# Patient Record
Sex: Female | Born: 1965
Health system: Southern US, Community
[De-identification: ages and names within clinical notes are randomized; demographics above are authoritative.]

## PROBLEM LIST (undated history)

## (undated) DIAGNOSIS — E119 Type 2 diabetes mellitus without complications: Secondary | ICD-10-CM

## (undated) DIAGNOSIS — E559 Vitamin D deficiency, unspecified: Secondary | ICD-10-CM

## (undated) DIAGNOSIS — M109 Gout, unspecified: Secondary | ICD-10-CM

## (undated) DIAGNOSIS — J45909 Unspecified asthma, uncomplicated: Secondary | ICD-10-CM

## (undated) DIAGNOSIS — I10 Essential (primary) hypertension: Secondary | ICD-10-CM

## (undated) DIAGNOSIS — E049 Nontoxic goiter, unspecified: Secondary | ICD-10-CM

## (undated) DIAGNOSIS — E7800 Pure hypercholesterolemia, unspecified: Secondary | ICD-10-CM

## (undated) HISTORY — PX: CHOLECYSTECTOMY: SHX55

## (undated) HISTORY — PX: TUBAL LIGATION: SHX77

## (undated) HISTORY — DX: Vitamin D deficiency, unspecified: E55.9

## (undated) HISTORY — DX: Essential (primary) hypertension: I10

## (undated) HISTORY — DX: Pure hypercholesterolemia, unspecified: E78.00

## (undated) HISTORY — DX: Type 2 diabetes mellitus without complications: E11.9

## (undated) HISTORY — PX: KNEE ARTHROSCOPY: SUR90

---
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
## 2000-09-26 ENCOUNTER — Inpatient Hospital Stay (HOSPITAL_COMMUNITY): Admission: AD | Admit: 2000-09-26 | Discharge: 2000-09-26 | Payer: Self-pay | Admitting: Obstetrics & Gynecology

## 2001-01-03 ENCOUNTER — Encounter: Payer: Self-pay | Admitting: Emergency Medicine

## 2001-01-04 ENCOUNTER — Encounter: Payer: Self-pay | Admitting: Family Medicine

## 2001-01-05 ENCOUNTER — Encounter: Payer: Self-pay | Admitting: Family Medicine

## 2001-01-05 ENCOUNTER — Inpatient Hospital Stay (HOSPITAL_COMMUNITY): Admission: EM | Admit: 2001-01-05 | Discharge: 2001-01-09 | Payer: Self-pay | Admitting: Emergency Medicine

## 2001-01-06 ENCOUNTER — Encounter (INDEPENDENT_AMBULATORY_CARE_PROVIDER_SITE_OTHER): Payer: Self-pay | Admitting: *Deleted

## 2001-01-06 ENCOUNTER — Encounter: Payer: Self-pay | Admitting: Family Medicine

## 2001-01-18 ENCOUNTER — Encounter: Admission: RE | Admit: 2001-01-18 | Discharge: 2001-01-18 | Payer: Self-pay | Admitting: Family Medicine

## 2003-02-09 ENCOUNTER — Encounter: Payer: Self-pay | Admitting: Emergency Medicine

## 2003-02-09 ENCOUNTER — Emergency Department (HOSPITAL_COMMUNITY): Admission: EM | Admit: 2003-02-09 | Discharge: 2003-02-09 | Payer: Self-pay | Admitting: Emergency Medicine

## 2003-05-30 ENCOUNTER — Encounter: Admission: RE | Admit: 2003-05-30 | Discharge: 2003-08-28 | Payer: Self-pay | Admitting: Unknown Physician Specialty

## 2008-12-21 ENCOUNTER — Encounter: Admission: RE | Admit: 2008-12-21 | Discharge: 2008-12-21 | Payer: Self-pay | Admitting: Internal Medicine

## 2011-02-13 NOTE — Op Note (Signed)
[place]. [facility]  Patient:    Brandi Howell, Brandi Howell                   MRN: [id number] Proc. Date: 01/06/01 Adm. Date:  [id number] Attending:  Sanjuana Letters CC:         [facility], Dr. Leveda Anna   Operative Report  PREOPERATIVE DIAGNOSIS:  Symptomatic cholelithiasis with abnormal liver function tests.  POSTOPERATIVE DIAGNOSIS:  Symptomatic cholelithiasis with abnormal liver function tests.  PROCEDURE:  Laparoscopic cholecystectomy with intraoperative cholangiogram.  SURGEON:  Jimmye Norman, M.D.  ASSISTANT:  Abigail Miyamoto, M.D.  ANESTHESIA:  General endotracheal.  ESTIMATED BLOOD LOSS:  75 cc.  COMPLICATIONS:  Distal tear of cystic duct.  CONDITION:  Stable.  FINDINGS:  The patient had a normal cholangiogram.  However, during the procedure, there was a tear in distal cystic duct.  This required clips closer to the common duct than usually would have been done.  However, it appeared to be free of any impingement or tenting of the common duct.  INDICATIONS FOR OPERATION:  The patient is a 45 year old, who was admitted with abdominal pain and chest pain thought to be possibly cardiac in origin and was subsequently found to have cholelithiasis and abnormal liver function tests.  A HIDA scan was normal.  She is now being treated for her symptomatic gallbladder disease.  DESCRIPTION OF PROCEDURE:  The patient was taken to the operating room, placed on table in the supine position.  After an adequate endotracheal anesthetic was administered, she was prepped and draped in usual sterile manner exposing the midline and the right upper quadrant of the abdomen.  A supraumbilical curvilinear incision was made using a #11-blade and it was taken down to the midline fascia, through which a Veress needle was passed into the peritoneal cavity and confirmed to be in position using the saline test.  This was done with the patient in reverse  Trendelenburg position.  Once the Veress needle was confirmed to be in position, we insufflated the abdomen with carbon dioxide gas upto a maximal intraabdominal pressure of 15 mmHg. Once this was done, an 1112 mm cannula and trocar was passed through the supraumbilical fascia into the peritoneal cavity and confirmed to be in adequate position using a laparoscope with attached camera and light source.  Once the supraumbilical cannula was in place, two right costal margin 5 mm cannulae and a subxiphoid 1011 mm cannula were passed into the peritoneal cavity under direct vision.  With all in place, the gallbladder surgery was then begun.  A ratcheted grasper was passed through the lateral-most 5 mm cannula and attached to the dome of the gallbladder.  Because of the patients size, it was difficult getting adequate visualization of the infundibulum.  However, we could see well enough so to in order to dissect out the peritoneum overlying the Triangle of Calot and hepatoduodenal triangle.  The cystic artery was encountered initially and it was external and anterior to the cystic duct.  We were able to isolate that and ligate it doubly proximally and distally and then subsequently transect it.  We then dissected out the cystic duct and subsequently clipped it along the gallbladder side, performed a cholecystoductotomy and passed a redic catheter through the anterior abdominal wall in the cholecystoductotomy in order to perform cholangiogram.  During the initial attempt to inflate the balloon, the balloon popped and then subsequently there was a saline leak distal to the insertion site of the  catheter.  We attempted cholangiogram.  However, there was too much leakage. We demonstrated that there was a hole in the cystic duct distally.  We were able to get the catheter distal to the leak area and perform cholangiogram. However, we had to place our clips far more distal because there continued  to be a bile leak in the distal cystic duct.  We did demonstrate that there was a long cystic duct remnant during the procedure and that the catheter tip did not enter into the common bile duct.  Once the cholangiogram was performed, we removed the cholangiocatheter and then clipped proximal to the area where there was biliary leakage.  There was no further leakage and no apparent impingement upon the common bile duct, which we saw circumferentially common bile duct.  We did not see the common bile duct circumferentially.  However, we did see the distal cystic duct circumferentially and showed that there was no impingement.  We transected the cystic duct and then subsequently dissected out the gallbladder from its bed with minimal difficulty.  We were able to bring it out through the supraumbilical fascia, having retrieved some stones and then we repaired the supraumbilical fascia using a figure-of-eight stitch of 0 Vicryl.  Once this was done, we placed a 19 mm Blake drain through the medial-most cannula at the subxiphoid side, brought it down and passed into Morisons pouch.  We cut it to the appropriate length and then brought it out through the lateral-most 5 mm cannula site.  This was because of the leakage of the distal cystic duct that we were attempting to drain if it should leak postoperatively.  All needle counts, sponge counts and instrument counts were correct.  Once we had put the drain in place, we secured it with a 3-0 nylon.  We then closed the skin using running subcuticular stitch of 4-0 Vicryl; 0.25% Marcaine with epinephrine was injected at all skin sites.   Sterile dressings were applied to the Steri-Strips and sutured wounds. DD:  01/06/01 TD:  01/06/01 Job: [id number]

## 2011-02-13 NOTE — Discharge Summary (Signed)
[place]. [facility]  Patient:    Brandi Howell, Brandi Howell                   MRN: [id number] Adm. Date:  [id number] Disc. Date: 01/09/01 Attending:  Sanjuana Letters                           Discharge Summary  DISCHARGE DIAGNOSIS:  Acute gallbladder disease with biliary colic.  PRINCIPAL PROCEDURE:  Laparoscopic cholecystectomy with intraoperative cholangiogram.  The patient also had a partial cardiac work-up for possible chest pain.  DISCHARGE MEDICATION:  Vicodin 1-2 every 4 hours p.r.n. for pain.  The Jackson-Pratt drain was removed prior to discharge.  Had no bilious drainage; however, it was placed because of possible leak from the distal cystic duct.  DIET ON DISCHARGE:  Regular.  CONDITION:  Stable.  Her wounds look fine.  No evidence of infection.  She is to return to see me in two weeks.  HISTORY OF PRESENT ILLNESS:  The patient was admitted on the 8th initially for a cardiac work-up for epigastric and chest pain and subsequently found to have abnormal liver function tests, where an ultrasound was done which showed thickened gallbladder wall and gallstones.  A HIDA scan did show visualization of the gallbladder; however, because of acute symptoms, the patient was taken to surgery the day after a HIDA scan, at which time laparoscopic cholecystectomy was performed.  During the procedure, the Reddick cholangiocath perforated the distal cystic duct; however, we were able to complete a cholangiogram showing flow proximally and distally.  We were able to get clips below what appeared to be the perforation in the cystic duct distally; however, JP drain was placed postoperatively which never drained any bile and was placed a Morisons pouch.  The drain was discontinued the day of the patients discharge.  Initial liver function tests, which had been mildly abnormal, increased up into the 300s, but then came back down into the normal range.  Her  bilirubin never was elevated and neither was her alkaline phosphatase.  At the time of discharge, she is awake, alert, and oriented, complaining of less abdominal pain, had a normal bowel movement, and her wounds looked fine.  She will return to see me in two weeks.DD:  01/09/01 TD:  01/09/01 Job: [id number]

## 2011-02-13 NOTE — H&P (Signed)
[place]. [facility]  Patient:    Brandi Howell, Brandi Howell                   MRN: [id number] Adm. Date:  [id number] Attending:  Sanjuana Letters                         History and Physical  CHIEF COMPLAINT: The patient is a 45 year old woman I was asked to see by the family practice service for symptomatic cholelithiasis.  HISTORY OF PRESENT ILLNESS: She was admitted on January 04, 2001 with abdominal pain and epigastric and chest pain, thought to perhaps be cardiac in origin; however, she ruled out for any cardiac disease.  Her liver function tests were noted to be abnormal and ultrasound was done which showed stones, and a HIDA scan done which did fill the gallbladder, and the patient is currently asymptomatic.  However, because of previous pain she would like to go ahead with cholecystectomy.  PAST SURGICAL HISTORY:  1. Bilateral tubal ligation.  2. Laparoscopic ablation of endometriosis.  PAST MEDICAL HISTORY: She is otherwise healthy.  She previously did have hypertension which needed to be treated but currently is on no medication.  MEDICATIONS: She does not take medications for heart disease.  FAMILY HISTORY: Hypertension.  PHYSICAL EXAMINATION: Her abdomen is distended.  ABDOMEN: Distended.  She has good bowel sounds and has no tenderness in the right upper quadrant or epigastrium.  LABORATORY DATA: Her liver function tests are mildly abnormal, with AST and ALT elevated.  Total bilirubin is normal.  Alkaline phosphatase is normal.  She has thickening of the gallbladder on ultrasound but a normal HIDA.  IMPRESSION: The likelihood is that the patient has biliary colic and maybe mild subacute cholecystitis.  PLAN: The plan is to perform laparoscopic cholecystectomy.  This will be done as soon as possible tomorrow.  She will be added on as a case after getting some preoperative prophylactic antibiotics. DD:  01/05/01 TD:  01/06/01 Job:  [id number]

## 2011-02-13 NOTE — Discharge Summary (Signed)
[place]. [facility]  Patient:    Brandi Howell, Brandi Howell                     MRN: [id number] Adm. Date:  01/04/01 Disc. Date: 01/09/01 Attending:  Durward Mallard L. Mardelle Matte, M.D. Dictator:   Ocie Doyne, M.D. CC:         Fayette Pho, M.D., [place], [place]  Jimmye Norman, M.D.   Discharge Summary  DISCHARGE DIAGNOSIS:  Symptomatic cholelithiasis, status post laparoscopic cholecystectomy.  DISCHARGE MEDICATIONS: 1. Zantac 75 mg p.o. p.r.n. 2. Vicodin 1-2 p.o. q.4h. p.r.n. pain #30 given.  PROCEDURES:  Laparoscopic cholecystectomy with intraoperative cholangiogram.  CONSULTATIONS:  Dr. Jimmye Norman, general surgeon.  HISTORY OF PRESENT ILLNESS:  This 45 year old African-American female with history of gastroesophageal reflux disease and lactose intolerance presented with chest pain.  She had been in her usual state of good health when she ate an ice cream sandwich, immediately after which she began having squeezing 10/10 chest pain accompanied by diaphoresis and shortness of breath.  The pain lasted for 30-40 minutes, until her arrival at the emergency department.  The pain radiated to her back but not the jaw, shoulder, or arm.  Her pain lessened slightly after receiving a GI cocktail.  She has a history of similar chest pain in the past lasting 5-10 minutes not associated with particular activities or eating, which can occur at rest.  This has been going on for approximately 2-3 months.  She takes Zantac occasionally for these symptoms with some relief.  She does not smoke or drink alcohol.  Her physical exam was unremarkable.  ADMISSION LABORATORY DATA:  BMET:  Sodium 138, potassium 4.4, chloride 108, bicarb 22, BUN 15, creatinine 0.8, glucose 111.  CBC:  WBC 8.9, H&H 11.7 and 35.9, platelets 226.  Cardiac enzymes:  CK total of 340, CK-MB 2.5, troponin less than 0.01.  EKG showed question ST depression in leads II, III, and aVF while the patient was pain free.   Portable chest x-ray showed no acute disease.  HOSPITAL COURSE: #1 - CARDIOVASCULAR:  The patient was admitted to telemetry and serial cardiac enzymes were followed, which were negative.  A followup EKG the next morning showed no changes.  Fasting lipid panel was collected.  Total cholesterol 189, triglycerides 142, HDL 37, LDL 124.  She was ruled out for cardiovascular cause of her chest pain.  #2 - GASTROINTESTINAL:  A complete metabolic panel the following morning revealed AST elevated at 130, ALT 159, alkaline phosphatase 69, and total bilirubin of 0.5.  The patient continued to deny use of alcohol.  A viral hepatitis panel was negative.  Amylase and lipase were within normal limits. The patients pain recurred after eating breakfast and an ultrasound of the gallbladder revealed a thickened gallbladder wall at 3 mm in size and numerous gallstones with no biliary ductal dilatation.  A radionuclide hepatobiliary scan was performed.  There was no evidence of cystic, ductal, or biliary obstruction.  A general surgeon, Dr. Lindie Spruce, was consulted to see the patient for symptomatic cholelithiasis and the patient elected to have cholecystectomy performed.  On January 06, 2001, she underwent laparoscopic cholecystectomy with intraoperative cholangiogram complicated by a distal tear of the cystic duct. The patient tolerated the procedure well and her postoperative course was unremarkable.  On postop day #2, her liver enzymes remained mildly elevated. AST 56, ALT 162, alkaline phosphatase 91, total bilirubin 0.6, direct bilirubin 0.1, indirect bilirubin 0.5, albumin 3.2, total protein 6.4.  By  postoperative day #3, she was tolerating a regular diet, had had a bowel movement, was ambulating, and pain was adequately controlled on p.o. medications.  She noted some mild erythema around one of the incisional sites. There was no drainage, no warmth, no tenderness to palpation, and she was discharged home  in stable condition.  FOLLOW-UP:  Follow-up will be with Dr. Lindie Spruce two weeks postoperatively.  The patient will call to schedule, and with Dr. Elesa Massed, her general practitioner, for hospital follow-up as needed. DD:  01/09/01 TD:  01/09/01 Job: [id number]

## 2011-07-14 ENCOUNTER — Ambulatory Visit (INDEPENDENT_AMBULATORY_CARE_PROVIDER_SITE_OTHER): Payer: BC Managed Care – PPO | Admitting: Vascular Surgery

## 2011-07-14 DIAGNOSIS — M79609 Pain in unspecified limb: Secondary | ICD-10-CM

## 2011-07-14 DIAGNOSIS — I70219 Atherosclerosis of native arteries of extremities with intermittent claudication, unspecified extremity: Secondary | ICD-10-CM

## 2011-08-04 ENCOUNTER — Encounter (HOSPITAL_BASED_OUTPATIENT_CLINIC_OR_DEPARTMENT_OTHER): Payer: BC Managed Care – PPO

## 2014-10-12 ENCOUNTER — Other Ambulatory Visit: Payer: Self-pay

## 2014-10-12 DIAGNOSIS — Z1231 Encounter for screening mammogram for malignant neoplasm of breast: Secondary | ICD-10-CM

## 2014-10-24 ENCOUNTER — Ambulatory Visit
Admission: RE | Admit: 2014-10-24 | Discharge: 2014-10-24 | Disposition: A | Discharge status: Home / Self Care | Payer: 59 | Source: Ambulatory Visit

## 2014-10-24 ENCOUNTER — Other Ambulatory Visit: Payer: Self-pay

## 2014-10-24 ENCOUNTER — Encounter (INDEPENDENT_AMBULATORY_CARE_PROVIDER_SITE_OTHER): Payer: Self-pay

## 2014-10-24 DIAGNOSIS — Z1231 Encounter for screening mammogram for malignant neoplasm of breast: Secondary | ICD-10-CM

## 2015-12-04 ENCOUNTER — Other Ambulatory Visit: Payer: Self-pay

## 2015-12-04 DIAGNOSIS — Z1231 Encounter for screening mammogram for malignant neoplasm of breast: Secondary | ICD-10-CM

## 2015-12-18 ENCOUNTER — Ambulatory Visit
Admission: RE | Admit: 2015-12-18 | Discharge: 2015-12-18 | Disposition: A | Discharge status: Home / Self Care | Payer: BLUE CROSS/BLUE SHIELD | Source: Ambulatory Visit

## 2015-12-18 DIAGNOSIS — Z1231 Encounter for screening mammogram for malignant neoplasm of breast: Secondary | ICD-10-CM

## 2016-01-15 DIAGNOSIS — I10 Essential (primary) hypertension: Secondary | ICD-10-CM | POA: Diagnosis not present

## 2016-01-15 DIAGNOSIS — Z0000 Encounter for general adult medical examination without abnormal findings: Secondary | ICD-10-CM | POA: Diagnosis not present

## 2016-01-15 DIAGNOSIS — E1165 Type 2 diabetes mellitus with hyperglycemia: Secondary | ICD-10-CM | POA: Diagnosis not present

## 2016-01-15 DIAGNOSIS — N390 Urinary tract infection, site not specified: Secondary | ICD-10-CM | POA: Diagnosis not present

## 2016-04-17 DIAGNOSIS — R609 Edema, unspecified: Secondary | ICD-10-CM | POA: Diagnosis not present

## 2016-04-17 DIAGNOSIS — I10 Essential (primary) hypertension: Secondary | ICD-10-CM | POA: Diagnosis not present

## 2016-04-17 DIAGNOSIS — E1165 Type 2 diabetes mellitus with hyperglycemia: Secondary | ICD-10-CM | POA: Diagnosis not present

## 2016-07-22 DIAGNOSIS — Z79899 Other long term (current) drug therapy: Secondary | ICD-10-CM | POA: Diagnosis not present

## 2016-07-22 DIAGNOSIS — I10 Essential (primary) hypertension: Secondary | ICD-10-CM | POA: Diagnosis not present

## 2016-07-22 DIAGNOSIS — E1165 Type 2 diabetes mellitus with hyperglycemia: Secondary | ICD-10-CM | POA: Diagnosis not present

## 2016-07-22 DIAGNOSIS — R609 Edema, unspecified: Secondary | ICD-10-CM | POA: Diagnosis not present

## 2016-08-19 DIAGNOSIS — Z6841 Body Mass Index (BMI) 40.0 and over, adult: Secondary | ICD-10-CM | POA: Diagnosis not present

## 2016-08-19 DIAGNOSIS — Z1389 Encounter for screening for other disorder: Secondary | ICD-10-CM | POA: Diagnosis not present

## 2016-08-19 DIAGNOSIS — E1165 Type 2 diabetes mellitus with hyperglycemia: Secondary | ICD-10-CM | POA: Diagnosis not present

## 2016-08-19 DIAGNOSIS — R609 Edema, unspecified: Secondary | ICD-10-CM | POA: Diagnosis not present

## 2016-10-05 DIAGNOSIS — J180 Bronchopneumonia, unspecified organism: Secondary | ICD-10-CM | POA: Diagnosis not present

## 2016-10-05 DIAGNOSIS — I517 Cardiomegaly: Secondary | ICD-10-CM | POA: Diagnosis not present

## 2016-10-05 DIAGNOSIS — R079 Chest pain, unspecified: Secondary | ICD-10-CM | POA: Diagnosis not present

## 2016-10-06 DIAGNOSIS — I517 Cardiomegaly: Secondary | ICD-10-CM | POA: Diagnosis not present

## 2016-10-23 DIAGNOSIS — I10 Essential (primary) hypertension: Secondary | ICD-10-CM | POA: Diagnosis not present

## 2016-10-23 DIAGNOSIS — R079 Chest pain, unspecified: Secondary | ICD-10-CM | POA: Diagnosis not present

## 2016-10-23 DIAGNOSIS — E7800 Pure hypercholesterolemia, unspecified: Secondary | ICD-10-CM | POA: Diagnosis not present

## 2016-10-23 DIAGNOSIS — R0602 Shortness of breath: Secondary | ICD-10-CM | POA: Diagnosis not present

## 2016-11-05 DIAGNOSIS — I10 Essential (primary) hypertension: Secondary | ICD-10-CM | POA: Diagnosis not present

## 2016-11-05 DIAGNOSIS — R0602 Shortness of breath: Secondary | ICD-10-CM | POA: Diagnosis not present

## 2017-03-11 ENCOUNTER — Other Ambulatory Visit: Payer: Self-pay | Admitting: Family Medicine

## 2017-03-11 ENCOUNTER — Other Ambulatory Visit: Payer: Self-pay | Admitting: Internal Medicine

## 2017-03-11 DIAGNOSIS — E1165 Type 2 diabetes mellitus with hyperglycemia: Secondary | ICD-10-CM | POA: Diagnosis not present

## 2017-03-11 DIAGNOSIS — M79602 Pain in left arm: Secondary | ICD-10-CM | POA: Diagnosis not present

## 2017-03-11 DIAGNOSIS — M791 Myalgia: Secondary | ICD-10-CM | POA: Diagnosis not present

## 2017-03-11 DIAGNOSIS — I10 Essential (primary) hypertension: Secondary | ICD-10-CM | POA: Diagnosis not present

## 2017-03-11 DIAGNOSIS — Z1231 Encounter for screening mammogram for malignant neoplasm of breast: Secondary | ICD-10-CM

## 2017-03-11 DIAGNOSIS — M79605 Pain in left leg: Secondary | ICD-10-CM | POA: Diagnosis not present

## 2017-03-16 DIAGNOSIS — N920 Excessive and frequent menstruation with regular cycle: Secondary | ICD-10-CM | POA: Diagnosis not present

## 2017-03-16 DIAGNOSIS — M25562 Pain in left knee: Secondary | ICD-10-CM | POA: Diagnosis not present

## 2017-03-16 DIAGNOSIS — Z124 Encounter for screening for malignant neoplasm of cervix: Secondary | ICD-10-CM | POA: Diagnosis not present

## 2017-03-16 DIAGNOSIS — Z1231 Encounter for screening mammogram for malignant neoplasm of breast: Secondary | ICD-10-CM | POA: Diagnosis not present

## 2017-03-16 DIAGNOSIS — Z01419 Encounter for gynecological examination (general) (routine) without abnormal findings: Secondary | ICD-10-CM | POA: Diagnosis not present

## 2017-03-23 ENCOUNTER — Ambulatory Visit: Payer: BLUE CROSS/BLUE SHIELD

## 2017-03-23 DIAGNOSIS — M9903 Segmental and somatic dysfunction of lumbar region: Secondary | ICD-10-CM | POA: Diagnosis not present

## 2017-03-23 DIAGNOSIS — M9902 Segmental and somatic dysfunction of thoracic region: Secondary | ICD-10-CM | POA: Diagnosis not present

## 2017-03-23 DIAGNOSIS — M545 Low back pain: Secondary | ICD-10-CM | POA: Diagnosis not present

## 2017-03-23 DIAGNOSIS — M9904 Segmental and somatic dysfunction of sacral region: Secondary | ICD-10-CM | POA: Diagnosis not present

## 2017-03-26 DIAGNOSIS — M9902 Segmental and somatic dysfunction of thoracic region: Secondary | ICD-10-CM | POA: Diagnosis not present

## 2017-03-26 DIAGNOSIS — M9904 Segmental and somatic dysfunction of sacral region: Secondary | ICD-10-CM | POA: Diagnosis not present

## 2017-03-26 DIAGNOSIS — M9903 Segmental and somatic dysfunction of lumbar region: Secondary | ICD-10-CM | POA: Diagnosis not present

## 2017-03-26 DIAGNOSIS — M545 Low back pain: Secondary | ICD-10-CM | POA: Diagnosis not present

## 2017-03-29 DIAGNOSIS — M25562 Pain in left knee: Secondary | ICD-10-CM | POA: Diagnosis not present

## 2017-03-29 DIAGNOSIS — M25462 Effusion, left knee: Secondary | ICD-10-CM | POA: Diagnosis not present

## 2017-03-29 DIAGNOSIS — S83242A Other tear of medial meniscus, current injury, left knee, initial encounter: Secondary | ICD-10-CM | POA: Diagnosis not present

## 2017-03-30 DIAGNOSIS — M545 Low back pain: Secondary | ICD-10-CM | POA: Diagnosis not present

## 2017-03-30 DIAGNOSIS — M9904 Segmental and somatic dysfunction of sacral region: Secondary | ICD-10-CM | POA: Diagnosis not present

## 2017-03-30 DIAGNOSIS — M9902 Segmental and somatic dysfunction of thoracic region: Secondary | ICD-10-CM | POA: Diagnosis not present

## 2017-03-30 DIAGNOSIS — M9903 Segmental and somatic dysfunction of lumbar region: Secondary | ICD-10-CM | POA: Diagnosis not present

## 2017-04-27 ENCOUNTER — Other Ambulatory Visit: Payer: Self-pay | Admitting: Orthopedic Surgery

## 2017-04-27 DIAGNOSIS — M25562 Pain in left knee: Secondary | ICD-10-CM

## 2017-05-08 ENCOUNTER — Ambulatory Visit
Admission: RE | Admit: 2017-05-08 | Discharge: 2017-05-08 | Disposition: A | Discharge status: Home / Self Care | Payer: BLUE CROSS/BLUE SHIELD | Source: Ambulatory Visit | Attending: Orthopedic Surgery | Admitting: Orthopedic Surgery

## 2017-05-08 DIAGNOSIS — M25562 Pain in left knee: Secondary | ICD-10-CM

## 2017-05-13 DIAGNOSIS — S83242D Other tear of medial meniscus, current injury, left knee, subsequent encounter: Secondary | ICD-10-CM | POA: Diagnosis not present

## 2017-05-13 DIAGNOSIS — M1711 Unilateral primary osteoarthritis, right knee: Secondary | ICD-10-CM | POA: Diagnosis not present

## 2017-06-03 DIAGNOSIS — E119 Type 2 diabetes mellitus without complications: Secondary | ICD-10-CM | POA: Diagnosis not present

## 2017-06-03 DIAGNOSIS — Z0000 Encounter for general adult medical examination without abnormal findings: Secondary | ICD-10-CM | POA: Diagnosis not present

## 2017-06-03 DIAGNOSIS — I10 Essential (primary) hypertension: Secondary | ICD-10-CM | POA: Diagnosis not present

## 2017-06-05 DIAGNOSIS — S83241A Other tear of medial meniscus, current injury, right knee, initial encounter: Secondary | ICD-10-CM | POA: Diagnosis not present

## 2017-07-21 ENCOUNTER — Ambulatory Visit (INDEPENDENT_AMBULATORY_CARE_PROVIDER_SITE_OTHER): Payer: BLUE CROSS/BLUE SHIELD | Admitting: Neurology

## 2017-07-21 ENCOUNTER — Encounter (INDEPENDENT_AMBULATORY_CARE_PROVIDER_SITE_OTHER): Payer: Self-pay

## 2017-07-21 ENCOUNTER — Encounter: Payer: Self-pay | Admitting: Neurology

## 2017-07-21 VITALS — BP 154/80 | HR 97 | Ht 63.0 in | Wt 308.0 lb

## 2017-07-21 DIAGNOSIS — E662 Morbid (severe) obesity with alveolar hypoventilation: Secondary | ICD-10-CM | POA: Diagnosis not present

## 2017-07-21 DIAGNOSIS — R351 Nocturia: Secondary | ICD-10-CM

## 2017-07-21 DIAGNOSIS — Z6841 Body Mass Index (BMI) 40.0 and over, adult: Secondary | ICD-10-CM

## 2017-07-21 DIAGNOSIS — G4719 Other hypersomnia: Secondary | ICD-10-CM | POA: Diagnosis not present

## 2017-07-21 DIAGNOSIS — R6889 Other general symptoms and signs: Secondary | ICD-10-CM | POA: Diagnosis not present

## 2017-07-21 DIAGNOSIS — R0683 Snoring: Secondary | ICD-10-CM

## 2017-07-21 DIAGNOSIS — J4520 Mild intermittent asthma, uncomplicated: Secondary | ICD-10-CM

## 2017-07-21 NOTE — Progress Notes (Signed)
SLEEP MED[facility]INE CLINIC   Provider:  Melvyn Novasarmen  Sanjuana Mruk, M D  Primary Care Physician:  Dorothyann PengSanders, Robyn, MD   Referring Provider: Dorothyann PengSanders, Robyn, MD    Chief Complaint  Patient presents with  . New Patient (Initial Visit)    pt alone rm 11. pt states that she wakes up with headaches, pt has been told she snores and sleep apnea runs in her family. pt states that she gets tired around 2 pm in afternoon.     HPI:  Brandi Howell is a 51 y.o. female , seen here as in a referral/ revisit  from Dr. Allyne GeeSanders for evaluation of sleep apnea.  Dr. Allyne GeeSanders referred this 51 year old African-American right-handed lady for asleep evaluation today on 07/21/2017. Brandi Howell has diabetes, morbid obesity, and is not perimenopausal yet. She carries also a diagnosis of essential hypertension, a hypercholesterolemia, and asthma.  Her main concern is that she becomes fatigued during the day feels sleepy and wakes up not rested and restored.She often wakes up with headaches,  dull throbbing headache that is present when she wakes up, but does not wake her out of sleep. She is sleepy while driving.   Sleep habits are as follows: She is usually watching TV before she goes to bed, and bedtime is around 11 PM, but she often has fallen asleep in her recliner watching TV. She then has difficulties going back to sleep in her bed. Her bedroom is cool quiet and dark.She usually falls asleep on multiple pillows in supine position almost reclined, but her husband has witnessed her to turn prone soon after 1 AM and sleep face down. She has to urinate every couple of hours , 3 times at night, and further wakes sometimes up without the urge to urinate, with a parched, dry mouth. She keeps a bottle of water next to her. She keeps a fan. She works from [employer] which means that she gets up at around 6.30 AM, Accompanied by a dull headache.She uses her phone is alarm, she does not wake up spontaneously. She has noted that if she can  sleep in ( on weekends)she gets a more severe headache. She does not take any naps during the day, and not on weekends either.   Sleep medical history and family sleep history:  Both parents and her daughter have OSA and CPAP.   Social history: Brandi Howell is married and the mother of 5 daughters, all adult. She has no grandchildren. She has no history of tobacco use, she does not drink alcohol, She drinks coffee 2-3 times a week, she drinks sodas 2-3 times a week. She also drinks ice tea for lunch, she does not exceed 2 caffeine beverages a day. No shift work history , she was always sleepy. 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   Review of Systems: Out of a complete 14 system review, the patient complains of only the following symptoms, and all other reviewed systems are negative.  The patient reports weight gain, fatigue, daytime sleepiness and snoring, morning headaches, swelling in both lower extremities, cough, wheezing and snoring, blurred vision, feeling hot at night, increased thirst. She endorsed apart medical history of hypertension, diabetes, high cholesterol and migraines as well as asthma,is status post tubal ligation, cholecystectomy and arthroscopic knee surgery on the right.  Epworth score 15/24  , Fatigue severity score 22  , depression score n/a    Social History   Social History  . Marital status: Married  Spouse name: N/A  . Number of children: N/A  . Years of education: N/A   Occupational History  . Not on file.   Social History Main Topics  . Smoking status: Not on file  . Smokeless tobacco: Not on file  . Alcohol use Not on file  . Drug use: Unknown  . Sexual activity: Not on file   Other Topics Concern  . Not on file   Social History Narrative  . No narrative on file    No family history on file.  Past Medical History:  Diagnosis Date  . Diabetes mellitus without complication (HCC)   . Hypercholesteremia   .  Hypertension   . Vitamin D deficiency     No past surgical history on file.  Current Outpatient Prescriptions  Medication Sig Dispense Refill  . allopurinol (ZYLOPRIM) 100 MG tablet TK 1 T PO  QD  5  . amLODipine-olmesartan (AZOR) 5-40 MG tablet Take 1 tablet by mouth daily.    . Arginine 500 MG CAPS Take 1 capsule by mouth 2 (two) times a week.    . furosemide (LASIX) 20 MG tablet TK 1 T PO D  5  . montelukast (SINGULAIR) 10 MG tablet TK 1 T PO QD  5  . Multiple Vitamins-Iron (MULTI-DAY PLUS IRON PO) Take by mouth.    Tula Nakayama 100-3.6 UNIT-MG/ML SOPN INJECT 30 UNITS SUBCUTANEOUSLY EVERY NIGHT  3   No current facility-administered medications for this visit.     Allergies as of 07/21/2017 - Review Complete 07/21/2017  Allergen Reaction Noted  . Lisinopril Cough 07/21/2017    Vitals: BP (!) 154/80   Pulse 97   Ht 5\' 3"  (1.6 m)   Wt (!) 308 lb (139.7 kg)   BMI 54.56 kg/m  Last Weight:  Wt Readings from Last 1 Encounters:  07/21/17 (!) 308 lb (139.7 kg)   WUJ:WJXB mass index is 54.56 kg/m.     Last Height:   Ht Readings from Last 1 Encounters:  07/21/17 5\' 3"  (1.6 m)    Physical exam:  General: The patient is awake, alert and appears not in acute distress. The patient is well groomed. Head: Normocephalic, atraumatic. Neck is supple. Mallampati 5  neck circumference:20 . Nasal airflow patent , TMJ click -not  evident . Retrognathia is seen. Biological teeth.  Cardiovascular:  Regular rate and rhythm , without  murmurs or carotid bruit, and without distended neck veins. Respiratory: Lungs are clear to auscultation. Skin:  Without evidence of edema, or rash Trunk: BMI is 55,    Neurologic exam : The patient is awake and alert, oriented to place and time.   Memory subjective described as intact.  Cranial nerves: Pupils are equal and briskly reactive to light. Funduscopic exam without evidence of pallor or edema. Extraocular movements  in vertical and horizontal  planes intact and without nystagmus. Visual fields by finger perimetry are intact. Hearing to finger rub intact.  Facial sensation intact to fine touch. Facial motor strength is symmetric and tongue and uvula move midline. Shoulder shrug was symmetrical.   Motor exam:  Normal tone, muscle bulk and symmetric strength in all extremities. Sensory:  Fine touch, pinprick and vibration were tested in all extremities. Proprioception tested in the upper extremities was normal. Coordination: Rapid alternating movements/  Finger-to-nose maneuver  normal without evidence of ataxia, dysmetria or tremor.  Gait and station: Patient walks without assistive device  Deep tendon reflexes: in the  upper and lower extremities are symmetric and  intact.    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 But she reportedly snores if at added symptom.  My concern is to establish if the patient may have obesity hypoventilation, which would be characterized as hypercapnia and hypoxemia during sleep. This may also explain her frequent morning headaches. These headaches light as the day goes on but are frequent companions in the morning.I would very much prefer to see the patient and attended sleep study with  hypercapnia measures, oximetry, and to split at an AHI of 20.  The patient was advised of the nature of the diagnosed disorder , the treatment options and the  risks for general health and wellness arising from not treating the condition.   I spent more than 50 minutes of face to face time with the patient.  Greater than 50% of time was spent in counseling and coordination of care. We have discussed the diagnosis and differential and I answered the patient's questions.    Plan:  Treatment plan and additional workup : SPLIT at AHI 20,  CO2 and SpO2 needed. I need max CO2 values, not averages- and total time at or above 50 torr.   Melvyn Novas, MD 07/21/2017, 3:10 PM  Certified in Neurology by ABPN Certified in Sleep Medicine by [facility] [address] [place], [place] [address]

## 2017-08-11 ENCOUNTER — Telehealth: Payer: Self-pay | Admitting: Neurology

## 2017-08-11 NOTE — Telephone Encounter (Signed)
We have attempted to call the patient 2 times to schedule sleep study. Patient has been unavailable at the phone numbers we have on file and has not returned our calls. At this point we will send a letter asking pt to please contact the sleep lab to schedule their sleep study. If patient calls back we will schedule them for their sleep study. ° °

## 2017-09-03 DIAGNOSIS — I10 Essential (primary) hypertension: Secondary | ICD-10-CM | POA: Diagnosis not present

## 2017-09-03 DIAGNOSIS — E119 Type 2 diabetes mellitus without complications: Secondary | ICD-10-CM | POA: Diagnosis not present

## 2017-09-03 DIAGNOSIS — J309 Allergic rhinitis, unspecified: Secondary | ICD-10-CM | POA: Diagnosis not present

## 2017-09-29 DIAGNOSIS — M545 Low back pain: Secondary | ICD-10-CM | POA: Diagnosis not present

## 2017-09-30 ENCOUNTER — Telehealth: Payer: Self-pay | Admitting: Neurology

## 2017-09-30 NOTE — Telephone Encounter (Signed)
We have attempted to call the patient 2 times to schedule sleep study. Patient has been unavailable at the phone numbers we have on file and has not returned our calls. At this point we will send a letter asking pt to please contact the sleep lab to schedule their sleep study. If patient calls back we will schedule them for their sleep study. ° °

## 2017-10-22 ENCOUNTER — Other Ambulatory Visit: Payer: Self-pay | Admitting: Orthopedic Surgery

## 2017-10-22 DIAGNOSIS — S83242A Other tear of medial meniscus, current injury, left knee, initial encounter: Secondary | ICD-10-CM | POA: Diagnosis present

## 2017-10-22 NOTE — H&P (Signed)
Brandi Howell is an 52 y.o. female.   Chief Complaint: Left Knee Pain  HPI: [name] is seen today for pain in her left knee which has a known medial meniscal tear, best seen on MRI scan that was done by Dr. Luiz BlareGraves.  Cortisone injection provided 1 week worth of pain relief.  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  In addition, she has known end-stage arthritis of the right knee, and it may require arthroplasty when her comorbidities are addressed.  Her BMI is over 50.  She is a diabetic with an A1c of 8.2, and also has a history of asthma that she says is well controlled.  Past Medical History:  Diagnosis Date  . Diabetes mellitus without complication (HCC)   . Hypercholesteremia   . Hypertension   . Vitamin D deficiency      No family history on file. Social History:  has no tobacco, alcohol, and drug history on file.  Allergies:  Allergies  Allergen Reactions  . Lisinopril Cough    No medications prior to admission.    No results found for this or any previous visit (from the past 48 hour(s)). No results found.  Review of Systems  Constitutional: Negative.   HENT: Positive for sinus pain.   Eyes: Negative.   Respiratory: Negative.   Cardiovascular:       HTN  Gastrointestinal: Negative.   Genitourinary: Negative.   Musculoskeletal: Positive for joint pain and myalgias.  Skin: Negative.   Neurological: Negative.   Endo/Heme/Allergies: Negative.   Psychiatric/Behavioral: Negative.     There were no vitals taken for this visit. Physical Exam  Constitutional: She is oriented to person, place, and time. She appears well-developed and well-nourished.  HENT:  Head: Normocephalic and atraumatic.  Eyes: Pupils are equal, round, and reactive to light.  Neck: Normal range of motion. Neck supple.  Cardiovascular: Intact distal pulses.  Respiratory: Effort normal.  Musculoskeletal: She exhibits tenderness.   Tender along the medial joint line of the left knee which lacks full extension.  Range of motion is 5/225 limited by adipose tissue.  McMurray's test reproduces pain with no palpable click.    Neurological: She is alert and oriented to person, place, and time.  Skin: Skin is warm and dry.  Psychiatric: She has a normal mood and affect. Her behavior is normal. Judgment and thought content normal.     Assessment/Plan Assess: Symptomatic medial meniscal tear and a 52 year old female who is failed conservative measures including observation, anti-inflammatory medicine and cortisone injection.  Plan: The risks and benefits of arthroscopic surgery discussed at length.  We will see her back at the time of surgical intervention which will include meniscectomy and debridement of chondromalacia.  Reviewing her chart she had a similar procedure on the right knee, in November 2014.    Dannielle BurnEric Tedrick Port, PA-C 10/22/2017, 11:37 AM

## 2017-10-22 NOTE — Progress Notes (Signed)
Pre-op instructions provided to pt according to pre-op checklist; please complete pt assessments on DOS. Pt made aware to stop taking  Aspirin,vitamins, fish oil, Arginine, Allegra-D and herbal medications. Do not take any NSAIDs ie: Ibuprofen, Advil, Naproxen (Aleve), Motrin, Excedrin Migraine, BC and Goody Powder or any medication containing Aspirin. Pt made aware to not take XULTOPHY injection DOS. Pt will have [facility] fax LOV note, CXR and EKG tomorrow. Pt made aware to check BG every 2 hours prior to arrival to hospital on DOS. Pt made aware to treat a BG < 70 with  4 ounces of apple or cranberry juice, wait 15 minutes after intervention to recheck BG, if BG remains < 70, call Short Stay unit to speak with a nurse. PT verbalized understanding of all pre-op instructions.

## 2017-10-25 ENCOUNTER — Encounter (HOSPITAL_COMMUNITY)
Admission: RE | Disposition: A | Discharge status: Home / Self Care | Payer: Self-pay | Source: Ambulatory Visit | Attending: Orthopedic Surgery

## 2017-10-25 ENCOUNTER — Other Ambulatory Visit: Payer: Self-pay

## 2017-10-25 ENCOUNTER — Ambulatory Visit (HOSPITAL_COMMUNITY): Payer: BLUE CROSS/BLUE SHIELD | Admitting: Certified Registered Nurse Anesthetist

## 2017-10-25 ENCOUNTER — Encounter (HOSPITAL_COMMUNITY): Payer: Self-pay | Admitting: *Deleted

## 2017-10-25 ENCOUNTER — Ambulatory Visit (HOSPITAL_COMMUNITY)
Admission: RE | Admit: 2017-10-25 | Discharge: 2017-10-25 | Disposition: A | Discharge status: Home / Self Care | Payer: BLUE CROSS/BLUE SHIELD | Source: Ambulatory Visit | Attending: Orthopedic Surgery | Admitting: Orthopedic Surgery

## 2017-10-25 DIAGNOSIS — J45909 Unspecified asthma, uncomplicated: Secondary | ICD-10-CM | POA: Diagnosis not present

## 2017-10-25 DIAGNOSIS — M94262 Chondromalacia, left knee: Secondary | ICD-10-CM | POA: Diagnosis not present

## 2017-10-25 DIAGNOSIS — I10 Essential (primary) hypertension: Secondary | ICD-10-CM | POA: Diagnosis not present

## 2017-10-25 DIAGNOSIS — Z79899 Other long term (current) drug therapy: Secondary | ICD-10-CM | POA: Insufficient documentation

## 2017-10-25 DIAGNOSIS — M23322 Other meniscus derangements, posterior horn of medial meniscus, left knee: Secondary | ICD-10-CM | POA: Diagnosis not present

## 2017-10-25 DIAGNOSIS — X58XXXA Exposure to other specified factors, initial encounter: Secondary | ICD-10-CM | POA: Diagnosis not present

## 2017-10-25 DIAGNOSIS — Y929 Unspecified place or not applicable: Secondary | ICD-10-CM | POA: Insufficient documentation

## 2017-10-25 DIAGNOSIS — E119 Type 2 diabetes mellitus without complications: Secondary | ICD-10-CM | POA: Diagnosis not present

## 2017-10-25 DIAGNOSIS — M1990 Unspecified osteoarthritis, unspecified site: Secondary | ICD-10-CM | POA: Diagnosis not present

## 2017-10-25 DIAGNOSIS — Z6841 Body Mass Index (BMI) 40.0 and over, adult: Secondary | ICD-10-CM | POA: Insufficient documentation

## 2017-10-25 DIAGNOSIS — Z7982 Long term (current) use of aspirin: Secondary | ICD-10-CM | POA: Diagnosis not present

## 2017-10-25 DIAGNOSIS — M1712 Unilateral primary osteoarthritis, left knee: Secondary | ICD-10-CM | POA: Diagnosis not present

## 2017-10-25 DIAGNOSIS — S83242A Other tear of medial meniscus, current injury, left knee, initial encounter: Secondary | ICD-10-CM | POA: Diagnosis not present

## 2017-10-25 DIAGNOSIS — E7800 Pure hypercholesterolemia, unspecified: Secondary | ICD-10-CM | POA: Diagnosis not present

## 2017-10-25 DIAGNOSIS — E559 Vitamin D deficiency, unspecified: Secondary | ICD-10-CM | POA: Insufficient documentation

## 2017-10-25 DIAGNOSIS — M2242 Chondromalacia patellae, left knee: Secondary | ICD-10-CM | POA: Diagnosis not present

## 2017-10-25 HISTORY — PX: KNEE ARTHROSCOPY: SHX127

## 2017-10-25 LAB — BASIC METABOLIC PANEL
ANION GAP: 14 (ref 5–15)
BUN: 8 mg/dL (ref 6–20)
CO2: 27 mmol/L (ref 22–32)
Calcium: 9.1 mg/dL (ref 8.9–10.3)
Chloride: 98 mmol/L — ABNORMAL LOW (ref 101–111)
Creatinine, Ser: 0.76 mg/dL (ref 0.44–1.00)
GFR calc Af Amer: >60 mL/min (ref >60)
GLUCOSE: 159 mg/dL — AB (ref 65–99)
POTASSIUM: 3.9 mmol/L (ref 3.5–5.1)
Sodium: 139 mmol/L (ref 135–145)

## 2017-10-25 LAB — HEMOGLOBIN A1C
Hgb A1c MFr Bld: 8.5 % — ABNORMAL HIGH (ref 4.8–5.6)
MEAN PLASMA GLUCOSE: 197.25 mg/dL

## 2017-10-25 LAB — CBC
HEMATOCRIT: 42.3 % (ref 36.0–46.0)
HEMOGLOBIN: 13 g/dL (ref 12.0–15.0)
MCH: 25 pg — ABNORMAL LOW (ref 26.0–34.0)
MCHC: 30.7 g/dL (ref 30.0–36.0)
MCV: 81.3 fL (ref 78.0–100.0)
Platelets: 185 10*3/uL (ref 150–400)
RBC: 5.2 MIL/uL — AB (ref 3.87–5.11)
RDW: 16.8 % — ABNORMAL HIGH (ref 11.5–15.5)
WBC: 7.3 10*3/uL (ref 4.0–10.5)

## 2017-10-25 LAB — GLUCOSE, CAPILLARY
GLUCOSE-CAPILLARY: 134 mg/dL — AB (ref 65–99)
Glucose-Capillary: 153 mg/dL — ABNORMAL HIGH (ref 65–99)

## 2017-10-25 LAB — POCT PREGNANCY, URINE: Preg Test, Ur: NEGATIVE

## 2017-10-25 SURGERY — ARTHROSCOPY, KNEE
Anesthesia: General | Site: Knee | Laterality: Left

## 2017-10-25 MED ORDER — FUROSEMIDE 10 MG/ML IJ SOLN
INTRAMUSCULAR | Status: AC
  Administered 2017-10-25: 10 mg
  Filled 2017-10-25: qty 4

## 2017-10-25 MED ORDER — FENTANYL CITRATE (PF) 100 MCG/2ML IJ SOLN
25.0000 ug | INTRAMUSCULAR | Status: DC | PRN
  Administered 2017-10-25: 25 ug via INTRAVENOUS
  Administered 2017-10-25: 50 ug via INTRAVENOUS

## 2017-10-25 MED ORDER — FUROSEMIDE 10 MG/ML IJ SOLN: 10.0000 mg | Freq: Once | INTRAMUSCULAR | Status: DC

## 2017-10-25 MED ORDER — LACTATED RINGERS IV SOLN
INTRAVENOUS | Status: DC
  Administered 2017-10-25: 11:00:00 via INTRAVENOUS

## 2017-10-25 MED ORDER — PROPOFOL 10 MG/ML IV BOLUS
INTRAVENOUS | Status: DC | PRN
  Administered 2017-10-25: 200 mg via INTRAVENOUS

## 2017-10-25 MED ORDER — FENTANYL CITRATE (PF) 250 MCG/5ML IJ SOLN
INTRAMUSCULAR | Status: AC
  Filled 2017-10-25: qty 5

## 2017-10-25 MED ORDER — BUPIVACAINE-EPINEPHRINE 0.5% -1:200000 IJ SOLN
INTRAMUSCULAR | Status: DC | PRN
  Administered 2017-10-25: 10 mL

## 2017-10-25 MED ORDER — PHENYLEPHRINE HCL 10 MG/ML IJ SOLN
INTRAMUSCULAR | Status: DC | PRN
  Administered 2017-10-25: 40 ug via INTRAVENOUS

## 2017-10-25 MED ORDER — ONDANSETRON HCL 4 MG/2ML IJ SOLN
INTRAMUSCULAR | Status: DC | PRN
  Administered 2017-10-25: 4 mg via INTRAVENOUS

## 2017-10-25 MED ORDER — DEXAMETHASONE SODIUM PHOSPHATE 4 MG/ML IJ SOLN
INTRAMUSCULAR | Status: DC | PRN
  Administered 2017-10-25: 5 mg via INTRAVENOUS

## 2017-10-25 MED ORDER — SODIUM CHLORIDE 0.9 % IR SOLN
Status: DC | PRN
  Administered 2017-10-25: 3000 mL

## 2017-10-25 MED ORDER — HYDROCODONE-ACETAMINOPHEN 5-325 MG PO TABS: 1.0000 | ORAL_TABLET | Freq: Four times a day (QID) | ORAL | 0 refills | Status: DC | PRN

## 2017-10-25 MED ORDER — CEFAZOLIN SODIUM-DEXTROSE 2-4 GM/100ML-% IV SOLN
2.0000 g | INTRAVENOUS | Status: AC
  Administered 2017-10-25: 2 g via INTRAVENOUS
  Filled 2017-10-25: qty 100

## 2017-10-25 MED ORDER — BUPIVACAINE-EPINEPHRINE (PF) 0.5% -1:200000 IJ SOLN
INTRAMUSCULAR | Status: AC
  Filled 2017-10-25: qty 30

## 2017-10-25 MED ORDER — STERILE WATER FOR IRRIGATION IR SOLN
Status: DC | PRN
  Administered 2017-10-25: 1000 mL

## 2017-10-25 MED ORDER — PROPOFOL 10 MG/ML IV BOLUS
INTRAVENOUS | Status: AC
  Filled 2017-10-25: qty 20

## 2017-10-25 MED ORDER — ONDANSETRON HCL 4 MG PO TABS: 4.0000 mg | ORAL_TABLET | Freq: Four times a day (QID) | ORAL | Status: DC | PRN

## 2017-10-25 MED ORDER — MIDAZOLAM HCL 2 MG/2ML IJ SOLN
INTRAMUSCULAR | Status: AC
  Filled 2017-10-25: qty 2

## 2017-10-25 MED ORDER — CHLORHEXIDINE GLUCONATE 4 % EX LIQD: 60.0000 mL | Freq: Once | CUTANEOUS | Status: DC

## 2017-10-25 MED ORDER — METOCLOPRAMIDE HCL 5 MG PO TABS: 5.0000 mg | ORAL_TABLET | Freq: Three times a day (TID) | ORAL | Status: DC | PRN

## 2017-10-25 MED ORDER — IPRATROPIUM-ALBUTEROL 0.5-2.5 (3) MG/3ML IN SOLN
3.0000 mL | Freq: Once | RESPIRATORY_TRACT | Status: AC
  Administered 2017-10-25: 3 mL via RESPIRATORY_TRACT

## 2017-10-25 MED ORDER — ONDANSETRON HCL 4 MG/2ML IJ SOLN: 4.0000 mg | Freq: Four times a day (QID) | INTRAMUSCULAR | Status: DC | PRN

## 2017-10-25 MED ORDER — LABETALOL HCL 5 MG/ML IV SOLN
10.0000 mg | Freq: Once | INTRAVENOUS | Status: AC
  Administered 2017-10-25: 15:00:00 via INTRAVENOUS

## 2017-10-25 MED ORDER — LABETALOL HCL 5 MG/ML IV SOLN
INTRAVENOUS | Status: AC
  Administered 2017-10-25: 10 mg
  Filled 2017-10-25: qty 4

## 2017-10-25 MED ORDER — MIDAZOLAM HCL 5 MG/5ML IJ SOLN
INTRAMUSCULAR | Status: DC | PRN
  Administered 2017-10-25: 2 mg via INTRAVENOUS

## 2017-10-25 MED ORDER — FENTANYL CITRATE (PF) 250 MCG/5ML IJ SOLN
INTRAMUSCULAR | Status: DC | PRN
  Administered 2017-10-25: 25 ug via INTRAVENOUS
  Administered 2017-10-25: 50 ug via INTRAVENOUS
  Administered 2017-10-25: 25 ug via INTRAVENOUS

## 2017-10-25 MED ORDER — FENTANYL CITRATE (PF) 100 MCG/2ML IJ SOLN
INTRAMUSCULAR | Status: AC
  Filled 2017-10-25: qty 2

## 2017-10-25 MED ORDER — EPINEPHRINE PF 1 MG/ML IJ SOLN
INTRAMUSCULAR | Status: AC
  Filled 2017-10-25: qty 1

## 2017-10-25 MED ORDER — LIDOCAINE HCL (CARDIAC) 20 MG/ML IV SOLN
INTRAVENOUS | Status: DC | PRN
  Administered 2017-10-25: 100 mg via INTRAVENOUS

## 2017-10-25 MED ORDER — LACTATED RINGERS IV SOLN
INTRAVENOUS | Status: DC | PRN
  Administered 2017-10-25: 11:00:00 via INTRAVENOUS

## 2017-10-25 MED ORDER — LABETALOL HCL 5 MG/ML IV SOLN
10.0000 mg | Freq: Once | INTRAVENOUS | Status: AC
  Administered 2017-10-25: 10 mg via INTRAVENOUS

## 2017-10-25 MED ORDER — EPINEPHRINE PF 1 MG/ML IJ SOLN
INTRAMUSCULAR | Status: DC | PRN
  Administered 2017-10-25: 1 mg

## 2017-10-25 MED ORDER — IPRATROPIUM-ALBUTEROL 0.5-2.5 (3) MG/3ML IN SOLN
RESPIRATORY_TRACT | Status: AC
  Administered 2017-10-25: 3 mL via RESPIRATORY_TRACT
  Filled 2017-10-25: qty 3

## 2017-10-25 MED ORDER — ONDANSETRON HCL 4 MG/2ML IJ SOLN: 4.0000 mg | Freq: Once | INTRAMUSCULAR | Status: DC | PRN

## 2017-10-25 MED ORDER — LABETALOL HCL 5 MG/ML IV SOLN
INTRAVENOUS | Status: AC
  Administered 2017-10-25: 10 mg via INTRAVENOUS
  Filled 2017-10-25: qty 4

## 2017-10-25 MED ORDER — METOCLOPRAMIDE HCL 5 MG/ML IJ SOLN: 5.0000 mg | Freq: Three times a day (TID) | INTRAMUSCULAR | Status: DC | PRN

## 2017-10-25 SURGICAL SUPPLY — 33 items
BANDAGE ACE 6X5 VEL STRL LF (GAUZE/BANDAGES/DRESSINGS) ×3 IMPLANT
BLADE CUDA 5.5 (BLADE) IMPLANT
BLADE CUTTER GATOR 3.5 (BLADE) ×3 IMPLANT
BLADE GREAT WHITE 4.2 (BLADE) IMPLANT
BLADE GREAT WHITE 4.2MM (BLADE)
BUR OVAL 6.0 (BURR) IMPLANT
DRAPE ARTHROSCOPY W/POUCH 114 (DRAPES) ×3 IMPLANT
DRSG PAD ABDOMINAL 8X10 ST (GAUZE/BANDAGES/DRESSINGS) ×3 IMPLANT
DURAPREP 26ML APPLICATOR (WOUND CARE) ×3 IMPLANT
GAUZE SPONGE 4X4 12PLY STRL (GAUZE/BANDAGES/DRESSINGS) ×3 IMPLANT
GAUZE XEROFORM 1X8 LF (GAUZE/BANDAGES/DRESSINGS) ×3 IMPLANT
GLOVE BIO SURGEON STRL SZ7.5 (GLOVE) ×3 IMPLANT
GLOVE BIO SURGEON STRL SZ8.5 (GLOVE) ×6 IMPLANT
GLOVE BIOGEL PI IND STRL 8 (GLOVE) ×2 IMPLANT
GLOVE BIOGEL PI IND STRL 9 (GLOVE) ×1 IMPLANT
GLOVE BIOGEL PI INDICATOR 8 (GLOVE) ×4
GLOVE BIOGEL PI INDICATOR 9 (GLOVE) ×2
GOWN STRL REUS W/ TWL LRG LVL3 (GOWN DISPOSABLE) ×3 IMPLANT
GOWN STRL REUS W/ TWL XL LVL3 (GOWN DISPOSABLE) ×2 IMPLANT
GOWN STRL REUS W/TWL LRG LVL3 (GOWN DISPOSABLE) ×6
GOWN STRL REUS W/TWL XL LVL3 (GOWN DISPOSABLE) ×4
KIT BASIN OR (CUSTOM PROCEDURE TRAY) ×3 IMPLANT
KIT ROOM TURNOVER OR (KITS) ×3 IMPLANT
MANIFOLD NEPTUNE II (INSTRUMENTS) ×3 IMPLANT
PACK ARTHROSCOPY DSU (CUSTOM PROCEDURE TRAY) ×3 IMPLANT
PAD ARMBOARD 7.5X6 YLW CONV (MISCELLANEOUS) ×6 IMPLANT
SET ARTHROSCOPY TUBING (MISCELLANEOUS) ×2
SET ARTHROSCOPY TUBING LN (MISCELLANEOUS) ×1 IMPLANT
SPONGE LAP 18X18 X RAY DECT (DISPOSABLE) ×3 IMPLANT
SPONGE LAP 4X18 X RAY DECT (DISPOSABLE) ×3 IMPLANT
SYR CONTROL 10ML LL (SYRINGE) ×3 IMPLANT
TOWEL OR 17X24 6PK STRL BLUE (TOWEL DISPOSABLE) ×3 IMPLANT
WATER STERILE IRR 1000ML POUR (IV SOLUTION) ×3 IMPLANT

## 2017-10-25 NOTE — Progress Notes (Signed)
Dr Desmond LopeUrk at bedside multiple times, Bipap on now, DR ROwan aware, Daughter in for visit and update, will also update Dr Desmond LopeUrk as to pts BP up

## 2017-10-25 NOTE — Anesthesia Preprocedure Evaluation (Addendum)
Anesthesia Evaluation  Patient identified by MRN, date of birth, ID band Patient awake    Reviewed: Allergy & Precautions, NPO status , Patient's Chart, lab work & pertinent test results  Airway Mallampati: II  TM Distance: >3 FB Neck ROM: Full    Dental  (+) Teeth Intact, Dental Advisory Given   Pulmonary asthma , sleep apnea ,    Pulmonary exam normal breath sounds clear to auscultation       Cardiovascular hypertension, Pt. on medications Normal cardiovascular exam Rhythm:Regular Rate:Normal     Neuro/Psych negative neurological ROS  negative psych ROS   GI/Hepatic negative GI ROS, Neg liver ROS, neg GERD  ,  Endo/Other  diabetes, Type obesity  Renal/GU negative Renal ROS     Musculoskeletal  (+) Arthritis , Osteoarthritis,    Abdominal   Peds  Hematology negative hematology ROS (+)   Anesthesia Other Findings Day of surgery medications reviewed with the patient.  Reproductive/Obstetrics                            Anesthesia Physical Anesthesia Plan  ASA: III  Anesthesia Plan: General   Post-op Pain Management:    Induction: Intravenous  PONV Risk Score and Plan: 3 and Dexamethasone and Ondansetron  Airway Management Planned: Oral ETT  Additional Equipment:   Intra-op Plan:   Post-operative Plan: Extubation in OR  Informed Consent: I have reviewed the patients History and Physical, chart, labs and discussed the procedure including the risks, benefits and alternatives for the proposed anesthesia with the patient or authorized representative who has indicated his/her understanding and acceptance.   Dental advisory given  Plan Discussed with: CRNA, Anesthesiologist and Surgeon  Anesthesia Plan Comments: (Risks/benefits of general anesthesia discussed with patient including risk of damage to teeth, lips, gum, and tongue, nausea/vomiting, allergic reactions to  medications, and the possibility of heart attack, stroke and death.  All patient questions answered.  Patient wishes to proceed.)       Anesthesia Quick Evaluation

## 2017-10-25 NOTE — Interval H&P Note (Signed)
History and Physical Interval Note:  10/25/2017 10:50 AM  Brandi Howell  has presented today for surgery, with the diagnosis of MEDIAL MENISCAL TEAR LEFT KNEE  The various methods of treatment have been discussed with the patient and family. After consideration of risks, benefits and other options for treatment, the patient has consented to  Procedure(s): LEFT KNEE ARTHROSCOPY WITH MEDIAL MENISECTOMY (Left) as a surgical intervention .  The patient's history has been reviewed, patient examined, no change in status, stable for surgery.  I have reviewed the patient's chart and labs.  Questions were answered to the patient's satisfaction.     Nestor LewandowskyFrank J Jermani Pund

## 2017-10-25 NOTE — Op Note (Signed)
Pre-Op Dx: Left knee medial meniscal tear, chondromalacia  Postop Dx: Same  Procedure: Left knee arthroscopic partial medial meniscectomy posterior horn and debridement chondromalacia medial femoral condyle grade 2-3  Surgeon: Feliberto GottronFrank J. Turner Danielsowan M.D.  Assist: Tomi LikensEric K. Gaylene BrooksPhillips PA-C  (present throughout entire procedure and necessary for timely completion of the procedure) Anes: General LMA  EBL: Minimal  Fluids: 800 cc   Indications: Catching popping and pain in the left knee similar to that she had in the right knee 3 years ago when she underwent arthroscopic partial medial meniscectomy.  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 Pt has failed conservative treatment with anti-inflammatory medicines, physical therapy, and modified activites but did get good temporarily from an intra-articular cortisone injection. Pain has recurred and patient desires elective arthroscopic evaluation and treatment of knee. Risks and benefits of surgery have been discussed and questions answered.  Procedure: Patient identified by arm band and taken to the operating room at the day surgery Center. The appropriate anesthetic monitors were attached, and General LMA anesthesia was induced without difficulty. Lateral post was applied to the table and the lower extremity was prepped and draped in usual sterile fashion from the ankle to the midthigh. Time out procedure was performed. We began the operation by making standard inferior lateral and inferior medial peripatellar portals with a #11 blade allowing introduction of the arthroscope through the inferior lateral portal and the out flow to the inferior medial portal. Pump pressure was set at 100 mmHg and diagnostic arthroscopy  revealed little if any chondromalacia of the patellofemoral joint.  Moving into the medial compartment identified a posterior horn radial tear of  the medial meniscus which was debrided back to a stable margin near the root.  Part of this was also parrot-beak 10 flipping in and out of the joint.  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  The ACL and the PCL are intact on the lateral side the patient did have a rather large lateral meniscus but it was not discoid.  In order to better visualize and contour I went ahead and removed the inner 20% of the anterior horn of the lateral meniscus.  The articular cartilages were in excellent condition there were no loose bodies.  The gutters were cleared medially and laterally.. The knee was irrigated out normal saline solution. A dressing of xerofoam 4 x 4 dressing sponges, web roll and an Ace wrap was applied. The patient was awakened extubated and taken to the recovery without difficulty.    Signed: Nestor LewandowskyFrank J Cherene Dobbins, MD

## 2017-10-25 NOTE — Anesthesia Postprocedure Evaluation (Signed)
Anesthesia Post Note  Patient: Brandi Howell  Procedure(s) Performed: LEFT KNEE ARTHROSCOPY WITH MEDIAL MENISECTOMY, CHONDROPLASTY (Left Knee)     Patient location during evaluation: PACU Anesthesia Type: General Level of consciousness: awake and alert Pain management: pain level controlled Vital Signs Assessment: post-procedure vital signs reviewed and stable Respiratory status: spontaneous breathing, nonlabored ventilation, respiratory function stable and patient connected to nasal cannula oxygen Cardiovascular status: blood pressure returned to baseline and stable Postop Assessment: no apparent nausea or vomiting Anesthetic complications: no Comments: PACU Course: low SpO2 readings despite patient denial of SOB, CP, difficulty breathing.  Received duoneb breathing treatment, Incentive spirometer, course of BiPAP, 1 dose of IV Lasix with improvement in SpO2 on room air.  Treated once for hypertension.  Discussion with patient about likely diagnosis of OSA.Marland Kitchen. Patient with sleep study scheduled next week.  Patient understands to continue use of IS and to take daily dose of anti-hypertensive medications once home.    Last Vitals:  Vitals:   10/25/17 1636 10/25/17 1640  BP: (!) 179/92   Pulse: 97   Resp: 14   Temp:  (!) 36.4 C  SpO2: 99%     Last Pain:  Vitals:   10/25/17 1534  TempSrc:   PainSc: 8                  Cecile HearingStephen Edward Merrilyn Legler

## 2017-10-25 NOTE — Progress Notes (Signed)
Patient SBP > 200, Dr. Desmond Lopeurk notified, new orders received. Labetalol given and BP rechecked with MD @ bedside. MD okay with current blood pressure at this time.

## 2017-10-25 NOTE — Anesthesia Procedure Notes (Signed)
Procedure Name: LMA Insertion Date/Time: 10/25/2017 11:38 AM Performed by: Margarita RanaHoltzman, Zerek Litsey Leffew, CRNA Pre-anesthesia Checklist: Patient identified, Emergency Drugs available, Suction available and Patient being monitored Patient Re-evaluated:Patient Re-evaluated prior to induction Oxygen Delivery Method: Circle System Utilized Preoxygenation: Pre-oxygenation with 100% oxygen Induction Type: IV induction Ventilation: Mask ventilation without difficulty LMA: LMA inserted LMA Size: 4.0 Number of attempts: 1 Airway Equipment and Method: Bite block Placement Confirmation: positive ETCO2 Tube secured with: Tape Dental Injury: Teeth and Oropharynx as per pre-operative assessment

## 2017-10-25 NOTE — Progress Notes (Signed)
SPoke with DR Desmond Lopeurk , sats [phone] , if pt maintains sats he states pt can go home

## 2017-10-25 NOTE — Transfer of Care (Signed)
Immediate Anesthesia Transfer of Care Note  Patient: Brandi Howell  Procedure(s) Performed: LEFT KNEE ARTHROSCOPY WITH MEDIAL MENISECTOMY (Left Knee)  Patient Location: PACU  Anesthesia Type:General  Level of Consciousness: awake, alert  and oriented  Airway & Oxygen Therapy: Patient Spontanous Breathing and Patient connected to nasal cannula oxygen  Post-op Assessment: Report given to RN, Post -op Vital signs reviewed and stable and Patient moving all extremities X 4  Post vital signs: Reviewed and stable  Last Vitals:  Vitals:   10/25/17 1116 10/25/17 1123  BP: (!) 215/104 (!) 177/79  Pulse:    Resp:    Temp:    SpO2:      Last Pain:  Vitals:   10/25/17 1112  TempSrc: Oral      Patients Stated Pain Goal: 2 (10/25/17 1055)  Complications: No apparent anesthesia complications

## 2017-10-26 ENCOUNTER — Encounter (HOSPITAL_COMMUNITY): Payer: Self-pay | Admitting: Orthopedic Surgery

## 2017-11-01 DIAGNOSIS — I10 Essential (primary) hypertension: Secondary | ICD-10-CM | POA: Diagnosis not present

## 2017-11-01 DIAGNOSIS — E1165 Type 2 diabetes mellitus with hyperglycemia: Secondary | ICD-10-CM | POA: Diagnosis not present

## 2017-11-01 DIAGNOSIS — Z79899 Other long term (current) drug therapy: Secondary | ICD-10-CM | POA: Diagnosis not present

## 2017-11-01 DIAGNOSIS — E041 Nontoxic single thyroid nodule: Secondary | ICD-10-CM | POA: Diagnosis not present

## 2017-11-07 ENCOUNTER — Ambulatory Visit (INDEPENDENT_AMBULATORY_CARE_PROVIDER_SITE_OTHER): Payer: BLUE CROSS/BLUE SHIELD | Admitting: Neurology

## 2017-11-07 DIAGNOSIS — G4733 Obstructive sleep apnea (adult) (pediatric): Secondary | ICD-10-CM

## 2017-11-07 DIAGNOSIS — E662 Morbid (severe) obesity with alveolar hypoventilation: Secondary | ICD-10-CM

## 2017-11-07 DIAGNOSIS — R6889 Other general symptoms and signs: Secondary | ICD-10-CM

## 2017-11-07 DIAGNOSIS — Z6841 Body Mass Index (BMI) 40.0 and over, adult: Secondary | ICD-10-CM

## 2017-11-07 DIAGNOSIS — G4719 Other hypersomnia: Secondary | ICD-10-CM

## 2017-11-07 DIAGNOSIS — R0683 Snoring: Secondary | ICD-10-CM

## 2017-11-07 DIAGNOSIS — R351 Nocturia: Secondary | ICD-10-CM

## 2017-11-08 ENCOUNTER — Other Ambulatory Visit: Payer: Self-pay | Admitting: Internal Medicine

## 2017-11-08 DIAGNOSIS — E041 Nontoxic single thyroid nodule: Secondary | ICD-10-CM

## 2017-11-14 ENCOUNTER — Other Ambulatory Visit: Payer: Self-pay | Admitting: Neurology

## 2017-11-14 DIAGNOSIS — E662 Morbid (severe) obesity with alveolar hypoventilation: Secondary | ICD-10-CM

## 2017-11-14 DIAGNOSIS — G4734 Idiopathic sleep related nonobstructive alveolar hypoventilation: Secondary | ICD-10-CM

## 2017-11-14 DIAGNOSIS — G4733 Obstructive sleep apnea (adult) (pediatric): Secondary | ICD-10-CM

## 2017-11-14 DIAGNOSIS — Z6841 Body Mass Index (BMI) 40.0 and over, adult: Secondary | ICD-10-CM

## 2017-11-14 NOTE — Procedures (Signed)
PATIENT'S NAME:  Brandi Howell, Brandi Howell DOB:      1966-07-20      MR#:    [id number]     DATE OF RECORDING: 11/07/2017 REFERRING M.D.:  Dorothyann Peng, M.D. Study Performed:  Split-Night Titration Study HISTORY:  Dr. Allyne Gee referred this 52 year old African-American right-handed lady for a sleep evaluation on 07/21/2017. [name] has diabetes, morbid obesity, gout, essential hypertension, hypercholesterolemia, and asthma. Her main concern is that she becomes very fatigued during the day, feels excessively daytime sleepy and wakes up not rested and restored. She often wakes up with headaches, dull throbbing headache that is present when she wakes up, but does not wake her out of sleep. She is very thirsty and sleepy while driving. The patient endorsed the Epworth Sleepiness Scale at 15/24 points, The FSS at 22 points,   The patient's weight 308 pounds with a height of 63 (inches), resulting in a BMI of 54.7 kg/m2. The patient's neck circumference measured 20 inches.  CURRENT MEDICATIONS: Allopurinol, Amlodipine, Arginine, Furosemide, Montelukast, and Xultophy  PROCEDURE:  This is a multichannel digital polysomnogram utilizing the Somnostar 11.2 system.  Electrodes and sensors were applied and monitored per AASM Specifications.   EEG, EOG, Chin and Limb EMG, were sampled at 200 Hz.  ECG, Snore and Nasal Pressure, Thermal Airflow, Respiratory Effort, CPAP Flow and Pressure, Oximetry was sampled at 50 Hz. Digital video and audio were recorded.      BASELINE STUDY WITHOUT CPAP RESULTS:Lights Out was at 22:34 and Lights On at 05:24.  Total recording time (TRT) was 250.5, with a total sleep time (TST) of 83 minutes.   The patient's sleep latency was 80 minutes.  REM latency was 0 minutes.  The sleep efficiency was 33.1 %.    SLEEP ARCHITECTURE: WASO (Wake after sleep onset) was 117.5 minutes, Stage N1 was 6 minutes, Stage N2 was 77 minutes, Stage N3 was 0 minutes and Stage R (REM sleep) was 0 minutes.   The percentages were Stage N1 7.2%, Stage N2 92.8%, Stage N3 0% and Stage R (REM sleep) 0%.   RESPIRATORY ANALYSIS:  There were a total of 204 respiratory events:  80 obstructive apneas, 0 central apneas and 0 mixed apneas with a total of 80 apneas and an apnea index (AI) of 57.8. There were 124 hypopneas with a hypopnea index of 89.6. The patient also had 0 respiratory event related arousals (RERAs).  Snoring was noted.    The total APNEA/HYPOPNEA INDEX (AHI) was 147.5 /hour and the total RESPIRATORY DISTURBANCE INDEX was 147.5 /hour in all NREM sleep. The patient spent all sleep time in the non-supine position with a non-supine AHI of 147.4 /hour.  OXYGEN SATURATION & C02:  The wake baseline 02 saturation was 88%, with the lowest being 66%. Time spent below 89% saturation equaled 80 minutes.  PERIODIC LIMB MOVEMENTS:   The patient had a total of 0 Periodic Limb Movements.  The arousals were noted as: 4 were spontaneous, 0 were associated with PLMs, and 204 were associated with respiratory events. The patient took 3 bathroom breaks. The patient looked frequently at her cell phone.  Loud Snoring was noted .EKG was in keeping with normal sinus rhythm (NSR)   TITRATION STUDY WITH CPAP RESULTS: CPAP was initiated due to the severity of this patient's sleep apnea and associated hypoxemia. The patient tried 3 types of mask, could finally settle with a full face mask.    CPAP was initiated at 5 cmH20 with heated humidity per AASM split  night standards and pressure was advanced to 7 cmH20 because of hypopneas, apneas and desaturations.  At a PAP pressure of 7.0 cmH20, there was a reduction of the AHI to 0.0 /hour. Sleep efficiency rose to 81% and Oxygen nadir to 82%.   Total recording time (TRT) was 160 minutes, with a total sleep time (TST) of 61.5 minutes. The patient's sleep latency was 32.5 minutes. REM latency was 0 minutes.  The sleep efficiency was 38.4 %.    SLEEP ARCHITECTURE: Wake after sleep  was 61 minutes, Stage N1 5 minutes, Stage N2 56.5 minutes, Stage N3 0 minutes and Stage R (REM sleep) 0 minutes. The percentages were: Stage N1 8.1%, Stage N2 91.9%, Stage N3 0% and Stage R (REM sleep) 0%.  The arousals were noted as: 5 were spontaneous, 0 were associated with PLMs, and 25 were associated with respiratory events. Please note that several arousals were directly correlated with hypoxia.   RESPIRATORY ANALYSIS:  There were a total of 26 respiratory events: 26 hypopneas with 0 respiratory event related arousals (RERAs).     The total APNEA/HYPOPNEA INDEX (AHI) was 25.4 /hour and the total RESPIRATORY DISTURBANCE INDEX was 25.4 /hour. 0 events occurred in REM sleep and 26 events in NREM. The REM AHI was 0 /hour versus a non-REM AHI of 25.4 /hour. REM sleep was not achieved .The patient spent 4% of total sleep time in the supine position. The supine AHI was 48.0 /hour, versus a non-supine AHI of 24.4/hour.  OXYGEN SATURATION & C02:  The wake baseline 02 saturation was 91%, with the lowest being 74%. Time spent below 89% saturation equaled 51 minutes.  PERIODIC LIMB MOVEMENTS:   The patient had a total of 0 Periodic Limb Movements.  Post-study, the patient indicated that sleep in the last part of the study was better than usual.  POLYSOMNOGRAPHY IMPRESSION :   1. Most Severe Obstructive Sleep Apnea(OSA), leading to severely fragmented sleep. 2. Very severe Hypoxia, by SpO2 Nadir, total desaturation time, and length of desaturation events. 3. Obesity hypoventilation syndrome- may need arterial Co2 measures to confirm.  4.  Primary Snoring   RECOMMENDATIONS:  1. An optimal treatment pressure was not identified in this study.  Advise full-night, attended, PAP titration Study with option to change to BiPAP and option to add Oxygen to optimize therapy.   2. A Full Face Mask model Respironics Dream wear in Medium size was used with heated humidity during this study.  Advise to add heated  humidity.  Adjust interface and heated humidity as needed.     3. Avoid alcohol, tobacco, and sedative-hypnotics which may worsen sleep apnea (as applicable). 4. Advise urgently to lose weight, by diet and exercise or bariatric surgery (BMI is 54.7). 5. Advise patient to avoid driving or operating hazardous machinery when sleepy. 6. Check thyroid function to rule out hypothyroidism as clinically indicated. 7. Oxygen therapy at 2-4 l/min. may be of help if the patient fails to tolerate PAP therapy. 8. Sleep hygiene needs to be discussed, avoiding screen light, noises, and caffeine in PM. Consider psychology referral if insomnia is of clinical concern.   9. A follow up [facility].      I certify that I have reviewed the entire raw data recording prior to the issuance of this report in accordance with the Standards of Accreditation of the American Academy of Sleep Medicine (AASM)    Melvyn Novasarmen Shaela Boer, M.D.  11-13-2017  Diplomat, American Board of Psychiatry and Neurology  Diplomat, Biomedical engineer of Sleep Medicine Wellsite geologist, [employer] [facility] [employer]

## 2017-11-15 ENCOUNTER — Other Ambulatory Visit: Payer: BLUE CROSS/BLUE SHIELD

## 2017-11-15 ENCOUNTER — Telehealth: Payer: Self-pay | Admitting: Neurology

## 2017-11-15 NOTE — Telephone Encounter (Signed)
Pt has returned the call of RN Baird LyonsCasey, please call back

## 2017-11-15 NOTE — Telephone Encounter (Signed)
Called patient to discuss sleep study results. No answer at this time. LVM for the patient to call back.   

## 2017-11-15 NOTE — Telephone Encounter (Signed)
-----   Message from Melvyn Novasarmen Dohmeier, MD sent at 11/14/2017  6:30 PM EST ----- Obesity hypoventilation patient with the severest sleep apnea seen in a long time, severe hypoxemia and severely fragmented sleep. Needs to return for a full night PAP titration - if CPAP is  not helping to raise the SpO2 nadir, may  titrate to oxygen. This needs to be documented for home oxygen prescription.  may try BiPAP for tolerability - FFM was best choice for patient's comfort.    Cc Dr Allyne GeeSanders

## 2017-11-15 NOTE — Telephone Encounter (Signed)
I called pt. I advised pt that Dr. Vickey Hugerohmeier reviewed their sleep study results and found that the pt has severe sleep apnea and recommends that pt be treated with a cpap. Dr. Vickey Hugerohmeier recommends that pt return for a repeat sleep study in order to properly titrate the cpap and ensure a good mask fit. Pt is agreeable to returning for a titration study. I advised pt that our sleep lab will file with pt's insurance and call pt to schedule the sleep study when we hear back from the pt's insurance regarding coverage of this sleep study. Pt verbalized understanding of results. Pt had no questions at this time but was encouraged to call back if questions arise.

## 2017-11-18 ENCOUNTER — Other Ambulatory Visit: Payer: BLUE CROSS/BLUE SHIELD

## 2017-11-23 ENCOUNTER — Telehealth: Payer: Self-pay | Admitting: Neurology

## 2017-11-23 NOTE — Telephone Encounter (Signed)
The pt needed a soon as possible appt due to insurance elapsing the first of March. On 11/16/17 pt was called and offered an appt for 11/25/17. The pt stated she would call back. The pt never called back. On 11/18/17 a voicemail was left letting the pt know the appt slot was still being held for her and I needed to know by the end of the day if she would be able to take the appt. I never heard anything back from the pt. The pt never was scheduled.

## 2017-11-25 ENCOUNTER — Ambulatory Visit
Admission: RE | Admit: 2017-11-25 | Discharge: 2017-11-25 | Disposition: A | Discharge status: Home / Self Care | Payer: BLUE CROSS/BLUE SHIELD | Source: Ambulatory Visit | Attending: Internal Medicine | Admitting: Internal Medicine

## 2017-11-25 DIAGNOSIS — E042 Nontoxic multinodular goiter: Secondary | ICD-10-CM | POA: Diagnosis not present

## 2017-11-25 DIAGNOSIS — E041 Nontoxic single thyroid nodule: Secondary | ICD-10-CM

## 2017-12-16 ENCOUNTER — Other Ambulatory Visit: Payer: Self-pay | Admitting: Internal Medicine

## 2017-12-16 DIAGNOSIS — E041 Nontoxic single thyroid nodule: Secondary | ICD-10-CM

## 2018-01-20 ENCOUNTER — Other Ambulatory Visit (HOSPITAL_COMMUNITY)
Admission: RE | Admit: 2018-01-20 | Discharge: 2018-01-20 | Disposition: A | Discharge status: Home / Self Care | Payer: BLUE CROSS/BLUE SHIELD | Source: Ambulatory Visit | Attending: Radiology | Admitting: Radiology

## 2018-01-20 ENCOUNTER — Ambulatory Visit
Admission: RE | Admit: 2018-01-20 | Discharge: 2018-01-20 | Disposition: A | Discharge status: Home / Self Care | Payer: BLUE CROSS/BLUE SHIELD | Source: Ambulatory Visit | Attending: Internal Medicine | Admitting: Internal Medicine

## 2018-01-20 DIAGNOSIS — E041 Nontoxic single thyroid nodule: Secondary | ICD-10-CM

## 2018-01-20 DIAGNOSIS — E042 Nontoxic multinodular goiter: Secondary | ICD-10-CM | POA: Diagnosis not present

## 2018-02-02 DIAGNOSIS — I10 Essential (primary) hypertension: Secondary | ICD-10-CM | POA: Diagnosis not present

## 2018-02-02 DIAGNOSIS — Z79899 Other long term (current) drug therapy: Secondary | ICD-10-CM | POA: Diagnosis not present

## 2018-02-02 DIAGNOSIS — E1165 Type 2 diabetes mellitus with hyperglycemia: Secondary | ICD-10-CM | POA: Diagnosis not present

## 2018-02-02 DIAGNOSIS — Z1389 Encounter for screening for other disorder: Secondary | ICD-10-CM | POA: Diagnosis not present

## 2018-02-02 DIAGNOSIS — E042 Nontoxic multinodular goiter: Secondary | ICD-10-CM | POA: Diagnosis not present

## 2018-02-17 DIAGNOSIS — I10 Essential (primary) hypertension: Secondary | ICD-10-CM | POA: Diagnosis not present

## 2018-02-17 DIAGNOSIS — J45909 Unspecified asthma, uncomplicated: Secondary | ICD-10-CM | POA: Diagnosis not present

## 2018-02-19 ENCOUNTER — Encounter (HOSPITAL_COMMUNITY): Payer: Self-pay | Admitting: Emergency Medicine

## 2018-02-19 ENCOUNTER — Other Ambulatory Visit: Payer: Self-pay

## 2018-02-19 ENCOUNTER — Inpatient Hospital Stay (HOSPITAL_COMMUNITY)
Admission: EM | Admit: 2018-02-19 | Discharge: 2018-02-22 | DRG: 291 | Disposition: A | Discharge status: Home / Self Care | Payer: BLUE CROSS/BLUE SHIELD | Attending: Family Medicine | Admitting: Family Medicine

## 2018-02-19 ENCOUNTER — Emergency Department (HOSPITAL_COMMUNITY): Payer: BLUE CROSS/BLUE SHIELD

## 2018-02-19 DIAGNOSIS — I2699 Other pulmonary embolism without acute cor pulmonale: Secondary | ICD-10-CM | POA: Diagnosis present

## 2018-02-19 DIAGNOSIS — E049 Nontoxic goiter, unspecified: Secondary | ICD-10-CM

## 2018-02-19 DIAGNOSIS — I5030 Unspecified diastolic (congestive) heart failure: Secondary | ICD-10-CM | POA: Diagnosis not present

## 2018-02-19 DIAGNOSIS — E872 Acidosis: Secondary | ICD-10-CM | POA: Diagnosis not present

## 2018-02-19 DIAGNOSIS — Z79899 Other long term (current) drug therapy: Secondary | ICD-10-CM

## 2018-02-19 DIAGNOSIS — J45909 Unspecified asthma, uncomplicated: Secondary | ICD-10-CM | POA: Diagnosis present

## 2018-02-19 DIAGNOSIS — E7800 Pure hypercholesterolemia, unspecified: Secondary | ICD-10-CM | POA: Diagnosis present

## 2018-02-19 DIAGNOSIS — E785 Hyperlipidemia, unspecified: Secondary | ICD-10-CM | POA: Diagnosis not present

## 2018-02-19 DIAGNOSIS — M109 Gout, unspecified: Secondary | ICD-10-CM | POA: Diagnosis not present

## 2018-02-19 DIAGNOSIS — J4540 Moderate persistent asthma, uncomplicated: Secondary | ICD-10-CM | POA: Diagnosis not present

## 2018-02-19 DIAGNOSIS — E1165 Type 2 diabetes mellitus with hyperglycemia: Secondary | ICD-10-CM | POA: Diagnosis present

## 2018-02-19 DIAGNOSIS — I248 Other forms of acute ischemic heart disease: Secondary | ICD-10-CM | POA: Diagnosis present

## 2018-02-19 DIAGNOSIS — J9601 Acute respiratory failure with hypoxia: Secondary | ICD-10-CM | POA: Diagnosis not present

## 2018-02-19 DIAGNOSIS — I509 Heart failure, unspecified: Secondary | ICD-10-CM

## 2018-02-19 DIAGNOSIS — E1169 Type 2 diabetes mellitus with other specified complication: Secondary | ICD-10-CM

## 2018-02-19 DIAGNOSIS — J9602 Acute respiratory failure with hypercapnia: Secondary | ICD-10-CM | POA: Diagnosis not present

## 2018-02-19 DIAGNOSIS — M7989 Other specified soft tissue disorders: Secondary | ICD-10-CM | POA: Diagnosis not present

## 2018-02-19 DIAGNOSIS — I5031 Acute diastolic (congestive) heart failure: Secondary | ICD-10-CM

## 2018-02-19 DIAGNOSIS — Z888 Allergy status to other drugs, medicaments and biological substances status: Secondary | ICD-10-CM | POA: Diagnosis not present

## 2018-02-19 DIAGNOSIS — E559 Vitamin D deficiency, unspecified: Secondary | ICD-10-CM | POA: Diagnosis present

## 2018-02-19 DIAGNOSIS — J811 Chronic pulmonary edema: Secondary | ICD-10-CM | POA: Diagnosis not present

## 2018-02-19 DIAGNOSIS — R079 Chest pain, unspecified: Secondary | ICD-10-CM | POA: Diagnosis not present

## 2018-02-19 DIAGNOSIS — J189 Pneumonia, unspecified organism: Secondary | ICD-10-CM | POA: Diagnosis not present

## 2018-02-19 DIAGNOSIS — Z6841 Body Mass Index (BMI) 40.0 and over, adult: Secondary | ICD-10-CM | POA: Diagnosis not present

## 2018-02-19 DIAGNOSIS — I10 Essential (primary) hypertension: Secondary | ICD-10-CM

## 2018-02-19 DIAGNOSIS — E662 Morbid (severe) obesity with alveolar hypoventilation: Secondary | ICD-10-CM | POA: Diagnosis present

## 2018-02-19 DIAGNOSIS — E119 Type 2 diabetes mellitus without complications: Secondary | ICD-10-CM

## 2018-02-19 DIAGNOSIS — I110 Hypertensive heart disease with heart failure: Principal | ICD-10-CM | POA: Diagnosis present

## 2018-02-19 HISTORY — DX: Gout, unspecified: M10.9

## 2018-02-19 HISTORY — DX: Unspecified asthma, uncomplicated: J45.909

## 2018-02-19 HISTORY — DX: Nontoxic goiter, unspecified: E04.9

## 2018-02-19 LAB — BRAIN NATRIURETIC PEPTIDE: B Natriuretic Peptide: 123.6 pg/mL — ABNORMAL HIGH (ref 0.0–100.0)

## 2018-02-19 LAB — GLUCOSE, CAPILLARY
Glucose-Capillary: 208 mg/dL — ABNORMAL HIGH (ref 65–99)
Glucose-Capillary: 223 mg/dL — ABNORMAL HIGH (ref 65–99)

## 2018-02-19 LAB — CBC
HCT: 39.5 % (ref 36.0–46.0)
Hemoglobin: 11.5 g/dL — ABNORMAL LOW (ref 12.0–15.0)
MCH: 24.6 pg — ABNORMAL LOW (ref 26.0–34.0)
MCHC: 29.1 g/dL — ABNORMAL LOW (ref 30.0–36.0)
MCV: 84.6 fL (ref 78.0–100.0)
PLATELETS: 241 10*3/uL (ref 150–400)
RBC: 4.67 MIL/uL (ref 3.87–5.11)
RDW: 18.4 % — AB (ref 11.5–15.5)
WBC: 13.3 10*3/uL — AB (ref 4.0–10.5)

## 2018-02-19 LAB — HEMOGLOBIN A1C
HEMOGLOBIN A1C: 8 % — AB (ref 4.8–5.6)
Mean Plasma Glucose: 182.9 mg/dL

## 2018-02-19 LAB — BASIC METABOLIC PANEL
Anion gap: 11 (ref 5–15)
BUN: 21 mg/dL — AB (ref 6–20)
CALCIUM: 8.8 mg/dL — AB (ref 8.9–10.3)
CHLORIDE: 102 mmol/L (ref 101–111)
CO2: 29 mmol/L (ref 22–32)
CREATININE: 1.08 mg/dL — AB (ref 0.44–1.00)
GFR calc Af Amer: >60 mL/min (ref >60)
GFR, EST NON AFRICAN AMERICAN: 58 mL/min — AB (ref >60)
Glucose, Bld: 252 mg/dL — ABNORMAL HIGH (ref 65–99)
Potassium: 3.8 mmol/L (ref 3.5–5.1)
SODIUM: 142 mmol/L (ref 135–145)

## 2018-02-19 LAB — I-STAT TROPONIN, ED: TROPONIN I, POC: 0.1 ng/mL — AB (ref 0.00–0.08)

## 2018-02-19 LAB — TSH: TSH: 0.498 u[IU]/mL (ref 0.350–4.500)

## 2018-02-19 LAB — TROPONIN I
TROPONIN I: 0.2 ng/mL — AB (ref <0.03)
Troponin I: 0.23 ng/mL (ref <0.03)

## 2018-02-19 MED ORDER — IRBESARTAN 300 MG PO TABS
300.0000 mg | ORAL_TABLET | Freq: Every day | ORAL | Status: DC
  Administered 2018-02-20: 300 mg via ORAL
  Filled 2018-02-19: qty 1

## 2018-02-19 MED ORDER — ONDANSETRON HCL 4 MG/2ML IJ SOLN: 4.0000 mg | Freq: Four times a day (QID) | INTRAMUSCULAR | Status: DC | PRN

## 2018-02-19 MED ORDER — NITROGLYCERIN 2 % TD OINT
1.0000 [in_us] | TOPICAL_OINTMENT | Freq: Four times a day (QID) | TRANSDERMAL | Status: DC
  Filled 2018-02-19: qty 30

## 2018-02-19 MED ORDER — INSULIN ASPART 100 UNIT/ML ~~LOC~~ SOLN
0.0000 [IU] | Freq: Three times a day (TID) | SUBCUTANEOUS | Status: DC
  Administered 2018-02-20: 1 [IU] via SUBCUTANEOUS

## 2018-02-19 MED ORDER — NITROGLYCERIN IN D5W 200-5 MCG/ML-% IV SOLN: 0.0000 ug/min | Freq: Once | INTRAVENOUS | Status: DC

## 2018-02-19 MED ORDER — AMLODIPINE-OLMESARTAN 5-40 MG PO TABS: 1.0000 | ORAL_TABLET | Freq: Every day | ORAL | Status: DC

## 2018-02-19 MED ORDER — ONDANSETRON HCL 4 MG PO TABS: 4.0000 mg | ORAL_TABLET | Freq: Four times a day (QID) | ORAL | Status: DC | PRN

## 2018-02-19 MED ORDER — NITROGLYCERIN IN D5W 200-5 MCG/ML-% IV SOLN
0.0000 ug/min | Freq: Once | INTRAVENOUS | Status: DC
  Filled 2018-02-19: qty 250

## 2018-02-19 MED ORDER — AMLODIPINE BESYLATE 5 MG PO TABS
5.0000 mg | ORAL_TABLET | Freq: Every day | ORAL | Status: DC
  Administered 2018-02-20: 5 mg via ORAL
  Filled 2018-02-19: qty 1

## 2018-02-19 MED ORDER — INSULIN DEGLUDEC-LIRAGLUTIDE 100-3.6 UNIT-MG/ML ~~LOC~~ SOPN
34.0000 [IU] | PEN_INJECTOR | Freq: Every morning | SUBCUTANEOUS | Status: DC
  Administered 2018-02-20 – 2018-02-22 (×3): 34 [IU] via SUBCUTANEOUS
  Filled 2018-02-19 (×2): qty 1

## 2018-02-19 MED ORDER — ASPIRIN-ACETAMINOPHEN-CAFFEINE 250-250-65 MG PO TABS: 1.0000 | ORAL_TABLET | Freq: Every day | ORAL | Status: DC | PRN

## 2018-02-19 MED ORDER — BISACODYL 10 MG RE SUPP: 10.0000 mg | Freq: Every day | RECTAL | Status: DC | PRN

## 2018-02-19 MED ORDER — FUROSEMIDE 10 MG/ML IJ SOLN
40.0000 mg | Freq: Once | INTRAMUSCULAR | Status: AC
  Administered 2018-02-19: 40 mg via INTRAVENOUS
  Filled 2018-02-19: qty 4

## 2018-02-19 MED ORDER — SENNOSIDES-DOCUSATE SODIUM 8.6-50 MG PO TABS: 1.0000 | ORAL_TABLET | Freq: Every evening | ORAL | Status: DC | PRN

## 2018-02-19 MED ORDER — FUROSEMIDE 10 MG/ML IJ SOLN
40.0000 mg | Freq: Two times a day (BID) | INTRAMUSCULAR | Status: DC
  Administered 2018-02-19 – 2018-02-22 (×6): 40 mg via INTRAVENOUS
  Filled 2018-02-19 (×6): qty 4

## 2018-02-19 MED ORDER — ENOXAPARIN SODIUM 40 MG/0.4ML ~~LOC~~ SOLN: 40.0000 mg | SUBCUTANEOUS | Status: DC

## 2018-02-19 MED ORDER — ALBUTEROL SULFATE (2.5 MG/3ML) 0.083% IN NEBU
2.5000 mg | INHALATION_SOLUTION | Freq: Four times a day (QID) | RESPIRATORY_TRACT | Status: DC | PRN
  Administered 2018-02-19 – 2018-02-21 (×4): 2.5 mg via RESPIRATORY_TRACT
  Filled 2018-02-19 (×4): qty 3

## 2018-02-19 MED ORDER — HYDROCODONE-ACETAMINOPHEN 5-325 MG PO TABS
1.0000 | ORAL_TABLET | ORAL | Status: DC | PRN
  Administered 2018-02-20: 2 via ORAL
  Filled 2018-02-19: qty 2

## 2018-02-19 MED ORDER — ACETAMINOPHEN 650 MG RE SUPP: 650.0000 mg | Freq: Four times a day (QID) | RECTAL | Status: DC | PRN

## 2018-02-19 MED ORDER — ALLOPURINOL 100 MG PO TABS
100.0000 mg | ORAL_TABLET | Freq: Every day | ORAL | Status: DC
  Administered 2018-02-20 – 2018-02-22 (×3): 100 mg via ORAL
  Filled 2018-02-19 (×3): qty 1

## 2018-02-19 MED ORDER — ACETAMINOPHEN 325 MG PO TABS
650.0000 mg | ORAL_TABLET | Freq: Four times a day (QID) | ORAL | Status: DC | PRN
  Administered 2018-02-21 – 2018-02-22 (×3): 650 mg via ORAL
  Filled 2018-02-19 (×3): qty 2

## 2018-02-19 MED ORDER — FUROSEMIDE 10 MG/ML IJ SOLN
49.0000 mg | Freq: Two times a day (BID) | INTRAMUSCULAR | Status: DC
Start: 2018-02-19 — End: 2018-02-19

## 2018-02-19 NOTE — Progress Notes (Signed)
Paged MD regarding EKG results per verbal order   Placed pt on continuous pulse oxygen

## 2018-02-19 NOTE — ED Notes (Signed)
RN put pt on O2 4L [place] and then went to do a procedure in another one of her assigned rooms. Pt's daughter then put her O2 on 3L and pt started to desat. Into the 70s. This tech went into pts exam room at put it back to 4L. Pt and pts family was asked not to change any of the monitors or O2 that the treatment team has set in place for the pt. Pt and pts family acknowledged understanding.

## 2018-02-19 NOTE — Progress Notes (Signed)
Pt educated about safety and importance of bed alarm during the night however pt refuses to be on bed alarm. Will continue to round on patient.   Aerilynn Goin, RN    

## 2018-02-19 NOTE — Progress Notes (Signed)
Paged MD regarding pt does not have a diet order and clarification on lasix  order  Awaiting call back

## 2018-02-19 NOTE — ED Notes (Signed)
IV team at bedside at this time. 

## 2018-02-19 NOTE — ED Notes (Signed)
ED Provider at bedside. 

## 2018-02-19 NOTE — ED Notes (Signed)
Pt O2 sats 75% on RA; placed on 5 L [place]. O2 sats now 93%. EDP made aware.

## 2018-02-19 NOTE — Progress Notes (Signed)
Paged MD regarding critical troponin of 0.23 Awaiting call back

## 2018-02-19 NOTE — ED Provider Notes (Signed)
[facility] EMERGENCY DEPARTMENT Provider Note   CSN: [id number] Arrival date & time: 02/19/18  1109     History   Chief Complaint Chief Complaint  Patient presents with  . Chest Pain    HPI Brandi Howell is a 52 y.o. female.  HPI Pt has been having trouble with shortness for the past week.  She does have a history of goiter and had a biopsy of her thyroid.  She is still waiting to hear what is going to be done with that.   Pt feels very short of breath at night.  She feels like her airway is getting obstructed.  She gets pain in her chest when she walks. She gets more short of breath with walking.  Pt also has a history of sleep apnea.  She is currently not on any treatment for that.    Past Medical History:  Diagnosis Date  . Asthma   . Diabetes mellitus without complication (HCC)   . Goiter   . Gout   . Hypercholesteremia   . Hypertension   . Vitamin D deficiency     Patient Active Problem List   Diagnosis Date Noted  . Acute medial meniscus tear of left knee 10/22/2017    Past Surgical History:  Procedure Laterality Date  . CHOLECYSTECTOMY    . KNEE ARTHROSCOPY    . KNEE ARTHROSCOPY Left 10/25/2017   Procedure: LEFT KNEE ARTHROSCOPY WITH MEDIAL MENISECTOMY, CHONDROPLASTY;  Surgeon: Gean Birchwood, MD;  Location: [facility] OR;  Service: Orthopedics;  Laterality: Left;  . TUBAL LIGATION       OB History   None      Home Medications    Prior to Admission medications   Medication Sig Start Date End Date Taking? Authorizing Provider  allopurinol (ZYLOPRIM) 100 MG tablet TAKE 1 TABLET BY MOUTH DAILY 06/27/17   [provider]  amLODipine-olmesartan (AZOR) 5-40 MG tablet Take 1 tablet by mouth daily.    [provider]  Arginine 500 MG CAPS Take 1 capsule by mouth 2 (two) times a week.    [provider]  aspirin-acetaminophen-caffeine (EXCEDRIN MIGRAINE) [phone] MG tablet Take 1 tablet by mouth daily as needed for  headache.    [provider]  cyclobenzaprine (FLEXERIL) 5 MG tablet TAKE 1 TO 2 TABLETS BY MOUTH  AT BEDTIME. MAY TAKE 1 TABLET BY MOUTH EVERY 8 HOURS DURING WAKING TIME IF NEEDED FOR MUSCLE SPASMS 09/29/17   [provider]  Fexofenadine-Pseudoephedrine (ALLEGRA-D PO) Take 1 tablet by mouth daily as needed (allergies).     [provider]  furosemide (LASIX) 20 MG tablet TAKE 1 TABLET BY MOUTH DAILY 07/13/17   [provider]  HYDROcodone-acetaminophen (NORCO) 5-325 MG tablet Take 1 tablet by mouth every 6 (six) hours as needed. 10/25/17   Allena Katz, PA-C  montelukast (SINGULAIR) 10 MG tablet TAKE 1 TABLET BY MOUTH DAILY 07/03/17   [provider]  Multiple Vitamins-Iron (MULTI-DAY PLUS IRON PO) Take 1 tablet by mouth daily.     [provider]  PROAIR HFA 108 (90 Base) MCG/ACT inhaler INHALE 2 PUFFS EVERY 6 HOURS AS NEEDED FOR SHORTNESS FO BREATH 07/20/17   [provider]  Vitamin D, Ergocalciferol, (DRISDOL) 50000 units CAPS capsule TK 1 CAPSULE BY MOUTH  TWO TIMES EVERY WEEK ON TUESDAYS AND FRIDAYS 10/04/17   [provider]  XULTOPHY 100-3.6 UNIT-MG/ML SOPN INJECT 34 UNITS SUBCUTANEOUSLY EVERY MORNING 06/21/17   [provider]  Family History No family history on file.  Social History Social History   Tobacco Use  . Smoking status: Never Smoker  . Smokeless tobacco: Never Used  Substance Use Topics  . Alcohol use: Yes    Frequency: Never    Comment: occ  . Drug use: No     Allergies   Lisinopril   Review of Systems Review of Systems  Constitutional: Negative for fever.  Respiratory: Positive for shortness of breath.   Gastrointestinal: Negative for abdominal pain.  All other systems reviewed and are negative.    Physical Exam Updated Vital Signs BP (!) 187/78   Pulse 94   Temp 99.8 F (37.7 C) (Oral)   Resp 11   SpO2 95%   Physical Exam  Constitutional: No distress.    Morbidly obese   HENT:  Head: Normocephalic and atraumatic.  Right Ear: External ear normal.  Left Ear: External ear normal.  Eyes: Conjunctivae are normal. Right eye exhibits no discharge. Left eye exhibits no discharge. No scleral icterus.  Neck: Neck supple. No tracheal deviation present.  No neck mass  Cardiovascular: Regular rhythm and intact distal pulses. Tachycardia present.  Pulmonary/Chest: Effort normal and breath sounds normal. No stridor. No respiratory distress. She has no wheezes. She has no rales.  Abdominal: Soft. Bowel sounds are normal. She exhibits no distension. There is no tenderness. There is no rebound and no guarding.  Musculoskeletal: She exhibits no tenderness.       Right lower leg: She exhibits edema.       Left lower leg: She exhibits edema.  Neurological: She is alert. She has normal strength. No cranial nerve deficit (no facial droop, extraocular movements intact, no slurred speech) or sensory deficit. She exhibits normal muscle tone. She displays no seizure activity. Coordination normal.  Skin: Skin is warm and dry. No rash noted.  Psychiatric: She has a normal mood and affect.  Nursing note and vitals reviewed.    ED Treatments / Results  Labs (all labs ordered are listed, but only abnormal results are displayed) Labs Reviewed  BASIC METABOLIC PANEL - Abnormal; Notable for the following components:      Result Value   Glucose, Bld 252 (*)    BUN 21 (*)    Creatinine, Ser 1.08 (*)    Calcium 8.8 (*)    GFR calc non Af Amer 58 (*)    All other components within normal limits  CBC - Abnormal; Notable for the following components:   WBC 13.3 (*)    Hemoglobin 11.5 (*)    MCH 24.6 (*)    MCHC 29.1 (*)    RDW 18.4 (*)    All other components within normal limits  I-STAT TROPONIN, ED - Abnormal; Notable for the following components:   Troponin i, poc 0.10 (*)    All other components within normal limits  BRAIN NATRIURETIC PEPTIDE    EKG EKG  Interpretation  Date/Time:  Saturday Feb 19 2018 11:38:46 EDT Ventricular Rate:  109 PR Interval:  138 QRS Duration: 82 QT Interval:  346 QTC Calculation: 465 R Axis:   39 Text Interpretation:  Sinus tachycardia Nonspecific ST abnormality Abnormal ECG No significant change since last tracing Confirmed by Linwood Dibbles [phone]) on 02/19/2018 12:54:23 PM Also confirmed by Linwood Dibbles [phone]), editor Barbette Hair [phone])  on 02/19/2018 1:27:45 PM   Radiology Dg Chest 2 View  Result Date: 02/19/2018 CLINICAL DATA:  Chest pain EXAM: CHEST - 2 VIEW COMPARISON:  None. FINDINGS:  The heart is markedly enlarged. Vascular congestion is present. Central and basilar airspace disease. There is soft tissue prominence in the right hilar region no pneumothorax. No pleural effusion. IMPRESSION: CHF with central and basilar pulmonary edema. Soft tissue prominence in the right hilum. Hilar mass is not excluded. Attention on follow-up radiographs is recommended. Electronically Signed   By: Jolaine Click M.D.   On: 02/19/2018 12:33    Procedures .Critical Care Performed by: Linwood Dibbles, MD Authorized by: Linwood Dibbles, MD   Critical care provider statement:    Critical care time (minutes):  35   Critical care was time spent personally by me on the following activities:  Discussions with consultants, evaluation of patient's response to treatment, examination of patient, ordering and performing treatments and interventions, ordering and review of laboratory studies, ordering and review of radiographic studies, pulse oximetry, re-evaluation of patient's condition, obtaining history from patient or surrogate and review of old charts   (including critical care time)  Medications Ordered in ED Medications  nitroGLYCERIN 50 mg in dextrose 5 % 250 mL (0.2 mg/mL) infusion (has no administration in time range)  furosemide (LASIX) injection 40 mg (40 mg Intravenous Given 02/19/18 1326)     Initial Impression / Assessment and  Plan / ED Course  I have reviewed the triage vital signs and the nursing notes.  Pertinent labs & imaging results that were available during my care of the patient were reviewed by me and considered in my medical decision making (see chart for details).  Clinical Course as of Feb 20 1408  Sat Feb 19, 2018  1358 Chest x-ray consistent with congestive heart failure.   [JK]  1407 LAbs notable for increased glucose but no DKA.  Trop elevated likely related to CHF, doubt acute ischemia.     [JK]    Clinical Course User Index [JK] Linwood Dibbles, MD   Patient presented to the emergency room for evaluation of shortness of breath.  When patient first arrived her oxygen saturation was in the mid 60s.  She was placed on supplemental nasal cannula oxygen and oxygen improved into the mid 90s.  Patient's chest x-ray is consistent with congestive heart failure.  Her symptoms are consistent with CHF.  Patient does not have a history of this.  She does have a history however obstructive sleep apnea and is not receiving any treatment.  This may be a factor in her right-sided failure.  Patient was started on diuretics.  I have also ordered nitroglycerin.  Plan on admission to the hospital for further treatment and evaluation.  Final Clinical Impressions(s) / ED Diagnoses   Final diagnoses:  Acute congestive heart failure, unspecified heart failure type (HCC)  Hypertension, unspecified type      Linwood Dibbles, MD 02/19/18 1409

## 2018-02-19 NOTE — Progress Notes (Signed)
MD aware of EKG result, stated to inform night shift MD and continue to monitor

## 2018-02-19 NOTE — ED Notes (Addendum)
Patient transported to X-ray 

## 2018-02-19 NOTE — Progress Notes (Signed)
Paged NP on call X.Blount about EKG and previous troponin since MD wanted night MD to see an EKG as well. No new orders placed at this time. Patients VS signs stable, patient denies chest pain 0/10. Troponin decreased from 0.23 to 0.20.  Will continue to monitor.  Neera Teng, RN

## 2018-02-19 NOTE — ED Notes (Signed)
Attempted PIV x 2, second attempt at PIV worked to administer lasix but not nitro. IV team consult placed. Nitro to be administered once new PIV established.

## 2018-02-19 NOTE — H&P (Signed)
History and Physical    Brandi Howell:[id number] DOB: Nov 09, 1965 DOA: 02/19/2018   PCP: Dorothyann Peng, MD   Patient coming from:  Home    Chief Complaint: Shortness of breath  HPI: Brandi Howell is a 52 y.o. female with medical history significant for asthma, diabetes, OSA, hypertension, hyperlipidemia, goiter, gout, presenting with 1 week history of progressive shortness of breath, and dry cough.  She felt like her airway was getting "clogged", worse when lying down, with ambulation.  She denies rhinorrhea, or hemoptysis.  She denies any fever, chills, night sweats or mucositis.  She denies any chest pain, chest wall pain or palpitations.  She denies any sick contacts or recent long distance travel.  No history of PE or DVT.  She denies any abdominal pain nausea or vomiting.  The patient has decreased appetite due to the current symptoms.  She denies any dizziness, vertigo, syncope or presyncope.  She denies any worsening lower extremity swelling or calf pain.  No confusion is reported.  She denies any vision changes, double vision or headaches.  Of note, the patient is compliant with her medications.  Changes in them.  She denies any food indiscretion, but has gained about 8 pounds over the last week.  She denies any tobacco, alcohol or recreational drug use.  No recent long distance trips.    ED Course:  BP (!) 187/78   Pulse 94   Temp 99.8 F (37.7 C) (Oral)   Resp 11   SpO2 95%   On presentation, her O2 sats were down in the 60s, and 70s, and placed on 5 L of nasal cannula, with improvement of her symptoms.  Currently, and in the mid 90s on 3 L..  She did not necessitate BiPAP use. Glucose 252, creatinine 1.08, white count 13.3, hemoglobin 11.5,   troponin 0.1.   BNP 123.6. Chest x-ray shows vascular congestion, heart is markedly enlarged consistent with CHF, with central and basilar pulmonary edema.  There is a soft tissue prominence in the right hilar region, with no  pneumothorax.  There is no pleural effusion. EKG shows sinus tachycardia, nonspecific ST abnormality, without significant change since last tracing. Did not receive paste, and Lasix injection 40 mg x 1, with significant diuresis, and in improvement of her symptoms. Note, on 01/20/2018, the patient had a needle aspiration for a thyroid follicular nodule, which was benign    Review of Systems:  As per HPI otherwise all other systems reviewed and are negative  Past Medical History:  Diagnosis Date  . Asthma   . Diabetes mellitus without complication (HCC)   . Goiter   . Gout   . Hypercholesteremia   . Hypertension   . Vitamin D deficiency     Past Surgical History:  Procedure Laterality Date  . CHOLECYSTECTOMY    . KNEE ARTHROSCOPY    . KNEE ARTHROSCOPY Left 10/25/2017   Procedure: LEFT KNEE ARTHROSCOPY WITH MEDIAL MENISECTOMY, CHONDROPLASTY;  Surgeon: Gean Birchwood, MD;  Location: [facility] OR;  Service: Orthopedics;  Laterality: Left;  . TUBAL LIGATION      Social History Social History   Socioeconomic History  . Marital status: Married    Spouse name: Not on file  . Number of children: Not on file  . Years of education: Not on file  . Highest education level: Not on file  Occupational History  . Not on file  Social Needs  . Financial resource strain: Not on file  . Food insecurity:  Worry: Not on file    Inability: Not on file  . Transportation needs:    Medical: Not on file    Non-medical: Not on file  Tobacco Use  . Smoking status: Never Smoker  . Smokeless tobacco: Never Used  Substance and Sexual Activity  . Alcohol use: Yes    Frequency: Never    Comment: occ  . Drug use: No  . Sexual activity: Not on file  Lifestyle  . Physical activity:    Days per week: Not on file    Minutes per session: Not on file  . Stress: Not on file  Relationships  . Social connections:    Talks on phone: Not on file    Gets together: Not on file    Attends religious service:  Not on file    Active member of club or organization: Not on file    Attends meetings of clubs or organizations: Not on file    Relationship status: Not on file  . Intimate partner violence:    Fear of current or ex partner: Not on file    Emotionally abused: Not on file    Physically abused: Not on file    Forced sexual activity: Not on file  Other Topics Concern  . Not on file  Social History Narrative  . Not on file     Allergies  Allergen Reactions  . Lisinopril Cough    History reviewed. No pertinent family history.    Prior to Admission medications   Medication Sig Start Date End Date Taking? Authorizing Provider  allopurinol (ZYLOPRIM) 100 MG tablet TAKE 1 TABLET BY MOUTH DAILY 06/27/17   [provider]  amLODipine-olmesartan (AZOR) 5-40 MG tablet Take 1 tablet by mouth daily.    [provider]  Arginine 500 MG CAPS Take 1 capsule by mouth 2 (two) times a week.    [provider]  aspirin-acetaminophen-caffeine (EXCEDRIN MIGRAINE) [phone] MG tablet Take 1 tablet by mouth daily as needed for headache.    [provider]  cyclobenzaprine (FLEXERIL) 5 MG tablet TAKE 1 TO 2 TABLETS BY MOUTH  AT BEDTIME. MAY TAKE 1 TABLET BY MOUTH EVERY 8 HOURS DURING WAKING TIME IF NEEDED FOR MUSCLE SPASMS 09/29/17   [provider]  Fexofenadine-Pseudoephedrine (ALLEGRA-D PO) Take 1 tablet by mouth daily as needed (allergies).     [provider]  furosemide (LASIX) 20 MG tablet TAKE 1 TABLET BY MOUTH DAILY 07/13/17   [provider]  HYDROcodone-acetaminophen (NORCO) 5-325 MG tablet Take 1 tablet by mouth every 6 (six) hours as needed. 10/25/17   Allena Katz, PA-C  montelukast (SINGULAIR) 10 MG tablet TAKE 1 TABLET BY MOUTH DAILY 07/03/17   [provider]  Multiple Vitamins-Iron (MULTI-DAY PLUS IRON PO) Take 1 tablet by mouth daily.     [provider]  PROAIR HFA 108 (90 Base) MCG/ACT inhaler INHALE 2  PUFFS EVERY 6 HOURS AS NEEDED FOR SHORTNESS FO BREATH 07/20/17   [provider]  Vitamin D, Ergocalciferol, (DRISDOL) 50000 units CAPS capsule TK 1 CAPSULE BY MOUTH  TWO TIMES EVERY WEEK ON TUESDAYS AND FRIDAYS 10/04/17   [provider]  XULTOPHY 100-3.6 UNIT-MG/ML SOPN INJECT 34 UNITS SUBCUTANEOUSLY EVERY MORNING 06/21/17   [provider]    Physical Exam:  Vitals:   02/19/18 1141 02/19/18 1245 02/19/18 1315 02/19/18 1330  BP: (!) 152/67  (!) 185/99 (!) 187/78  Pulse: (!) 112  96 94  Resp: 18  (!)  22 11  Temp: 99.8 F (37.7 C)     TempSrc: Oral     SpO2: (!) 65% (!) 75% 94% 95%   Constitutional: NAD, mildly anxious, wearing oxygen nasal cannula, appears comfortable le.  Eyes: PERRL, lids and conjunctivae normal ENMT: Mucous membranes are moist, without exudate or lesions  Neck: normal, supple, no masses, known goiter the patient has a large and short neck.  No bruits noted Respiratory: Very mild wheezing, no crackles.  Decreased breath sounds in the bases,Normal respiratory effort this time Cardiovascular: Regular rate and rhythm, 1 out of 6 murmur, rubs or gallops.  Trace of bilateral lower extremity edema. 2+ pedal pulses. No carotid bruits.  Abdomen: Morbidly obese soft, non tender, No hepatosplenomegaly. Bowel sounds positive.  Musculoskeletal: no clubbing / cyanosis. Moves all extremities Skin: no jaundice, No lesions.  Neurologic: Sensation intact  Strength equal in all extremities Psychiatric:   Alert and oriented x 3.  Anxious mood.     Labs on Admission: I have personally reviewed following labs and imaging studies  CBC: Recent Labs  Lab 02/19/18 1155  WBC 13.3*  HGB 11.5*  HCT 39.5  MCV 84.6  PLT 241    Basic Metabolic Panel: Recent Labs  Lab 02/19/18 1155  NA 142  K 3.8  CL 102  CO2 29  GLUCOSE 252*  BUN 21*  CREATININE 1.08*  CALCIUM 8.8*    GFR: CrCl cannot be calculated (Unknown ideal weight.).  Liver Function  Tests: No results for input(s): AST, ALT, ALKPHOS, BILITOT, PROT, ALBUMIN in the last 168 hours. No results for input(s): LIPASE, AMYLASE in the last 168 hours. No results for input(s): AMMONIA in the last 168 hours.  Coagulation Profile: No results for input(s): INR, PROTIME in the last 168 hours.  Cardiac Enzymes: No results for input(s): CKTOTAL, CKMB, CKMBINDEX, TROPONINI in the last 168 hours.  BNP (last 3 results) No results for input(s): PROBNP in the last 8760 hours.  HbA1C: No results for input(s): HGBA1C in the last 72 hours.  CBG: No results for input(s): GLUCAP in the last 168 hours.  Lipid Profile: No results for input(s): CHOL, HDL, LDLCALC, TRIG, CHOLHDL, LDLDIRECT in the last 72 hours.  Thyroid Function Tests: No results for input(s): TSH, T4TOTAL, FREET4, T3FREE, THYROIDAB in the last 72 hours.  Anemia Panel: No results for input(s): VITAMINB12, FOLATE, FERRITIN, TIBC, IRON, RETICCTPCT in the last 72 hours.  Urine analysis: No results found for: COLORURINE, APPEARANCEUR, LABSPEC, PHURINE, GLUCOSEU, HGBUR, BILIRUBINUR, KETONESUR, PROTEINUR, UROBILINOGEN, NITRITE, LEUKOCYTESUR  Sepsis Labs: (procalcitonin:4,lacticidven:4) )No results found for this or any previous visit (from the past 240 hour(s)).   Radiological Exams on Admission: Dg Chest 2 View  Result Date: 02/19/2018 CLINICAL DATA:  Chest pain EXAM: CHEST - 2 VIEW COMPARISON:  None. FINDINGS: The heart is markedly enlarged. Vascular congestion is present. Central and basilar airspace disease. There is soft tissue prominence in the right hilar region no pneumothorax. No pleural effusion. IMPRESSION: CHF with central and basilar pulmonary edema. Soft tissue prominence in the right hilum. Hilar mass is not excluded. Attention on follow-up radiographs is recommended. Electronically Signed   By: Jolaine Click M.D.   On: 02/19/2018 12:33    EKG: Independently reviewed.  Assessment/Plan Principal  Problem:   Acute respiratory failure with hypoxia (HCC) Active Problems:   Asthma   Morbid obesity (HCC)   Diabetes (HCC)   Hyperlipemia   Goiter   Gout   Vitamin D deficiency   Hypertension  Acute hypoxic respiratory failure likely secondary to Acute  CHF, with no prior diagnosis of same. BNP 123.6. Chest x-ray shows vascular congestion, heart is markedly enlarged consistent with CHF, with central and basilar pulmonary edema.  There is a soft tissue prominence in the right hilar region, with no pneumothorax.  There is no pleural effusion. EKG shows sinus tachycardia, nonspecific ST abnormality, without significant change since last tracing.  Troponin 0.1, likely due to oxygen demand.  Patient's weight is 310 pounds, the patient has had an 8 pound increase over the last week. On presentation, her O2 sats were down in the 60s, and 70s, and placed on 5 L of nasal cannula, with improvement of her symptoms.  Currently, and in the mid 90s on 3 L..  She did not necessitate BiPAP use.Received Nitropaste  and Lasix injection 40 mg x 1, with significant diuresis, and in improvement of her symptoms.  Place  telemetry obs   CHF order set  2 D echo  IV Lasix 40 mg bid  Strict I/Os Oxigen Repeat chest x-ray tomorrow, after diuresis, to further evaluate the right hilar opacity seen on the today's x-ray   OSA, patient not on BiPAP or CPAP.  Likely there is a chance that the patient has obesity hypoventilation syndrome, which can lead to other medical complications, including cardiac disease and stroke.  She already has significant cardiac risk factors for disease.  Counseling was provided, diet being lifestyle modification, diet, and follow-up with pulmonary for formal study. Will start CPAP here at the hospital.  We will continue to monitor.    Type II Diabetes Current blood sugar level is 252 Lab Results  Component Value Date   HGBA1C 8.5 (H) 10/25/2017  Hgb A1C Continue  Xultophy SSI   Hypertension BP 116/87  Pulse 89   Controlled Continue home anti-hypertensive medications     History of allergies, with recent flare.  Unclear if the patient has a history of COPD, there is no formal diagnosis of this.   The patient has been recently treated, given Symbicort, as well as Zyrtec, Singulair.  Vision, the patient was given prednisone taper, which she is to continue to finish her doses. She does have a history of OSA, but she has not been followed as an outpatient, she is not on CPAP. CPAP here In new meds as above Low-dose of nebulizer as needed with albuterol per respiratory status  Known goiter, with recent thyroid nodule biopsy, which was benign (01/20/2018) CHeck TSH   Leukocytosis, likely reactive, and recent prednisone use.  Current count is 13.  Patient is afebrile. We will hold antibiotic use for now  repeat CBC in AM  Gout, no acute flare Continue Allopurinol   DVT prophylaxis: Lovenox Code Status:    Full Family Communication:  Discussed with patient family Disposition Plan: Expect patient to be discharged to home after condition improves Consults called:    None Admission status: tele observation   Marlowe Kays, PA-C [facility]   Amion text  [phone]   02/19/2018, 2:39 PM

## 2018-02-19 NOTE — ED Triage Notes (Signed)
Patient complains of chest pain with only with exertion over the last week. Also reports bilateral weakness in lower extremities. Denies pain and shortness of breath while at rest.

## 2018-02-20 ENCOUNTER — Observation Stay (HOSPITAL_BASED_OUTPATIENT_CLINIC_OR_DEPARTMENT_OTHER): Payer: BLUE CROSS/BLUE SHIELD

## 2018-02-20 ENCOUNTER — Observation Stay (HOSPITAL_COMMUNITY): Payer: BLUE CROSS/BLUE SHIELD

## 2018-02-20 DIAGNOSIS — I5031 Acute diastolic (congestive) heart failure: Secondary | ICD-10-CM | POA: Diagnosis present

## 2018-02-20 DIAGNOSIS — I5030 Unspecified diastolic (congestive) heart failure: Secondary | ICD-10-CM | POA: Diagnosis present

## 2018-02-20 DIAGNOSIS — J9601 Acute respiratory failure with hypoxia: Secondary | ICD-10-CM | POA: Diagnosis present

## 2018-02-20 DIAGNOSIS — J4540 Moderate persistent asthma, uncomplicated: Secondary | ICD-10-CM | POA: Diagnosis present

## 2018-02-20 DIAGNOSIS — M109 Gout, unspecified: Secondary | ICD-10-CM | POA: Diagnosis present

## 2018-02-20 DIAGNOSIS — Z888 Allergy status to other drugs, medicaments and biological substances status: Secondary | ICD-10-CM | POA: Diagnosis not present

## 2018-02-20 DIAGNOSIS — J9602 Acute respiratory failure with hypercapnia: Secondary | ICD-10-CM | POA: Diagnosis not present

## 2018-02-20 DIAGNOSIS — E1165 Type 2 diabetes mellitus with hyperglycemia: Secondary | ICD-10-CM | POA: Diagnosis present

## 2018-02-20 DIAGNOSIS — E662 Morbid (severe) obesity with alveolar hypoventilation: Secondary | ICD-10-CM | POA: Diagnosis present

## 2018-02-20 DIAGNOSIS — M7989 Other specified soft tissue disorders: Secondary | ICD-10-CM

## 2018-02-20 DIAGNOSIS — E049 Nontoxic goiter, unspecified: Secondary | ICD-10-CM | POA: Diagnosis present

## 2018-02-20 DIAGNOSIS — E559 Vitamin D deficiency, unspecified: Secondary | ICD-10-CM | POA: Diagnosis present

## 2018-02-20 DIAGNOSIS — I248 Other forms of acute ischemic heart disease: Secondary | ICD-10-CM | POA: Diagnosis present

## 2018-02-20 DIAGNOSIS — J189 Pneumonia, unspecified organism: Secondary | ICD-10-CM | POA: Diagnosis present

## 2018-02-20 DIAGNOSIS — J811 Chronic pulmonary edema: Secondary | ICD-10-CM | POA: Diagnosis not present

## 2018-02-20 DIAGNOSIS — I2699 Other pulmonary embolism without acute cor pulmonale: Secondary | ICD-10-CM | POA: Diagnosis not present

## 2018-02-20 DIAGNOSIS — I509 Heart failure, unspecified: Secondary | ICD-10-CM | POA: Diagnosis not present

## 2018-02-20 DIAGNOSIS — E785 Hyperlipidemia, unspecified: Secondary | ICD-10-CM | POA: Diagnosis present

## 2018-02-20 DIAGNOSIS — E7800 Pure hypercholesterolemia, unspecified: Secondary | ICD-10-CM | POA: Diagnosis present

## 2018-02-20 DIAGNOSIS — E872 Acidosis: Secondary | ICD-10-CM | POA: Diagnosis present

## 2018-02-20 DIAGNOSIS — I110 Hypertensive heart disease with heart failure: Secondary | ICD-10-CM | POA: Diagnosis present

## 2018-02-20 DIAGNOSIS — Z6841 Body Mass Index (BMI) 40.0 and over, adult: Secondary | ICD-10-CM | POA: Diagnosis not present

## 2018-02-20 DIAGNOSIS — Z79899 Other long term (current) drug therapy: Secondary | ICD-10-CM | POA: Diagnosis not present

## 2018-02-20 LAB — BASIC METABOLIC PANEL
Anion gap: 13 (ref 5–15)
BUN: 17 mg/dL (ref 6–20)
CALCIUM: 8.2 mg/dL — AB (ref 8.9–10.3)
CO2: 28 mmol/L (ref 22–32)
CREATININE: 0.84 mg/dL (ref 0.44–1.00)
Chloride: 100 mmol/L — ABNORMAL LOW (ref 101–111)
GFR calc Af Amer: >60 mL/min (ref >60)
GFR calc non Af Amer: >60 mL/min (ref >60)
GLUCOSE: 189 mg/dL — AB (ref 65–99)
Potassium: 3.5 mmol/L (ref 3.5–5.1)
Sodium: 141 mmol/L (ref 135–145)

## 2018-02-20 LAB — GLUCOSE, CAPILLARY
GLUCOSE-CAPILLARY: 153 mg/dL — AB (ref 65–99)
GLUCOSE-CAPILLARY: 147 mg/dL — AB (ref 65–99)
Glucose-Capillary: 122 mg/dL — ABNORMAL HIGH (ref 65–99)
Glucose-Capillary: 126 mg/dL — ABNORMAL HIGH (ref 65–99)

## 2018-02-20 LAB — CBC
HCT: 38.1 % (ref 36.0–46.0)
Hemoglobin: 11.1 g/dL — ABNORMAL LOW (ref 12.0–15.0)
MCH: 24.3 pg — AB (ref 26.0–34.0)
MCHC: 29.1 g/dL — AB (ref 30.0–36.0)
MCV: 83.6 fL (ref 78.0–100.0)
PLATELETS: 206 10*3/uL (ref 150–400)
RBC: 4.56 MIL/uL (ref 3.87–5.11)
RDW: 17.6 % — ABNORMAL HIGH (ref 11.5–15.5)
WBC: 11 10*3/uL — ABNORMAL HIGH (ref 4.0–10.5)

## 2018-02-20 LAB — BLOOD GAS, ARTERIAL
Acid-Base Excess: 11.3 mmol/L — ABNORMAL HIGH (ref 0.0–2.0)
Bicarbonate: 36.2 mmol/L — ABNORMAL HIGH (ref 20.0–28.0)
DRAWN BY: 511851
FIO2: 21
O2 SAT: 66.5 %
PATIENT TEMPERATURE: 98.6
pCO2 arterial: 56.5 mmHg — ABNORMAL HIGH (ref 32.0–48.0)
pH, Arterial: 7.423 (ref 7.350–7.450)
pO2, Arterial: 36.5 mmHg — CL (ref 83.0–108.0)

## 2018-02-20 LAB — ECHOCARDIOGRAM COMPLETE
Height: 64 in
Weight: 5001.6 oz

## 2018-02-20 LAB — D-DIMER, QUANTITATIVE (NOT AT ARMC): D DIMER QUANT: 1.01 ug{FEU}/mL — AB (ref 0.00–0.50)

## 2018-02-20 LAB — TROPONIN I: Troponin I: 0.14 ng/mL (ref <0.03)

## 2018-02-20 LAB — HIV ANTIBODY (ROUTINE TESTING W REFLEX): HIV Screen 4th Generation wRfx: NONREACTIVE

## 2018-02-20 MED ORDER — IOPAMIDOL (ISOVUE-370) INJECTION 76%
INTRAVENOUS | Status: AC
  Filled 2018-02-20: qty 100

## 2018-02-20 MED ORDER — MONTELUKAST SODIUM 10 MG PO TABS
10.0000 mg | ORAL_TABLET | Freq: Every day | ORAL | Status: DC
  Administered 2018-02-20 – 2018-02-22 (×2): 10 mg via ORAL
  Filled 2018-02-20 (×3): qty 1

## 2018-02-20 MED ORDER — ALBUTEROL SULFATE (2.5 MG/3ML) 0.083% IN NEBU
2.5000 mg | INHALATION_SOLUTION | Freq: Four times a day (QID) | RESPIRATORY_TRACT | Status: DC
  Filled 2018-02-20: qty 3

## 2018-02-20 MED ORDER — ALBUTEROL SULFATE (2.5 MG/3ML) 0.083% IN NEBU
2.5000 mg | INHALATION_SOLUTION | Freq: Three times a day (TID) | RESPIRATORY_TRACT | Status: DC
  Administered 2018-02-20 – 2018-02-22 (×5): 2.5 mg via RESPIRATORY_TRACT
  Filled 2018-02-20 (×5): qty 3

## 2018-02-20 MED ORDER — METOPROLOL TARTRATE 5 MG/5ML IV SOLN
5.0000 mg | INTRAVENOUS | Status: DC | PRN
  Administered 2018-02-20: 5 mg via INTRAVENOUS
  Filled 2018-02-20: qty 5

## 2018-02-20 MED ORDER — ORAL CARE MOUTH RINSE
15.0000 mL | Freq: Two times a day (BID) | OROMUCOSAL | Status: DC
  Administered 2018-02-20 – 2018-02-22 (×3): 15 mL via OROMUCOSAL

## 2018-02-20 MED ORDER — IOPAMIDOL (ISOVUE-370) INJECTION 76%
100.0000 mL | Freq: Once | INTRAVENOUS | Status: AC
  Administered 2018-02-20: 100 mL via INTRAVENOUS

## 2018-02-20 MED ORDER — ENOXAPARIN SODIUM 150 MG/ML ~~LOC~~ SOLN
1.0000 mg/kg | Freq: Two times a day (BID) | SUBCUTANEOUS | Status: DC
  Administered 2018-02-21: 145 mg via SUBCUTANEOUS
  Filled 2018-02-20 (×2): qty 0.95

## 2018-02-20 MED ORDER — GUAIFENESIN ER 600 MG PO TB12
600.0000 mg | ORAL_TABLET | Freq: Two times a day (BID) | ORAL | Status: DC
Start: 2018-02-20 — End: 2018-02-22
  Administered 2018-02-20 – 2018-02-22 (×5): 600 mg via ORAL
  Filled 2018-02-20 (×5): qty 1

## 2018-02-20 MED ORDER — ENOXAPARIN SODIUM 150 MG/ML ~~LOC~~ SOLN
1.0000 mg/kg | Freq: Once | SUBCUTANEOUS | Status: AC
Start: 2018-02-20 — End: 2018-02-20
  Administered 2018-02-20: 145 mg via SUBCUTANEOUS
  Filled 2018-02-20: qty 0.95

## 2018-02-20 MED ORDER — IRBESARTAN 300 MG PO TABS
300.0000 mg | ORAL_TABLET | Freq: Every day | ORAL | Status: DC
  Administered 2018-02-21 – 2018-02-22 (×2): 300 mg via ORAL
  Filled 2018-02-20 (×2): qty 1

## 2018-02-20 MED ORDER — SODIUM CHLORIDE 0.9 % IV SOLN
2.0000 g | INTRAVENOUS | Status: DC
  Administered 2018-02-20 – 2018-02-21 (×2): 2 g via INTRAVENOUS
  Filled 2018-02-20 (×3): qty 20

## 2018-02-20 MED ORDER — AMLODIPINE BESYLATE 10 MG PO TABS
10.0000 mg | ORAL_TABLET | Freq: Every day | ORAL | Status: DC
  Administered 2018-02-21 – 2018-02-22 (×2): 10 mg via ORAL
  Filled 2018-02-20 (×2): qty 1

## 2018-02-20 MED ORDER — ALBUTEROL SULFATE (2.5 MG/3ML) 0.083% IN NEBU: 2.5000 mg | INHALATION_SOLUTION | Freq: Four times a day (QID) | RESPIRATORY_TRACT | Status: DC

## 2018-02-20 MED ORDER — SODIUM CHLORIDE 0.9 % IV SOLN
500.0000 mg | INTRAVENOUS | Status: DC
  Administered 2018-02-20 – 2018-02-21 (×2): 500 mg via INTRAVENOUS
  Filled 2018-02-20 (×3): qty 500

## 2018-02-20 NOTE — Progress Notes (Signed)
Per Dr Mariea Clonts, pt to be on bipap at hs only and order update, he called and spoke to pt and she verbalized understanding

## 2018-02-20 NOTE — Progress Notes (Signed)
BP elevated, page NP for PRN medication.  Mikisha Roseland, RN

## 2018-02-20 NOTE — Progress Notes (Addendum)
Upon entering room, patient currently  on CPAP, previous settings from previous shift (auto titrate max 20.0, min 5.0) with 2 lpm O2.,bleed in. Per visitor, she went on approximately 23:15.  Per my conversation with patient earlier in the shift, there was some miscommunication about CPAP vs BIPAP.  She states she is suppose to be on CPAP. I explained the order was changed to BIPAP but she strongly suggests she is to be on CPAP and does not wish to make any changes from the previous night settings.  Encouraged to call for Respiratory if assistance needed.RN aware.

## 2018-02-20 NOTE — Progress Notes (Signed)
Patient Demographics:    Brandi Howell, is a 52 y.o. female, DOB - 1966-06-05, XBJ:[id number]  Admit date - 02/19/2018   Admitting Physician Lahoma Crocker, MD  Outpatient Primary MD for the patient is Dorothyann Peng, MD  LOS - 0  Chief Complaint  Patient presents with  . Chest Pain        Subjective:    Brandi Howell today has no fevers, no emesis,  No chest pain, shortness of breath is better, dyspnea on exertion persist, hypoxia persist, orthopnea persist, husband and daughter at bedside, questions answered  Assessment  & Plan :    Principal Problem:   Acute respiratory failure with hypoxia (HCC) Active Problems:   Asthma   Morbid obesity (HCC)   Diabetes (HCC)   Hyperlipemia   Goiter   Gout   Vitamin D deficiency   Hypertension   Acute exacerbation of CHF (congestive heart failure) (HCC)  Brief summary 52 y.o. female with medical history significant for asthma, diabetes, OSA, hypertension, hyperlipidemia presenting with 1 week history of progressive shortness of breath, and dry cough admitted on 02/20/18 with presumed new onset diastolic dysfunction CHF   Plan:- 1) acute hypoxic and hypercapnic respiratory failure----ABG on room air done on 02/20/2018 after diuresis shows PCO2 of 36 and PCO2 of 57, with a pH of 7.42 and bicarb of 36.2 suggesting some chronicity of her respiratory acidosis----d-dimer is elevated, patient with lower extremity edema and dyspnea, CTA chest and lower extremity Dopplers pending to rule out DVT.  Be judicious with oxygen as patient is a CO2 retainer  2)HFpEF/elevated troponin -shortness of breath is better, dyspnea on exertion persist, hypoxia persist --- Echo with EF of 60 to 65%, suspect acute diastolic CHF--- continue diuresis, need to control BP better, troponin is borderline elevated but otherwise flat, echo without regional wall motion abnormalities,  suspect troponin elevation secondary to demand ischemia in the setting of acute CHF rather than frank ACS.   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--- patient recently had a sleep study, BiPAP was recommended, use BiPAP/CPAP while patient is here  4) acute hypoxic respiratory failure with significant dyspnea--- d-dimer is positive, patient with lower extremity edema, CTA chest and venous Dopplers to rule out PE and DVT pending, patient has been reluctant to take prophylactic Lovenox despite persuasion  5)HTN-uncontrolled, contributing to #1 and #2 above, continue Avapro 300 mg daily, amlodipine should be increased to 10 mg daily,   may use IV Hydralazine 10 mg  Every 4 hours Prn for systolic blood pressure over 160 mmhg  6)DM-poor control, A1c just above 8, continue Insulin Degludec-Liraglutide (XULTOPHY) 34 units daily, Use Novolog/Humalog Sliding scale insulin with Accu-Cheks/Fingersticks as ordered   7) moderate persistent asthma-does not appear to be an asthma flareup at this time, hold off on steroids, continue bronchodilators and singular  Code Status : Full   Disposition Plan  : TBD  Consults  :  TBD   DVT Prophylaxis  :  Lovenox    Lab Results  Component Value Date   PLT 206 02/20/2018    Inpatient Medications  Scheduled Meds: . allopurinol  100 mg Oral Daily  . irbesartan  300 mg Oral Daily   And  . amLODipine  5 mg Oral  Daily  . enoxaparin (LOVENOX) injection  40 mg Subcutaneous Q24H  . furosemide  40 mg Intravenous BID  . insulin aspart  0-9 Units Subcutaneous TID WC  . Insulin Degludec-Liraglutide  34 Units Subcutaneous q morning - 10a  . mouth rinse  15 mL Mouth Rinse BID  . montelukast  10 mg Oral QHS  . nitroGLYCERIN  1 inch Topical Q6H   Continuous Infusions: PRN Meds:.acetaminophen **OR** acetaminophen, albuterol, aspirin-acetaminophen-caffeine, bisacodyl, HYDROcodone-acetaminophen, metoprolol tartrate, ondansetron **OR** ondansetron  (ZOFRAN) IV, senna-docusate    Anti-infectives (From admission, onward)   None        Objective:   Vitals:   02/20/18 0340 02/20/18 0947 02/20/18 1056 02/20/18 1335  BP: (!) 159/89 (!) 181/91  (!) 152/65  Pulse: 81 89 91 86  Resp: Temp: 98.8 F (37.1 C)   98.4 F (36.9 C)  TempSrc: Axillary   Oral  SpO2: 94% 94% 95% 93%  Weight: (!) 141.8 kg (312 lb 9.6 oz)     Height:        Wt Readings from Last 3 Encounters:  02/20/18 (!) 141.8 kg (312 lb 9.6 oz)  10/25/17 (!) 139.7 kg (308 lb)  07/21/17 (!) 139.7 kg (308 lb)     Intake/Output Summary (Last 24 hours) at 02/20/2018 1349 Last data filed at 02/20/2018 0900 Gross per 24 hour  Intake 600 ml  Output 2775 ml  Net -2175 ml     Physical Exam  Gen:- Awake Alert, bili obese, speaks in sentences HEENT:- [place].AT, No sclera icterus Nose-nasal cannula at 2 L/min Neck-Supple Neck,No JVD,.  Lungs-bibasilar rales, diminished breath sounds , no wheezing  CV- S1, S2 normal Abd-  +ve B.Sounds, Abd Soft, No tenderness, significantly increased truncal adiposity Extremity/Skin:-1-2+ pitting edema, good pulses Psych-affect is appropriate, oriented x3 Neuro-no new focal deficits, no tremors   Data Review:   Micro Results No results found for this or any previous visit (from the past 240 hour(s)).  Radiology Reports Dg Chest 2 View  Result Date: 02/20/2018 CLINICAL DATA:  Congestive heart failure EXAM: CHEST - 2 VIEW COMPARISON:  02/19/2018 FINDINGS: Moderate enlargement of the cardiopericardial silhouette noted with indistinctness of pulmonary vasculature, bilateral interstitial accentuation slightly worsened than the previous exam, and some faint right perihilar and likely left lower lobe airspace opacities. No definite blunting of the costophrenic angles. No appreciable bony abnormality. IMPRESSION: 1. Moderate enlargement of the cardiopericardial silhouette with interstitial pulmonary edema and early right perihilar  and left basilar airspace edema. Electronically Signed   By: Gaylyn Rong M.D.   On: 02/20/2018 09:06   Dg Chest 2 View  Result Date: 02/19/2018 CLINICAL DATA:  Chest pain EXAM: CHEST - 2 VIEW COMPARISON:  None. FINDINGS: The heart is markedly enlarged. Vascular congestion is present. Central and basilar airspace disease. There is soft tissue prominence in the right hilar region no pneumothorax. No pleural effusion. IMPRESSION: CHF with central and basilar pulmonary edema. Soft tissue prominence in the right hilum. Hilar mass is not excluded. Attention on follow-up radiographs is recommended. Electronically Signed   By: Jolaine Click M.D.   On: 02/19/2018 12:33     CBC Recent Labs  Lab 02/19/18 1155 02/20/18 0032  WBC 13.3* 11.0*  HGB 11.5* 11.1*  HCT 39.5 38.1  PLT 241 206  MCV 84.6 83.6  MCH 24.6* 24.3*  MCHC 29.1* 29.1*  RDW 18.4* 17.6*    Chemistries  Recent Labs  Lab 02/19/18 1155 02/20/18 0032  NA 142  141  K 3.8 3.5  CL 102 100*  CO2 29 28  GLUCOSE 252* 189*  BUN 21* 17  CREATININE 1.08* 0.84  CALCIUM 8.8* 8.2*   ------------------------------------------------------------------------------------------------------------------ No results for input(s): CHOL, HDL, LDLCALC, TRIG, CHOLHDL, LDLDIRECT in the last 72 hours.  Lab Results  Component Value Date   HGBA1C 8.0 (H) 02/19/2018   ------------------------------------------------------------------------------------------------------------------ Recent Labs    02/19/18 1633  TSH 0.498   ------------------------------------------------------------------------------------------------------------------ No results for input(s): VITAMINB12, FOLATE, FERRITIN, TIBC, IRON, RETICCTPCT in the last 72 hours.  Coagulation profile No results for input(s): INR, PROTIME in the last 168 hours.  Recent Labs    02/20/18 1144  DDIMER 1.01*    Cardiac Enzymes Recent Labs  Lab 02/19/18 1643 02/19/18 1947  02/20/18 0031  TROPONINI 0.23* 0.20* 0.14*   ------------------------------------------------------------------------------------------------------------------    Component Value Date/Time   BNP 123.6 (H) 02/19/2018 1155     Brandi Howell M.D on 02/20/2018 at 1:49 PM  Between 7am to 7pm - Pager - [phone]  After 7pm go to [email or web address] - password [email or web address]  [facility] -  Office  [phone]   Voice Recognition Reubin Milan dictation system was used to create this note, attempts have been made to correct errors. Please contact the author with questions and/or clarifications.

## 2018-02-20 NOTE — Progress Notes (Signed)
Dr Mariea Clonts called to advise pt had multiple small pe scattered and RUL pnemona per CT radiologist, pt is to take therapeutic lovenox and iv antibiotics, upon review of new orders, pt has continous bipap orders, per charge lauren if pt needs continous bipap then will need to transfer to stepdown, paged Dr Mariea Clonts

## 2018-02-20 NOTE — Progress Notes (Signed)
Metoprolol IV ordered and given.  Eytan Carrigan, RN

## 2018-02-20 NOTE — Progress Notes (Signed)
VASCULAR LAB PRELIMINARY  PRELIMINARY  PRELIMINARY  PRELIMINARY  Bilateral lower extremity venous duplex completed.    Preliminary report:  There is no obvious evidence of DVT or SVT noted in the bilateral lower extremities.   Darrian Goodwill, RVT 02/20/2018, 3:08 PM

## 2018-02-20 NOTE — Progress Notes (Signed)
Pt asking about home singulair, paged md

## 2018-02-20 NOTE — Progress Notes (Signed)
Pt is getting 2d echoat this time, pt also ask last troponin level and xra results. I advised the xray results are not in the computer yet and her last troponin at0030 was downto0.14, pt denies chest pain or pressure

## 2018-02-20 NOTE — Progress Notes (Signed)
  Echocardiogram 2D Echocardiogram has been performed.  Brandi Howell 02/20/2018, 9:16 AM

## 2018-02-21 DIAGNOSIS — J9602 Acute respiratory failure with hypercapnia: Secondary | ICD-10-CM

## 2018-02-21 LAB — GLUCOSE, CAPILLARY
GLUCOSE-CAPILLARY: 146 mg/dL — AB (ref 65–99)
GLUCOSE-CAPILLARY: 114 mg/dL — AB (ref 65–99)
Glucose-Capillary: 120 mg/dL — ABNORMAL HIGH (ref 65–99)
Glucose-Capillary: 135 mg/dL — ABNORMAL HIGH (ref 65–99)

## 2018-02-21 LAB — CBC
HEMATOCRIT: 39.6 % (ref 36.0–46.0)
HEMOGLOBIN: 11.6 g/dL — AB (ref 12.0–15.0)
MCH: 24.6 pg — AB (ref 26.0–34.0)
MCHC: 29.3 g/dL — AB (ref 30.0–36.0)
MCV: 84.1 fL (ref 78.0–100.0)
Platelets: 204 10*3/uL (ref 150–400)
RBC: 4.71 MIL/uL (ref 3.87–5.11)
RDW: 17.1 % — ABNORMAL HIGH (ref 11.5–15.5)
WBC: 8.8 10*3/uL (ref 4.0–10.5)

## 2018-02-21 LAB — BASIC METABOLIC PANEL
Anion gap: 10 (ref 5–15)
BUN: 11 mg/dL (ref 6–20)
CALCIUM: 8.1 mg/dL — AB (ref 8.9–10.3)
CHLORIDE: 97 mmol/L — AB (ref 101–111)
CO2: 34 mmol/L — AB (ref 22–32)
Creatinine, Ser: 0.72 mg/dL (ref 0.44–1.00)
GFR calc Af Amer: >60 mL/min (ref >60)
GFR calc non Af Amer: >60 mL/min (ref >60)
GLUCOSE: 153 mg/dL — AB (ref 65–99)
Potassium: 3 mmol/L — ABNORMAL LOW (ref 3.5–5.1)
Sodium: 141 mmol/L (ref 135–145)

## 2018-02-21 MED ORDER — ENOXAPARIN SODIUM 150 MG/ML ~~LOC~~ SOLN
140.0000 mg | Freq: Two times a day (BID) | SUBCUTANEOUS | Status: DC
  Administered 2018-02-21 – 2018-02-22 (×2): 140 mg via SUBCUTANEOUS
  Filled 2018-02-21 (×3): qty 0.93

## 2018-02-21 MED ORDER — POTASSIUM CHLORIDE CRYS ER 20 MEQ PO TBCR
40.0000 meq | EXTENDED_RELEASE_TABLET | Freq: Once | ORAL | Status: AC
  Administered 2018-02-21: 40 meq via ORAL
  Filled 2018-02-21: qty 2

## 2018-02-21 MED ORDER — GUAIFENESIN-DM 100-10 MG/5ML PO SYRP: 5.0000 mL | ORAL_SOLUTION | ORAL | Status: DC | PRN

## 2018-02-21 MED ORDER — POTASSIUM CHLORIDE CRYS ER 20 MEQ PO TBCR: 40.0000 meq | EXTENDED_RELEASE_TABLET | Freq: Once | ORAL | Status: DC

## 2018-02-21 NOTE — Progress Notes (Signed)
ANTICOAGULATION CONSULT NOTE - Initial Consult  Pharmacy Consult for Lovenox Indication: pulmonary embolus  Allergies  Allergen Reactions  . Lisinopril Cough  . Statins Other (See Comments)    Cause muscle weakness   Patient Measurements: Height:  (162.6 cm) Weight: (!) 304 lb 10.8 oz (138.2 kg) IBW/kg (Calculated) : 54.7  Vital Signs: Temp: 98.8 F (37.1 C) (05/27 0358) Temp Source: Oral (05/27 0358) BP: 134/66 (05/27 1241) Pulse Rate: 84 (05/27 1241)  Labs: Recent Labs    02/19/18 1155 02/19/18 1643 02/19/18 1947 02/20/18 0031 02/20/18 0032 02/21/18 0330  HGB 11.5*  --   --   --  11.1* 11.6*  HCT 39.5  --   --   --  38.1 39.6  PLT 241  --   --   --  206 204  CREATININE 1.08*  --   --   --  0.84 0.72  TROPONINI  --  0.23* 0.20* 0.14*  --   --    Estimated Creatinine Clearance: 115.7 mL/min (by C-G formula based on SCr of 0.72 mg/dL).  Assessment: 27 yoF presenting with progressive SOB and dry cough. CTA chest with scattered nonobstructive pulmonary emboli. Pharmacy consulted to dose Lovenox for PE treatment. Plan is to eventually discharge on Eliquis. Hgb 11.6 stable, pltc WNL. Scr 0.72, estimated CrCl > 100 mL/min.  Goal of Therapy:  Monitor platelets by anticoagulation protocol: Yes   Plan:  Lovenox  SQ q12h Monitor renal function and s/sx of bleeding F/u transition to [employer]. Zigmund Daniel, PharmD PGY1 Pharmacy Resident Pager: [phone] 02/21/2018,1:18 PM

## 2018-02-21 NOTE — Progress Notes (Signed)
Ambulated in the hallway on RA SPo2 started at 94% dropped to 70% placed on o2 during ambulation spo2 88% no c/o of distress.

## 2018-02-21 NOTE — Progress Notes (Signed)
Patient Demographics:    Brandi Howell, is a 52 y.o. female, DOB - [birth year], UJW:[id number]  Admit date - 02/19/2018   Admitting Physician Lahoma Crocker, MD  Outpatient Primary MD for the patient is Dorothyann Peng, MD  LOS - 1  Chief Complaint  Patient presents with  . Chest Pain        Subjective:    Brandi Howell today has no fevers, no emesis,  3 daughters at bedside, questions answered, patient remains noncompliant with oxygen and CPAP at times  Assessment  & Plan :    Principal Problem:   Acute respiratory failure with hypoxia and hypercapnia due to dCHF/PE/PNA Active Problems:   Asthma   Morbid obesity (HCC)   Diabetes (HCC)   Hyperlipemia   Goiter   Gout   Vitamin D deficiency   Hypertension   Acute exacerbation of CHF (congestive heart failure) (HCC)   (HFpEF) heart failure with preserved ejection fraction (HCC)   Pulmonary embolism (HCC)   Pneumonia  Brief summary 52 y.o. female with medical history significant for asthma, diabetes, OSA, hypertension, hyperlipidemia presenting with 1 week history of progressive shortness of breath, and dry cough admitted on 02/19/18 with presumed new onset diastolic dysfunction CHF, CTA chest from 02/20/2018 with bilateral PE and multifocal pneumonia.    Plan:- 1)Acute hypoxic and hypercapnic respiratory failure----ABG on room air done on 02/20/2018 after diuresis shows PCO2 of 36 and PCO2 of 57, with a pH of 7.42 and bicarb of 36.2 suggesting some chronicity of her respiratory acidosis----  Be judicious with oxygen as patient is a CO2 retainer, as of 02/21/2018 oxygen saturation 82% on room air at rest, Oxygen saturation on 3 L of oxygen via nasal cannula is up to 93%   2)HFpEF/elevated troponin -shortness of breath improving with diuresis, and supplemental oxygen, dyspnea on exertion persist, hypoxia persist --- Echo with EF of 60 to 65%,  suspect acute diastolic CHF---  need to control BP better, troponin is borderline elevated but otherwise flat, echo without regional wall motion abnormalities, suspect troponin elevation secondary to demand ischemia in the setting of acute CHF rather than frank ACS.  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--- patient recently had a sleep study, BiPAP was recommended, use BiPAP/CPAP while patient is here, case manager to help get CPAP for home use post discharge  4) acute bilateral PEs--- mostly on the left, therapeutic Lovenox as ordered, plan to discharge home on Eliquis acute hypoxic respiratory failure with significant dyspnea---  CTA chest confirmed pulmonary embolism, venous Dopplers without acute DVT,   5)HTN-BP control is improving, continue Avapro 300 mg daily, amlodipine should be increased to 10 mg daily,   may use IV Hydralazine 10 mg  Every 4 hours Prn for systolic blood pressure over 160 mmhg  6)DM-poor control, A1c just above 8, continue Insulin Degludec-Liraglutide (XULTOPHY) 34 units daily, Use Novolog/Humalog Sliding scale insulin with Accu-Cheks/Fingersticks as ordered   7) moderate persistent asthma-does not appear to be an asthma flareup at this time, hold off on steroids, continue bronchodilators and singular  8) multifocal pneumonia/CAP-hypoxia is most likely due to CHF and PE, pneumonia probably contributory, continue IV Rocephin/azithromycin and bronchodilators with mucolytics.  Patient is afebrile, leukocytosis has resolved  9)Dispo-possible  discharge home on 02/22/2018 with home O2, CPAP and Eliquis  Code Status : Full   Disposition Plan  : TBD  Consults  :  TBD   DVT Prophylaxis  :  Lovenox    Lab Results  Component Value Date   PLT 204 02/21/2018    Inpatient Medications  Scheduled Meds: . albuterol  2.5 mg Nebulization TID  . allopurinol  100 mg Oral Daily  . amLODipine  10 mg Oral Daily   And  . irbesartan  300  mg Oral Daily  . enoxaparin (LOVENOX) injection  140 mg Subcutaneous Q12H  . furosemide  40 mg Intravenous BID  . guaiFENesin  600 mg Oral BID  . insulin aspart  0-9 Units Subcutaneous TID WC  . Insulin Degludec-Liraglutide  34 Units Subcutaneous q morning - 10a  . mouth rinse  15 mL Mouth Rinse BID  . montelukast  10 mg Oral QHS  . nitroGLYCERIN  1 inch Topical Q6H  . potassium chloride  40 mEq Oral Once   Continuous Infusions: . azithromycin 250 mL/hr at 02/20/18 1900  . cefTRIAXone (ROCEPHIN)  IV Stopped (02/20/18 1630)   PRN Meds:.acetaminophen **OR** acetaminophen, albuterol, aspirin-acetaminophen-caffeine, bisacodyl, HYDROcodone-acetaminophen, metoprolol tartrate, ondansetron **OR** ondansetron (ZOFRAN) IV, senna-docusate    Anti-infectives (From admission, onward)   Start     Dose/Rate Route Frequency Ordered Stop   02/20/18 1600  cefTRIAXone (ROCEPHIN) 2 g in sodium chloride 0.9 % 100 mL IVPB     2 g 200 mL/hr over 30 Minutes Intravenous Every 24 hours 02/20/18 1508     02/20/18 1600  azithromycin (ZITHROMAX) 500 mg in sodium chloride 0.9 % 250 mL IVPB     500 mg 250 mL/hr over 60 Minutes Intravenous Every 24 hours 02/20/18 1508          Objective:   Vitals:   02/21/18 0358 02/21/18 0736 02/21/18 1001 02/21/18 1241  BP: (!) 138/53  (!) 143/74 134/66  Pulse: 85 82 83 84  Resp: Temp: 98.8 F (37.1 C)     TempSrc: Oral     SpO2: 95% 92%  97%  Weight:      Height:        Wt Readings from Last 3 Encounters:  02/21/18 (!) 138.2 kg (304 lb 10.8 oz)  10/25/17 (!) 139.7 kg (308 lb)  07/21/17 (!) 139.7 kg (308 lb)     Intake/Output Summary (Last 24 hours) at 02/21/2018 1426 Last data filed at 02/21/2018 0605 Gross per 24 hour  Intake 1320 ml  Output 750 ml  Net 570 ml     Physical Exam  Gen:- Awake Alert, morbidly obese, speaks in sentences HEENT:- [place].AT, No sclera icterus Nose-nasal cannula at 2 L/min Neck-Supple Neck,No JVD,.    Lungs-bibasilar rales, diminished breath sounds , no wheezing  CV- S1, S2 normal Abd-  +ve B.Sounds, Abd Soft, No tenderness, significantly increased truncal adiposity Extremity/Skin:-1-2+ pitting edema, good pulses Psych-affect is appropriate, oriented x3 Neuro-no new focal deficits, no tremors   Data Review:   Micro Results No results found for this or any previous visit (from the past 240 hour(s)).  Radiology Reports Dg Chest 2 View  Result Date: 02/20/2018 CLINICAL DATA:  Congestive heart failure EXAM: CHEST - 2 VIEW COMPARISON:  02/19/2018 FINDINGS: Moderate enlargement of the cardiopericardial silhouette noted with indistinctness of pulmonary vasculature, bilateral interstitial accentuation slightly worsened than the previous exam, and some faint right perihilar and likely left lower lobe airspace opacities. No definite  blunting of the costophrenic angles. No appreciable bony abnormality. IMPRESSION: 1. Moderate enlargement of the cardiopericardial silhouette with interstitial pulmonary edema and early right perihilar and left basilar airspace edema. Electronically Signed   By: Gaylyn Rong M.D.   On: 02/20/2018 09:06   Dg Chest 2 View  Result Date: 02/19/2018 CLINICAL DATA:  Chest pain EXAM: CHEST - 2 VIEW COMPARISON:  None. FINDINGS: The heart is markedly enlarged. Vascular congestion is present. Central and basilar airspace disease. There is soft tissue prominence in the right hilar region no pneumothorax. No pleural effusion. IMPRESSION: CHF with central and basilar pulmonary edema. Soft tissue prominence in the right hilum. Hilar mass is not excluded. Attention on follow-up radiographs is recommended. Electronically Signed   By: Jolaine Click M.D.   On: 02/19/2018 12:33   Ct Angio Chest Pe W Or Wo Contrast  Result Date: 02/20/2018 CLINICAL DATA:  Positive D-dimer. Clinical suspicion for pulmonary embolus. EXAM: CT ANGIOGRAPHY CHEST WITH CONTRAST TECHNIQUE: Multidetector CT  imaging of the chest was performed using the standard protocol during bolus administration of intravenous contrast. Multiplanar CT image reconstructions and MIPs were obtained to evaluate the vascular anatomy. CONTRAST:  ISOVUE-370 IOPAMIDOL (ISOVUE-370) INJECTION 76% COMPARISON:  Chest radiograph E 02/20/2018 FINDINGS: Cardiovascular: Satisfactory opacification of the pulmonary arteries to the segmental level. Small nonocclusive pulmonary emboli within segmental and subsegmental branches of the basilar segments of the left lower lobe. Suspected subsegmental nonocclusive pulmonary emboli in the right lower lobe. No evidence of right heart strain. No pericardial effusion. Grossly normal thoracic aorta. Mediastinum/Nodes: No enlarged mediastinal, hilar, or axillary lymph nodes. Thyroid gland, trachea, and esophagus demonstrate no significant findings. Lungs/Pleura: Bilateral patchy areas of airspace consolidation throughout both lungs with upper lobe predominance. Upper Abdomen: No acute abnormality. Musculoskeletal: No chest wall abnormality. No acute or significant osseous findings. Review of the MIP images confirms the above findings. IMPRESSION: Scattered nonobstructive pulmonary emboli within segmental and subsegmental branches of the basilar segments of the left lower lobe. Suspected subsegmental nonocclusive pulmonary emboli in the right lower lobe. No evidence of right heart strain. Multifocal bilateral airspace consolidation with upper lobe predominance. In the appropriate clinical setting, this likely represents multifocal pneumonia. Some of the peripheral areas of consolidation may potentially represent pulmonary infarctions, however the degree and distribution of airspace consolidation is out of proportion to the small peripheral pulmonary emboli seen, and therefore infectious etiology is favored. These results were called by telephone at the time of interpretation on 02/20/2018 at 3:01 pm to Dr.  Shon Hale , who verbally acknowledged these results. Electronically Signed   By: Ted Mcalpine M.D.   On: 02/20/2018 15:02     CBC Recent Labs  Lab 02/19/18 1155 02/20/18 0032 02/21/18 0330  WBC 13.3* 11.0* 8.8  HGB 11.5* 11.1* 11.6*  HCT 39.5 38.1 39.6  PLT 241 206 204  MCV 84.6 83.6 84.1  MCH 24.6* 24.3* 24.6*  MCHC 29.1* 29.1* 29.3*  RDW 18.4* 17.6* 17.1*    Chemistries  Recent Labs  Lab 02/19/18 1155 02/20/18 0032 02/21/18 0330  NA 142 141 141  K 3.8 3.5 3.0*  CL 102 100* 97*  CO2 29 28 34*  GLUCOSE 252* 189* 153*  BUN 21* 17 11  CREATININE 1.08* 0.84 0.72  CALCIUM 8.8* 8.2* 8.1*   ------------------------------------------------------------------------------------------------------------------ No results for input(s): CHOL, HDL, LDLCALC, TRIG, CHOLHDL, LDLDIRECT in the last 72 hours.  Lab Results  Component Value Date   HGBA1C 8.0 (H) 02/19/2018   ------------------------------------------------------------------------------------------------------------------ Recent  Labs    02/19/18 1633  TSH 0.498   ------------------------------------------------------------------------------------------------------------------ No results for input(s): VITAMINB12, FOLATE, FERRITIN, TIBC, IRON, RETICCTPCT in the last 72 hours.  Coagulation profile No results for input(s): INR, PROTIME in the last 168 hours.  Recent Labs    02/20/18 1144  DDIMER 1.01*    Cardiac Enzymes Recent Labs  Lab 02/19/18 1643 02/19/18 1947 02/20/18 0031  TROPONINI 0.23* 0.20* 0.14*   ------------------------------------------------------------------------------------------------------------------    Component Value Date/Time   BNP 123.6 (H) 02/19/2018 1155     Brandi Howell M.D on 02/21/2018 at 2:26 PM  Between 7am to 7pm - Pager - [phone]  After 7pm go to [email or web address] - password [email or web address]  [facility] -  Office  [phone]   Voice Recognition  Reubin Milan dictation system was used to create this note, attempts have been made to correct errors. Please contact the author with questions and/or clarifications.

## 2018-02-21 NOTE — Progress Notes (Signed)
Pt had discussed with the therapist from last night and stated that she does not wish to be changed to BIPAP as she wears CPAP at home. The patient states that she will place the CPAP on when she is ready for bed. Will continue to monitor.

## 2018-02-22 LAB — BASIC METABOLIC PANEL
ANION GAP: 9 (ref 5–15)
BUN: 10 mg/dL (ref 6–20)
CALCIUM: 8.7 mg/dL — AB (ref 8.9–10.3)
CHLORIDE: 96 mmol/L — AB (ref 101–111)
CO2: 36 mmol/L — ABNORMAL HIGH (ref 22–32)
CREATININE: 0.73 mg/dL (ref 0.44–1.00)
GFR calc Af Amer: >60 mL/min (ref >60)
GFR calc non Af Amer: >60 mL/min (ref >60)
GLUCOSE: 140 mg/dL — AB (ref 65–99)
POTASSIUM: 3 mmol/L — AB (ref 3.5–5.1)
Sodium: 141 mmol/L (ref 135–145)

## 2018-02-22 LAB — MAGNESIUM: Magnesium: 2.2 mg/dL (ref 1.7–2.4)

## 2018-02-22 LAB — GLUCOSE, CAPILLARY
GLUCOSE-CAPILLARY: 136 mg/dL — AB (ref 65–99)
GLUCOSE-CAPILLARY: 97 mg/dL (ref 65–99)

## 2018-02-22 MED ORDER — ELIQUIS 5 MG VTE STARTER PACK: ORAL_TABLET | ORAL | 0 refills | Status: DC

## 2018-02-22 MED ORDER — LACTINEX PO CHEW: 1.0000 | CHEWABLE_TABLET | Freq: Three times a day (TID) | ORAL | 0 refills | Status: DC

## 2018-02-22 MED ORDER — GUAIFENESIN ER 600 MG PO TB12: 600.0000 mg | ORAL_TABLET | Freq: Two times a day (BID) | ORAL | 1 refills | Status: DC

## 2018-02-22 MED ORDER — POTASSIUM CHLORIDE CRYS ER 20 MEQ PO TBCR
40.0000 meq | EXTENDED_RELEASE_TABLET | Freq: Once | ORAL | Status: DC
Start: 2018-02-22 — End: 2018-02-22

## 2018-02-22 MED ORDER — APIXABAN 5 MG PO TABS: 5.0000 mg | ORAL_TABLET | Freq: Two times a day (BID) | ORAL | 5 refills | Status: DC

## 2018-02-22 MED ORDER — AMLODIPINE-OLMESARTAN 5-40 MG PO TABS: 1.0000 | ORAL_TABLET | Freq: Every day | ORAL | 5 refills | Status: DC

## 2018-02-22 MED ORDER — APIXABAN (ELIQUIS) EDUCATION KIT FOR DVT/PE PATIENTS: 1.0000 | PACK | Freq: Once | 0 refills | Status: AC

## 2018-02-22 MED ORDER — AZITHROMYCIN 500 MG PO TABS: 500.0000 mg | ORAL_TABLET | Freq: Every day | ORAL | 0 refills | Status: AC

## 2018-02-22 MED ORDER — FUROSEMIDE 40 MG PO TABS: 40.0000 mg | ORAL_TABLET | Freq: Every day | ORAL | 3 refills | Status: DC

## 2018-02-22 MED ORDER — POTASSIUM CHLORIDE CRYS ER 20 MEQ PO TBCR
40.0000 meq | EXTENDED_RELEASE_TABLET | Freq: Once | ORAL | Status: AC
  Administered 2018-02-22: 40 meq via ORAL
  Filled 2018-02-22: qty 2

## 2018-02-22 MED ORDER — CEFDINIR 300 MG PO CAPS: 300.0000 mg | ORAL_CAPSULE | Freq: Two times a day (BID) | ORAL | 0 refills | Status: AC

## 2018-02-22 NOTE — Care Management Note (Addendum)
Case Management Note  Patient Details  Name: Brandi Howell MRN: [id number] Date of Birth: 12-11-1965  Subjective/Objective:   Admitted for Acute respiratory failure with hypoxia and hypercapnia due to dCHF/PE/PNA.               Action/Plan: Received referral regarding patient needing CPAP machine, unfortunately we no longer provide those inpatient. Patient will need to follow up with Provider who ordered the Sleep study or needs to see Pulmonologist.  Received referral for DME: oxygen, referral called to [facility] discharge liasion with [facility].  In to speak with patient, NCM provided patient 30 day free card for Eliquis.  Expected Discharge Date:    02/22/2018              Expected Discharge Plan:  Home w Home Health Services  In-House Referral:   N/A  Discharge planning Services  CM Consult  Post Acute Care Choice:  Durable Medical Equipment Choice offered to:  Patient  DME Arranged:  Oxygen DME Agency:  [facility]  Status of Service:  In process, will continue to follow  Yancey Flemings, RN 02/22/2018, 10:19 AM

## 2018-02-22 NOTE — Progress Notes (Signed)
Discharge education reviewed with patient. IV and telemetry removed. Patient's oxygen tank has been delivered to the room.

## 2018-02-22 NOTE — Progress Notes (Addendum)
BENEFIT CHECK Per CMA: Memory Argue         # 5.  S/W SABRINA @ [facility] # [id number]     ELIQUIS 5 MG BID  COVER- YES  CO-PY- $ 383.00  TIER- 2 DRUG  PRIOR APPROVAL- NO   DEDUCTIBLE : NOT MET   PREFERRED PHARMACY :  YES- [facility]

## 2018-02-22 NOTE — Progress Notes (Signed)
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  Pharmacy confirmed it is in regards to the starter pack, kit and first dose to be started 02/22/2018. Primary RN confirmed case manager has provided pt with coupon.

## 2018-02-22 NOTE — Discharge Instructions (Signed)
1) use oxygen via nasal cannula continuously while awake and while asleep, use portable oxygen while out of the house 2) follow-up with your pulmonologist as soon as possible to get his CPAP--- you are at risk for carbon dioxide retention with hypercapnia and lethargy if he did not get his CPAP 3)Avoid ibuprofen/Advil/Aleve/Motrin/Goody Powders/Naproxen/BC powders/Meloxicam/Diclofenac/Indomethacin and other Nonsteroidal anti-inflammatory medications as these will make you more likely to bleed and can cause stomach ulcers, can also cause Kidney problems.  4) take Eliquis/apixaban blood thinner as advised for at least 6 months, you might need to be on it lifelong 5) call if any bleeding concerns 6) very low-salt diet advised due to congestive heart failure 7) avoid excessive fluid intake due to congestive heart failure limiting fluid intake to 1.5 L/day 8) reduced caloric intake, increased activity levels advised--- your body mass index/BMI is over 50, this is associated with increased mortality [more likely to die prematurely]  ============================================================================================  Information on my medicine - ELIQUIS (apixaban)  Why was Eliquis prescribed for you? Eliquis was prescribed to treat blood clots that may have been found in the veins of your legs (deep vein thrombosis) or in your lungs (pulmonary embolism) and to reduce the risk of them occurring again.  What do You need to know about Eliquis ? The starting dose is 10 mg (two 5 mg tablets) taken TWICE daily for the FIRST SEVEN (7) DAYS, then on (enter date)  03/01/18 in the evening  the dose is reduced to ONE 5 mg tablet taken TWICE daily.  Eliquis may be taken with or without food.   Try to take the dose about the same time in the morning and in the evening. If you have difficulty swallowing the tablet whole please discuss with your pharmacist how to take the medication safely.  Take Eliquis  exactly as prescribed and DO NOT stop taking Eliquis without talking to the doctor who prescribed the medication.  Stopping may increase your risk of developing a new blood clot.  Refill your prescription before you run out.  After discharge, you should have regular check-up appointments with your healthcare provider that is prescribing your Eliquis.    What do you do if you miss a dose? If a dose of ELIQUIS is not taken at the scheduled time, take it as soon as possible on the same day and twice-daily administration should be resumed. The dose should not be doubled to make up for a missed dose.  Important Safety Information A possible side effect of Eliquis is bleeding. You should call your healthcare provider right away if you experience any of the following: ? Bleeding from an injury or your nose that does not stop. ? Unusual colored urine (red or dark brown) or unusual colored stools (red or black). ? Unusual bruising for unknown reasons. ? A serious fall or if you hit your head (even if there is no bleeding).  Some medicines may interact with Eliquis and might increase your risk of bleeding or clotting while on Eliquis. To help avoid this, consult your healthcare provider or pharmacist prior to using any new prescription or non-prescription medications, including herbals, vitamins, non-steroidal anti-inflammatory drugs (NSAIDs) and supplements.  This website has more information on Eliquis (apixaban): http://[email or web address].[email or web address]/home

## 2018-02-22 NOTE — Discharge Summary (Signed)
Brandi Howell, is a 52 y.o. female  DOB 1965-10-04  MRN [id number].  Admission date:  02/19/2018  Admitting Physician  Lady Deutscher, MD  Discharge Date:  02/22/2018   Primary MD  Glendale Chard, MD  Recommendations for primary care physician for things to follow:   1) use oxygen via nasal cannula continuously while awake and while asleep, use portable oxygen while out of the house 2) follow-up with your pulmonologist as soon as possible to get his CPAP--- you are at risk for carbon dioxide retention with hypercapnia and lethargy if he did not get his CPAP 3)Avoid ibuprofen/Advil/Aleve/Motrin/Goody Powders/Naproxen/BC powders/Meloxicam/Diclofenac/Indomethacin and other Nonsteroidal anti-inflammatory medications as these will make you more likely to bleed and can cause stomach ulcers, can also cause Kidney problems.  4) take Eliquis/apixaban blood thinner as advised for at least 6 months, you might need to be on it lifelong 5) call if any bleeding concerns 6) very low-salt diet advised due to congestive heart failure 7) avoid excessive fluid intake due to congestive heart failure limiting fluid intake to 1.5 L/day 8) reduced caloric intake, increased activity levels advised--- your body mass index/BMI is over 50, this is associated with increased mortality [more likely to die prematurely]   Admission Diagnosis  Congestive heart failure (CHF) ([place]) [I50.9] Hypertension, unspecified type [I10] Acute congestive heart failure, unspecified heart failure type ([place]) [I50.9]   Discharge Diagnosis  Congestive heart failure (CHF) ([place]) [I50.9] Hypertension, unspecified type [I10] Acute congestive heart failure, unspecified heart failure type ([place]) [I50.9]    Principal Problem:   Acute respiratory failure with hypoxia and hypercapnia due to dCHF/PE/PNA Active Problems:   Asthma   Morbid obesity ([place])  Diabetes ([place])   Hyperlipemia   Goiter   Gout   Vitamin D deficiency   Hypertension   Acute exacerbation of CHF (congestive heart failure) (H[facility])   (HFpEF) heart failure with preserved ejection fraction ([place])   Pulmonary embolism ([place])   Pneumonia      Past Medical History:  Diagnosis Date  . Asthma   . Diabetes mellitus without complication ([place])   . Goiter   . Gout   . Hypercholesteremia   . Hypertension   . Vitamin D deficiency     Past Surgical History:  Procedure Laterality Date  . CHOLECYSTECTOMY    . KNEE ARTHROSCOPY    . KNEE ARTHROSCOPY Left 10/25/2017   Procedure: LEFT KNEE ARTHROSCOPY WITH MEDIAL MENISECTOMY, CHONDROPLASTY;  Surgeon: Frederik Pear, MD;  Location: [facility];  Service: Orthopedics;  Laterality: Left;  . TUBAL LIGATION         HPI  from the history and physical done on the day of admission:    Chief Complaint: Shortness of breath  HPI: Brandi Howell is a 52 y.o. female with medical history significant for asthma, diabetes, OSA, hypertension, hyperlipidemia, goiter, gout, presenting with 1 week history of progressive shortness of breath, and dry cough.  She felt like her airway was getting "clogged", worse when lying down, with ambulation.  She  denies rhinorrhea, or hemoptysis.  She denies any fever, chills, night sweats or mucositis.  She denies any chest pain, chest wall pain or palpitations.  She denies any sick contacts or recent long distance travel.  No history of PE or DVT.  She denies any abdominal pain nausea or vomiting.  The patient has decreased appetite due to the current symptoms.  She denies any dizziness, vertigo, syncope or presyncope.  She denies any worsening lower extremity swelling or calf pain.  No confusion is reported.  She denies any vision changes, double vision or headaches.  Of note, the patient is compliant with her medications.  Changes in them.  She denies any food indiscretion, but has gained about 8 pounds over the last  week.  She denies any tobacco, alcohol or recreational drug use.  No recent long distance trips.    ED Course:  BP (!) 187/78   Pulse 94   Temp 99.8 F (37.7 C) (Oral)   Resp 11   SpO2 95%   On presentation, her O2 sats were down in the 60s, and 70s, and placed on 5 L of nasal cannula, with improvement of her symptoms.  Currently, and in the mid 90s on 3 L..  She did not necessitate BiPAP use. Glucose 252, creatinine 1.08, white count 13.3, hemoglobin 11.5,   troponin 0.1.   BNP 123.6. Chest x-ray shows vascular congestion, heart is markedly enlarged consistent with CHF, with central and basilar pulmonary edema.  There is a soft tissue prominence in the right hilar region, with no pneumothorax.  There is no pleural effusion. EKG shows sinus tachycardia, nonspecific ST abnormality, without significant change since last tracing. Did not receive paste, and Lasix injection 40 mg x 1, with significant diuresis, and in improvement of her symptoms. Note, on 01/20/2018, the patient had a needle aspiration for a thyroid follicular nodule, which was benign    Hospital Course:    Brief summary 52 y.o.femalewith medical history significant forasthma, diabetes, OSA, hypertension, hyperlipidemia presenting with 1 week history of progressive shortness of breath, and dry cough admitted on 02/19/18 with presumed new onset diastolic dysfunction CHF, CTA chest from 02/20/2018 with bilateral PE and multifocal pneumonia.    Plan:- 1)Acute hypoxic and hypercapnic respiratory failure----ABG on room air done on 02/20/2018 after diuresis shows PCO2 of 36 and PCO2 of 57, with a pH of 7.42 and bicarb of 36.2 suggesting some chronicity of her respiratory acidosis----  Be judicious with oxygen as patient is a CO2 retainer,  hypoxia has improved significantly, respiratory status overload improved,   2)HFpEF/Elevated Troponin -shortness of breath mostly resolved with diuresis, and supplemental oxygen, dyspnea on  exertion much improved, hypoxia has improved --- Echo with EF of 60 to 65%, suspect acute diastolic CHF---   troponin is borderline elevated but otherwise flat, echo without regional wall motion abnormalities, suspect troponin elevation secondary to demand ischemia in the setting of acute CHF rather than frank ACS.  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 acute hypoxic respiratory failure has improved,--  CTA chest confirmed pulmonary embolism, venous Dopplers without acute DVT,   5)HTN-BP control is improving, continue Avapro 300 mg  daily, amlodipine 10 mg daily,     6)DM-poor control, A1c just above 8, continue Insulin Degludec-Liraglutide (XULTOPHY) 34 units daily, Use Novolog/Humalog Sliding scale insulin with Accu-Cheks/Fingersticks as ordered   7) moderate persistent asthma-does not appear to be an asthma flareup at this time, hold off on steroids, continue bronchodilators and singular  8) multifocal pneumonia/CAP-hypoxia is most likely due to CHF and PE, pneumonia probably contributory,  treated with Rocephin/azithromycin and bronchodilators with mucolytics.  Patient is afebrile, leukocytosis has resolved.  Discharge home on p.o. azithromycin and Omnicef  9)Dispo-discharge home on 02/22/2018 with home O2 and Eliquis  Code Status : Full   Discharge Condition: stable  Follow UP  Follow-up Information    Glendale Chard, MD Follow up.   Specialty:  Internal Medicine Contact information: [address] [place] [address] [phone]          Diet and Activity recommendation:  As  advised  Discharge Instructions     Discharge Instructions    (HEART FAILURE PATIENTS) Call MD:  Anytime you have any of the following symptoms: 1) 3 pound weight gain in 24 hours or 5 pounds in 1 week 2) shortness of breath, with or without a dry hacking cough 3) swelling in the hands, feet or stomach 4) if you have to sleep on extra pillows at night in order to breathe.   Complete by:  As directed    AMB referral to CHF clinic   Complete by:  As directed    Call MD for:  difficulty breathing, headache or visual disturbances   Complete by:  As directed    Call MD for:  persistant dizziness or light-headedness   Complete by:  As directed    Call MD for:  persistant nausea and vomiting   Complete by:  As directed    Call MD for:  severe uncontrolled pain   Complete by:  As directed    Call MD for:  temperature >100.4   Complete by:  As directed    Diet - low sodium heart healthy   Complete by:  As directed    Discharge instructions   Complete by:  As directed    1) use oxygen via nasal cannula continuously while awake and while asleep, use portable oxygen while out of the house 2) follow-up with your pulmonologist as soon as possible to get his CPAP--- you are at risk for carbon dioxide retention with hypercapnia and lethargy if he did not get his CPAP 3)Avoid ibuprofen/Advil/Aleve/Motrin/Goody Powders/Naproxen/BC powders/Meloxicam/Diclofenac/Indomethacin and other Nonsteroidal anti-inflammatory medications as these will make you more likely to bleed and can cause stomach ulcers, can also cause Kidney problems.  4) take Eliquis/apixaban blood thinner as advised for at least 6 months, you might need to be on it lifelong 5) call if any bleeding concerns 6) very low-salt diet advised due to congestive heart failure 7) avoid excessive fluid intake due to congestive heart failure limiting fluid intake to 1.5 L/day 8) reduced caloric intake, increased activity levels advised--- your body mass  index/BMI is over 50, this is associated with increased mortality [more likely to die prematurely]   Increase activity slowly   Complete by:  As directed         Discharge Medications     Allergies as of 02/22/2018      Reactions   Lisinopril Cough   Statins Other (See Comments)   Cause muscle weakness      Medication List    STOP taking  these medications   aspirin-acetaminophen-caffeine 250-250-65 MG tablet Commonly known as:  EXCEDRIN MIGRAINE   ibuprofen 200 MG tablet Commonly known as:  ADVIL,MOTRIN   naproxen sodium 220 MG tablet Commonly known as:  ALEVE   predniSONE 10 MG tablet Commonly known as:  DELTASONE     TAKE these medications   allopurinol 100 MG tablet Commonly known as:  ZYLOPRIM Take 100 mg by mouth daily.   amLODipine-olmesartan 5-40 MG tablet Commonly known as:  AZOR Take 1 tablet by mouth daily.   apixaban Kit Commonly known as:  ELIQUIS 1 kit by Does not apply route once for 1 dose.   azithromycin 500 MG tablet Commonly known as:  ZITHROMAX Take 1 tablet (500 mg total) by mouth daily for 5 days. What changed:    medication strength  how much to take  when to take this  additional instructions   budesonide-formoterol 160-4.5 MCG/ACT inhaler Commonly known as:  SYMBICORT Inhale 2 puffs into the lungs 2 (two) times daily.   cefdinir 300 MG capsule Commonly known as:  OMNICEF Take 1 capsule (300 mg total) by mouth 2 (two) times daily for 7 days.   cetirizine 10 MG tablet Commonly known as:  ZYRTEC Take 10 mg by mouth daily.   CORICIDIN HBP 10-325-2 MG Tabs Generic drug:  DM-APAP-CPM Take 1 tablet by mouth every 4 (four) hours as needed (cough/ cold symptoms).   ELIQUIS STARTER PACK 5 MG Tabs Take as directed on package: start with two-5m tablets twice daily for 7 days. On day 8, switch to one-523mtablet twice daily.   apixaban 5 MG Tabs tablet Commonly known as:  ELIQUIS Take 1 tablet (5 mg total) by mouth 2 (two)  times daily. Start around 03/24/18 after finishing the starter pack   FISH OIL PO Take 1 capsule by mouth 2 (two) times a week.   fluticasone 50 MCG/ACT nasal spray Commonly known as:  FLONASE Place 2 sprays into both nostrils daily.   furosemide 40 MG tablet Commonly known as:  LASIX Take 1 tablet (40 mg total) by mouth daily. What changed:    medication strength  how much to take   guaiFENesin 600 MG 12 hr tablet Commonly known as:  MUCINEX Take 1 tablet (600 mg total) by mouth 2 (two) times daily.   HYDROcodone-acetaminophen 5-325 MG tablet Commonly known as:  NORCO Take 1 tablet by mouth every 6 (six) hours as needed.   lactobacillus acidophilus & bulgar chewable tablet Chew 1 tablet by mouth 3 (three) times daily with meals.   MIDOL COMPLETE 500-60-15 MG Tabs Generic drug:  Acetaminophen-Caff-Pyrilamine Take 1 capsule by mouth daily as needed (cramps).   montelukast 10 MG tablet Commonly known as:  SINGULAIR Take 10 mg by mouth daily.   multivitamin with minerals Tabs tablet Take 1 tablet by mouth daily.   PROAIR HFA 108 (90 Base) MCG/ACT inhaler Generic drug:  albuterol Inhale 2 puffs into the lungs every 6 (six) hours as needed for wheezing or shortness of breath.   VITAMIN B1 PO Take 1 tablet by mouth daily.   XULTOPHY 100-3.6 UNIT-MG/ML Sopn Generic drug:  Insulin Degludec-Liraglutide Inject 34 Units into the skin daily before breakfast.            Durable Medical Equipment  (From admission, onward)        Start     Ordered   02/22/18 1202  DME Oxygen  Once    Comments:  O2 sats 82% on room air  On 2 L of oxygen via nasal cannula O2 sats up to 92%  Patient needs oxygen via nasal cannula at 2 L/min continuously with gaseous portability on conserving device  Diagnosis is congestive heart failure  Question Answer Comment  Mode or (Route) Nasal cannula   Liters per Minute 2   Frequency Continuous (stationary and portable oxygen unit needed)    Oxygen conserving device Yes   Oxygen delivery system Gas      02/22/18 1207   02/22/18 0700  For home use only DME oxygen  Once    Comments:  Oxygen saturation 82% on room air at rest Oxygen saturation on 3 L of oxygen via nasal cannula is up to 93%  Diagnosis hypoxic respiratory failure secondary to congestive heart failure  Patient needs oxygen via nasal cannula at 3 L/min continuously with gaseous portability on conserving device  Question Answer Comment  Mode or (Route) Nasal cannula   Liters per Minute 3   Frequency Continuous (stationary and portable oxygen unit needed)   Oxygen conserving device Yes   Oxygen delivery system Gas      02/21/18 1426   02/21/18 1314  For home use only DME continuous positive airway pressure (CPAP)  Once    Comments:  patient also needs CPAP nightly at home, she already had a sleep study couple months ago  Question Answer Comment  Patient has OSA or probable OSA Yes   Is the patient currently using CPAP in the home No   If yes (to question two) Determine DME provider and inform them of any new orders/settings   Date of face to face encounter 02/21/18   Settings Autotitration   Signs and symptoms of probable OSA  (select all that apply) Snoring   Signs and symptoms of probable OSA  (select all that apply) Witnessed apneas   Signs and symptoms of probable OSA  (select all that apply) Gasping during sleep   CPAP supplies needed Mask, headgear, cushions, filters, heated tubing and water chamber      02/21/18 1314      Major procedures and Radiology Reports - PLEASE review detailed and final reports for all details, in brief -    Dg Chest 2 View  Result Date: 02/20/2018 CLINICAL DATA:  Congestive heart failure EXAM: CHEST - 2 VIEW COMPARISON:  02/19/2018 FINDINGS: Moderate enlargement of the cardiopericardial silhouette noted with indistinctness of pulmonary vasculature, bilateral interstitial accentuation slightly worsened than the previous  exam, and some faint right perihilar and likely left lower lobe airspace opacities. No definite blunting of the costophrenic angles. No appreciable bony abnormality. IMPRESSION: 1. Moderate enlargement of the cardiopericardial silhouette with interstitial pulmonary edema and early right perihilar and left basilar airspace edema. Electronically Signed   By: Van Clines M.D.   On: 02/20/2018 09:06   Dg Chest 2 View  Result Date: 02/19/2018 CLINICAL DATA:  Chest pain EXAM: CHEST - 2 VIEW COMPARISON:  None. FINDINGS: The heart is markedly enlarged. Vascular congestion is present. Central and basilar airspace disease. There is soft tissue prominence in the right hilar region no pneumothorax. No pleural effusion. IMPRESSION: CHF with central and basilar pulmonary edema. Soft tissue prominence in the right hilum. Hilar mass is not excluded. Attention on follow-up radiographs is recommended. Electronically Signed   By: Marybelle Killings M.D.   On: 02/19/2018 12:33   Ct Angio Chest Pe W Or Wo Contrast  Result Date: 02/20/2018 CLINICAL DATA:  Positive D-dimer. Clinical suspicion for pulmonary embolus. EXAM: CT  ANGIOGRAPHY CHEST WITH CONTRAST TECHNIQUE: Multidetector CT imaging of the chest was performed using the standard protocol during bolus administration of intravenous contrast. Multiplanar CT image reconstructions and MIPs were obtained to evaluate the vascular anatomy. CONTRAST:  149m ISOVUE-370 IOPAMIDOL (ISOVUE-370) INJECTION 76% COMPARISON:  Chest radiograph E 02/20/2018 FINDINGS: Cardiovascular: Satisfactory opacification of the pulmonary arteries to the segmental level. Small nonocclusive pulmonary emboli within segmental and subsegmental branches of the basilar segments of the left lower lobe. Suspected subsegmental nonocclusive pulmonary emboli in the right lower lobe. No evidence of right heart strain. No pericardial effusion. Grossly normal thoracic aorta. Mediastinum/Nodes: No enlarged mediastinal,  hilar, or axillary lymph nodes. Thyroid gland, trachea, and esophagus demonstrate no significant findings. Lungs/Pleura: Bilateral patchy areas of airspace consolidation throughout both lungs with upper lobe predominance. Upper Abdomen: No acute abnormality. Musculoskeletal: No chest wall abnormality. No acute or significant osseous findings. Review of the MIP images confirms the above findings. IMPRESSION: Scattered nonobstructive pulmonary emboli within segmental and subsegmental branches of the basilar segments of the left lower lobe. Suspected subsegmental nonocclusive pulmonary emboli in the right lower lobe. No evidence of right heart strain. Multifocal bilateral airspace consolidation with upper lobe predominance. In the appropriate clinical setting, this likely represents multifocal pneumonia. Some of the peripheral areas of consolidation may potentially represent pulmonary infarctions, however the degree and distribution of airspace consolidation is out of proportion to the small peripheral pulmonary emboli seen, and therefore infectious etiology is favored. These results were called by telephone at the time of interpretation on 02/20/2018 at 3:01 pm to Dr. CRoxan Hockey, who verbally acknowledged these results. Electronically Signed   By: DFidela SalisburyM.D.   On: 02/20/2018 15:02    Micro Results   No results found for this or any previous visit (from the past 240 hour(s)).     Today   Subjective    KOceane Fossetoday has no concerns, shortness of breath is better, no chest pains          Patient has been seen and examined prior to discharge   Objective   Blood pressure (!) 143/78, pulse 75, temperature 98.4 F (36.9 C), temperature source Oral, resp. rate (!) 22, height 5' 4" (1.626 m), weight (!) 136.7 kg (301 lb 6.4 oz), SpO2 100 %.   Intake/Output Summary (Last 24 hours) at 02/22/2018 1208 Last data filed at 02/22/2018 1000 Gross per 24 hour  Intake 1450 ml   Output 1700 ml  Net -250 ml    Exam Gen:- Awake Alert, morbidly obese, speaks in complete sentences HEENT:- [place].AT, No sclera icterus Nose-nasal cannula at 2 L/min Neck-Supple Neck,No JVD,.  Lungs-improved air movement  , no wheezing  CV- S1, S2 normal Abd-  +ve B.Sounds, Abd Soft, No tenderness, significantly increased truncal adiposity Extremity/Skin:-Trace to 1+ pitting edema, good pulses Psych-affect is appropriate, oriented x3 Neuro-no new focal deficits, no tremors     Data Review   CBC w Diff:  Lab Results  Component Value Date   WBC 8.8 02/21/2018   HGB 11.6 (L) 02/21/2018   HCT 39.6 02/21/2018   PLT 204 02/21/2018    CMP:  Lab Results  Component Value Date   NA 141 02/22/2018   K 3.0 (L) 02/22/2018   CL 96 (L) 02/22/2018   CO2 36 (H) 02/22/2018   BUN 10 02/22/2018   CREATININE 0.73 02/22/2018  .   Total Discharge time is about 33 minutes  CRoxan HockeyM.D on 02/22/2018 at 12:08 PM  [facility]   Office  [phone]  Voice Recognition Viviann Spare dictation system was used to create this note, attempts have been made to correct errors. Please contact the author with questions and/or clarifications.

## 2018-02-25 ENCOUNTER — Ambulatory Visit (INDEPENDENT_AMBULATORY_CARE_PROVIDER_SITE_OTHER): Payer: BLUE CROSS/BLUE SHIELD | Admitting: Neurology

## 2018-02-25 DIAGNOSIS — I5030 Unspecified diastolic (congestive) heart failure: Secondary | ICD-10-CM | POA: Diagnosis not present

## 2018-02-25 DIAGNOSIS — G4733 Obstructive sleep apnea (adult) (pediatric): Secondary | ICD-10-CM | POA: Diagnosis not present

## 2018-02-25 DIAGNOSIS — E662 Morbid (severe) obesity with alveolar hypoventilation: Secondary | ICD-10-CM

## 2018-02-25 DIAGNOSIS — G4734 Idiopathic sleep related nonobstructive alveolar hypoventilation: Secondary | ICD-10-CM

## 2018-02-25 DIAGNOSIS — Z6841 Body Mass Index (BMI) 40.0 and over, adult: Secondary | ICD-10-CM

## 2018-02-28 NOTE — Procedures (Signed)
PATIENT'S NAME:  Brandi Howell, Brandi Howell DOB:      02/24/1966      MR#:    [id number]     DATE OF RECORDING: 02/25/2018 REFERRING M.D.:  Dorothyann Peng, M.D. Study Performed:   Titration to Positive Airway Pressure HISTORY: Mrs. Brame carries the diagnosis of diabetes, morbid obesity, essential hypertension, hypercholesterolemia, and asthma. Her main concern is that she wakes up neither rested nor restored. She often wakes up with headaches. She is sleepy while driving. Epworth Sleepiness score was endorsed at 15 /24 points. The patient's weight 309 pounds with a height of 63 (inches), resulting in a BMI of 54.7 kg/m2. The preceding SPLIT night study performed on 11/07/2017 revealed: Most Severe Obstructive Sleep Apnea (OSA), leading to severely fragmented sleep with an AHI of 147.5/h. Very severe Hypoxia, by SpO2 Nadir ( SpO2 of 66%), total desaturation time below 89% saturation (80 minutes out of 83 minutes sleep time), and length of desaturation events. Obesity hypoventilation syndrome is suspected. CPAP titration time was too restricted to allow full titration- the sleep efficiency was only 38%. The technologist was advised to consider application of all PAP modalities and oxygen as needed.    CURRENT MEDICATIONS: Allopurinol, Amlodipine, Arginine, Furosemide, Montelukast, and Multi-Vitamin X PROCEDURE:  This is a multichannel digital polysomnogram utilizing the SomnoStar 11.2 system.  Electrodes and sensors were applied and monitored per AASM Specifications.   EEG, EOG, Chin and Limb EMG, were sampled at 200 Hz.  ECG, Snore and Nasal Pressure, Thermal Airflow, Respiratory Effort, CPAP Flow and Pressure, Oximetry was sampled at 50 Hz. Digital video and audio were recorded.       CPAP was initiated at 5 cmH20 with heated humidity per AASM split night standards and pressure was advanced to 15 cm H20 with 2 cm EPR because of hypopneas, apneas and desaturations.  At a PAP pressure of 11 cmH20 with 2 cm  EPR, there was a reduction of the AHI to 3.0 /h with improvement of sleep apnea.  The sleep efficiency was 93 % at this setting. Oxygen nadir remained low at 85% and became lower with increasing pressures. Oxygen was applied at 2l/min. based on the first titration attempt which revealed no response to CPAP alone.    Lights Out was at 22:16 and Lights On at 05:13. Total recording time (TRT) was 417 minutes, with a total sleep time (TST) of 257 minutes. The patient's sleep latency was 46 minutes. REM latency was 172.5 minutes. The overall sleep efficiency was 61.6 %.    SLEEP ARCHITECTURE: WASO (Wake after sleep onset) was 131.5 minutes.  There were 20.5 minutes in Stage N1, 144 minutes Stage N2, 21 minutes Stage N3 and 71.5 minutes in Stage REM.  The percentage of Stage N1 was 8.%, Stage N2 was 56.%, Stage N3 was 8.2% and Stage R (REM sleep) was 27.8%.   Sleep architecture was notable for highest sleep efficiency between 0.45 AM and 5 AM. Sleep before was severely fragmented. The patient was looking at her phone between 23.44 hours and 0.32 AM.    RESPIRATORY ANALYSIS:  There was a total of 33 respiratory events: 17 obstructive apneas, 0 central apneas and 16 hypopneas with 0 respiratory event related arousals (RERAs).      The total APNEA/HYPOPNEA INDEX (AHI) was 7.7 /hour and the total RESPIRATORY DISTURBANCE INDEX was 7.7/hour  16 events occurred in REM sleep and 17 events in NREM. The REM AHI was 13.4 /hour versus a non-REM AHI of 5.5/hour.  The  patient spent 41 minutes of total sleep time in the supine position and 216 minutes in non-supine.  The supine AHI was 11.7, versus a non-supine AHI of 6.9.  OXYGEN SATURATION & C02:  The baseline 02 saturation was 86%, with the lowest being 68%. Time spent below 89% saturation equaled 29 minutes.  PERIODIC LIMB MOVEMENTS:  The patient had a total of 0 Periodic Limb Movements.  The arousals were noted as: 36 were spontaneous, 0 were associated with PLMs,  and 3 were associated with respiratory events. Audio and video analysis did not show any abnormal or unusual movements, behaviors, phonations or vocalizations.   The patient took 2 bathroom breaks. Snoring was noted. EKG was in keeping with normal sinus rhythm (NSR). Post-study, the patient indicated that sleep was the same as usual.  The patient was fitted with a Medium sized DreamWear Full Face Mask.   DIAGNOSIS 1. Obstructive Sleep Apnea treated with CPAP 11 cm water, 2 cm EPR, medium sized Dream Wear full face mask and oxygen at 2 liters/min.   2. Sleep Related Hypoxemia- likely obesity hypoventilation.  3. Insomnia maintained by sleep habits.  PLANS/RECOMMENDATIONS: The patient will be prescribed CPAP as an auto-titration capable device, set to 11 cm water, 2 cm EPR, heated humidity, and 2 liters of oxygen.    DISCUSSION: Follow up in the sleep clinic after 60-90 days of CPAP use.    [facility]   Please call [phone] with any questions.      I certify that I have reviewed the entire raw data recording prior to the issuance of this report in accordance with the Standards of Accreditation of the American Academy of Sleep Medicine (AASM)      Melvyn Novasarmen Lyndon Chenoweth, M.D.    02-28-2018 Diplomat, American Board of Psychiatry and Neurology  Diplomat, American Board of Sleep Medicine Medical Director, [facility]

## 2018-02-28 NOTE — Addendum Note (Signed)
Addended by: Melvyn NovasHMEIER, Rathana Viveros on: 02/28/2018 01:27 PM   Modules accepted: Orders

## 2018-03-01 ENCOUNTER — Telehealth: Payer: Self-pay | Admitting: Neurology

## 2018-03-01 DIAGNOSIS — I2699 Other pulmonary embolism without acute cor pulmonale: Secondary | ICD-10-CM | POA: Diagnosis not present

## 2018-03-01 DIAGNOSIS — Z8709 Personal history of other diseases of the respiratory system: Secondary | ICD-10-CM | POA: Diagnosis not present

## 2018-03-01 DIAGNOSIS — I119 Hypertensive heart disease without heart failure: Secondary | ICD-10-CM | POA: Diagnosis not present

## 2018-03-01 DIAGNOSIS — I5031 Acute diastolic (congestive) heart failure: Secondary | ICD-10-CM | POA: Diagnosis not present

## 2018-03-01 DIAGNOSIS — I110 Hypertensive heart disease with heart failure: Secondary | ICD-10-CM | POA: Diagnosis not present

## 2018-03-01 DIAGNOSIS — Z79899 Other long term (current) drug therapy: Secondary | ICD-10-CM | POA: Diagnosis not present

## 2018-03-01 NOTE — Telephone Encounter (Signed)
Pt has returned the call to RN Casey, please call °

## 2018-03-01 NOTE — Telephone Encounter (Signed)
I called pt. I advised pt that Dr. Vickey Hugerohmeier reviewed their sleep study results and found that pt has sleep apnea that was treated well at a pressure of 11 cm of water pressure as well as 2 L of oxygen. Dr. Vickey Hugerohmeier recommends that pt starts this machine with oxygen bled into it. I reviewed PAP compliance expectations with the pt. Pt is agreeable to starting a CPAP. I advised pt that an order will be sent to a DME, [facility], and [facility] will call the pt within about one week after they file with the pt's insurance. [facility] will show the pt how to use the machine, fit for masks, and troubleshoot the CPAP if needed. A follow up appt was made for insurance purposes with Dr. Vickey Hugerohmeier on 06/07/2018 at 2:30 pm. Pt verbalized understanding to arrive 15 minutes early and bring their CPAP. A letter with all of this information in it will be mailed to the pt as a reminder. I verified with the pt that the address we have on file is correct. Pt verbalized understanding of results. Pt had no questions at this time but was encouraged to call back if questions arise.

## 2018-03-01 NOTE — Telephone Encounter (Signed)
Called patient to discuss sleep study results. No answer at this time. LVM for the patient to call back.   

## 2018-03-01 NOTE — Telephone Encounter (Signed)
-----   Message from Melvyn Novasarmen Dohmeier, MD sent at 02/28/2018  1:27 PM EDT ----- DIAGNOSIS 1. Obstructive Sleep Apnea treated with CPAP 11 cm water, 2 cm  EPR, medium sized Dream Wear full face mask and oxygen at 2  liters/min.  2. Sleep Related Hypoxemia- likely obesity hypoventilation.  3. Insomnia maintained by sleep habits.  PLANS/RECOMMENDATIONS: The patient will be prescribed CPAP as an  auto-titration capable device, set to 11 cm water, 2 cm EPR,  heated humidity, and 2 liters of oxygen.   DISCUSSION: Follow up in the sleep clinic after 60-90 days of  CPAP use.

## 2018-03-10 DIAGNOSIS — G4733 Obstructive sleep apnea (adult) (pediatric): Secondary | ICD-10-CM | POA: Diagnosis not present

## 2018-03-16 DIAGNOSIS — E049 Nontoxic goiter, unspecified: Secondary | ICD-10-CM | POA: Diagnosis not present

## 2018-03-16 DIAGNOSIS — E042 Nontoxic multinodular goiter: Secondary | ICD-10-CM | POA: Diagnosis not present

## 2018-03-25 DIAGNOSIS — I5030 Unspecified diastolic (congestive) heart failure: Secondary | ICD-10-CM | POA: Diagnosis not present

## 2018-03-25 DIAGNOSIS — Z6841 Body Mass Index (BMI) 40.0 and over, adult: Secondary | ICD-10-CM | POA: Diagnosis not present

## 2018-03-25 DIAGNOSIS — Z01419 Encounter for gynecological examination (general) (routine) without abnormal findings: Secondary | ICD-10-CM | POA: Diagnosis not present

## 2018-03-25 DIAGNOSIS — Z1231 Encounter for screening mammogram for malignant neoplasm of breast: Secondary | ICD-10-CM | POA: Diagnosis not present

## 2018-03-27 DIAGNOSIS — I5030 Unspecified diastolic (congestive) heart failure: Secondary | ICD-10-CM | POA: Diagnosis not present

## 2018-04-05 ENCOUNTER — Ambulatory Visit (INDEPENDENT_AMBULATORY_CARE_PROVIDER_SITE_OTHER): Payer: BLUE CROSS/BLUE SHIELD | Admitting: Internal Medicine

## 2018-04-05 ENCOUNTER — Ambulatory Visit (INDEPENDENT_AMBULATORY_CARE_PROVIDER_SITE_OTHER)
Admission: RE | Admit: 2018-04-05 | Discharge: 2018-04-05 | Disposition: A | Discharge status: Home / Self Care | Payer: BLUE CROSS/BLUE SHIELD | Source: Ambulatory Visit | Attending: Internal Medicine | Admitting: Internal Medicine

## 2018-04-05 ENCOUNTER — Encounter: Payer: Self-pay | Admitting: Internal Medicine

## 2018-04-05 VITALS — BP 180/100 | HR 114 | Ht 62.0 in | Wt 296.0 lb

## 2018-04-05 DIAGNOSIS — J189 Pneumonia, unspecified organism: Secondary | ICD-10-CM | POA: Diagnosis not present

## 2018-04-05 DIAGNOSIS — R918 Other nonspecific abnormal finding of lung field: Secondary | ICD-10-CM

## 2018-04-05 DIAGNOSIS — J45991 Cough variant asthma: Secondary | ICD-10-CM

## 2018-04-05 DIAGNOSIS — I10 Essential (primary) hypertension: Secondary | ICD-10-CM | POA: Diagnosis not present

## 2018-04-05 DIAGNOSIS — J9602 Acute respiratory failure with hypercapnia: Secondary | ICD-10-CM | POA: Diagnosis not present

## 2018-04-05 DIAGNOSIS — J9612 Chronic respiratory failure with hypercapnia: Secondary | ICD-10-CM

## 2018-04-05 DIAGNOSIS — J9611 Chronic respiratory failure with hypoxia: Secondary | ICD-10-CM | POA: Diagnosis not present

## 2018-04-05 DIAGNOSIS — J9601 Acute respiratory failure with hypoxia: Secondary | ICD-10-CM

## 2018-04-05 NOTE — Progress Notes (Signed)
Brandi Howell, female    DOB: Feb 14, 1966,    MRN: [id number]   Brief patient profile:  30 yobf  never smoker onset of rhinitis in her 20's then trouble breathing in her mid 17s  Singulair/rn albuterol then L knee surgery Jan 2019 some swelling L> R knee swelling never really regained nl activity tol  then abrupt dry cough [name] sob/ center cp > UC > ER 02/10/18 with dx of suspected PE both lower lobes plus ? Edema vs pna by CT referred to pulmonary clinic 04/05/2018 by Dr   Melina Schools sanders to sort out why still sob/ 02 dep on rx for PE since 02/20/18   04/05/2018 Initial office eval  Brandi Howell re doe  Chief Complaint  Patient presents with  . Pulmonary Consult    Referred by Dr. Dorothyann Peng.  Pt states had PNA and PE in May 2019.  She states she is doing well as far as her breathing goes.    Dyspnea:  .MMRC2 = can't walk a nl pace on a flat grade s sob but does fine slow and flat on 1lpm sats ok  Cough: gone ow Sleep: on cpap sleeping fine 2lpm  SABA use: maybe once a day   No obvious day to day or daytime variability or assoc excess/ purulent sputum or mucus plugs or hemoptysis or cp or chest tightness, subjective wheeze or overt sinus or hb symptoms.   Sleeping as above without nocturnal  or early am exacerbation  of respiratory  c/o's or need for noct saba. Also denies any obvious fluctuation of symptoms with weather or environmental changes or other aggravating or alleviating factors except as outlined above   No unusual exposure hx or h/o childhood pna/ asthma or knowledge of premature birth.  Current Allergies, Complete Past Medical History, Past Surgical History, Family History, and Social History were reviewed in [employer] record.  ROS  The following are not active complaints unless bolded Hoarseness, sore throat, dysphagia, dental problems, itching, sneezing,  nasal congestion or discharge of excess mucus or purulent secretions, ear ache,   fever, chills,  sweats, unintended wt loss or wt gain, classically pleuritic or exertional cp,  orthopnea pnd or arm/hand swelling  or leg swelling, presyncope, palpitations, abdominal pain, anorexia, nausea, vomiting, diarrhea  or change in bowel habits or change in bladder habits, change in stools or change in urine, dysuria, hematuria,  rash, arthralgias, visual complaints, headache, numbness, weakness or ataxia or problems with walking or coordination,  change in mood= anxious or  memory.           Past Medical History:  Diagnosis Date  . Asthma   . Diabetes mellitus without complication (HCC)   . Goiter   . Gout   . Hypercholesteremia   . Hypertension   . Vitamin D deficiency     Outpatient Medications Prior to Visit  Medication Sig Dispense Refill  . albuterol (PROAIR HFA) 108 (90 Base) MCG/ACT inhaler Inhale 2 puffs into the lungs every 6 (six) hours as needed for wheezing or shortness of breath.    . allopurinol (ZYLOPRIM) 100 MG tablet Take 100 mg by mouth daily.    Marland Kitchen amLODipine-olmesartan (AZOR) 5-40 MG tablet Take 1 tablet by mouth daily. 30 tablet 5  . apixaban (ELIQUIS) 5 MG TABS tablet Take 1 tablet (5 mg total) by mouth 2 (two) times daily. Start around 03/24/18 after finishing the starter pack 60 tablet 5  . budesonide-formoterol (SYMBICORT) 160-4.5 MCG/ACT inhaler  Inhale 2 puffs into the lungs 2 (two) times daily.    . cetirizine (ZYRTEC) 10 MG tablet Take 10 mg by mouth daily.    . fluticasone (FLONASE) 50 MCG/ACT nasal spray Place 2 sprays into both nostrils daily.    . furosemide (LASIX) 40 MG tablet Take 1 tablet (40 mg total) by mouth daily. 30 tablet 3  . guaiFENesin (MUCINEX) 600 MG 12 hr tablet Take 1 tablet (600 mg total) by mouth 2 (two) times daily. 20 tablet 1  . lactobacillus acidophilus & bulgar (LACTINEX) chewable tablet Chew 1 tablet by mouth 3 (three) times daily with meals. 30 tablet 0  . montelukast (SINGULAIR) 10 MG tablet Take 10 mg by mouth daily.    . Multiple  Vitamin (MULTIVITAMIN WITH MINERALS) TABS tablet Take 1 tablet by mouth daily.    . Omega-3 Fatty Acids (FISH OIL PO) Take 1 capsule by mouth 2 (two) times a week.    . OXYGEN 2lpm 24/7 [facility]    . Semaglutide (OZEMPIC) 0.25 or 0.5 MG/DOSE SOPN 1 injection every wk    . UNABLE TO FIND Med Name: CPAP with o2 bled in 2lpm    . Acetaminophen-Caff-Pyrilamine (MIDOL COMPLETE) 500-60-15 MG TABS Take 1 capsule by mouth daily as needed (cramps).    Marland Kitchen. DM-APAP-CPM (CORICIDIN HBP) 10-325-2 MG TABS Take 1 tablet by mouth every 4 (four) hours as needed (cough/ cold symptoms).    Marland Kitchen. ELIQUIS STARTER PACK (ELIQUIS STARTER PACK) 5 MG TABS Take as directed on package: start with two-5mg  tablets twice daily for 7 days. On day 8, switch to one-5mg  tablet twice daily. 1 each 0  . HYDROcodone-acetaminophen (NORCO) 5-325 MG tablet Take 1 tablet by mouth every 6 (six) hours as needed. 30 tablet 0  . Insulin Degludec-Liraglutide (XULTOPHY) 100-3.6 UNIT-MG/ML SOPN Inject 34 Units into the skin daily before breakfast.    . Thiamine HCl (VITAMIN B1 PO) Take 1 tablet by mouth daily.     No facility-administered medications prior to visit.             Objective:     BP (!) 180/100 (BP Location: Right Arm, Cuff Size: Large)   Pulse (!) 114   Ht 5\' 2"  (1.575 m)   Wt 296 lb (134.3 kg)   SpO2 94%   BMI 54.14 kg/m   SpO2: 94 % O2 Type: Continuous O2 O2 Flow Rate (L/min): 1.5 L/min   Obese amb bf nad/ hbp noted    HEENT: nl dentition, turbinates bilaterally, and oropharynx. Nl external ear canals without cough reflex Modified Mallampati Score =   4   NECK :  without JVD/Nodes/TM/ nl carotid upstrokes bilaterally   LUNGS: no acc muscle use,  Nl contour chest which is clear to A and P bilaterally without cough on insp or exp maneuvers   CV:  RRR  no s3 or murmur or increase in P2, and no edema   ABD:  Very obese but soft and nontender with limited inspiratory excursion in the supine position. No bruits or  organomegaly appreciated, bowel sounds nl  MS:  Nl gait/ ext warm without deformities, calf tenderness, cyanosis or clubbing No obvious joint restrictions   SKIN: warm and dry without lesions    NEURO:  alert, approp, nl sensorium with  no motor or cerebellar deficits apparent.    CXR PA and Lateral:   04/05/2018 :    I personally reviewed images and  Impression is as follows:   Cm but marked improvement  in aeration bilerally vs prio 02/20/18   Labs  reviewed:      Chemistry      Component Value Date/Time   NA 141 02/22/2018 0555   K 3.0 (L) 02/22/2018 0555   CL 96 (L) 02/22/2018 0555   CO2 36 (H) 02/22/2018 0555   BUN 10 02/22/2018 0555   CREATININE 0.73 02/22/2018 0555      Component Value Date/Time   CALCIUM 8.7 (L) 02/22/2018 0555        Lab Results  Component Value Date   WBC 8.8 02/21/2018   HGB 11.6 (L) 02/21/2018   HCT 39.6 02/21/2018   MCV 84.1 02/21/2018   PLT 204 02/21/2018     Lab Results  Component Value Date   DDIMER 1.01 (H) 02/20/2018      Lab Results  Component Value Date   TSH 0.498 02/19/2018    BNP               124                  02/19/18            Assessment   Pulmonary infiltrates Strongly suspect the acute event/ infiltrates had nothing to do with PE based on the non-occlusive nature of the CTa findings, relatively low "clot burden" suggested by D dimer,  and the assoc GG changes which either represented pulmonary edema in pt with severe osa and diastolic dysfunction (thus the relatively low bnp setting, which can also be falsely low in MO pts)   In any case, doing better now and needs to work on rehab/wt loss and continue DOAC pending f/u by hematology for long range planning as MO is the long range issue, knee surgery the provoking event.   Cough variant asthma 04/05/2018  After extensive coaching inhaler device  effectiveness =    75% > continue symbicort 160 2bid   Adequate control on present rx, reviewed in detail with pt >  no change in rx needed  For now/ continue also singulair pending return for full pfts  Chronic respiratory failure with hypoxia and hypercapnia (HCC) Placed on 02 @ admit 02/20/18  [facility]    02/22/18  = 36  With nl tsh    Clearly this is obesity related, already on cpap so nothing to do different here than work on wt loss and f/u with pfts to sort out obst vs restrictive components   Acute respiratory failure with hypoxia and hypercapnia due to dCHF/PE/PNA See CTa 02/20/18 with non-occlusive PEs Echo 02/20/18 : Technically difficult study. 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 Venous Dopplers 02/20/18  Neg bilaterally / tds  Clearly has improved at this point but still 02 dep > see chronic resp failure a/p      Total time devoted to counseling  > 50 % of initial 60 min office visit:  review case with pt/ discussion of options/alternatives/ personally creating written customized instructions  in presence of pt  then going over those specific  Instructions directly with the pt including how to use all of the meds but in particular covering each new medication in detail and the difference between the maintenance= "automatic" meds and the prns using an action plan format for the latter (If this problem/symptom => do that organization reading Left to right).  Please see AVS from this visit for a full list of these instructions which I personally wrote  for this pt and  are unique to this visit.    See device teaching which extended face to face time for this visit    Essential hypertension Not optimally controlled on present regimen. I reviewed this with the patient and emphasized importance of follow-up with primary care.         Sandrea Hughs, MD 04/05/2018

## 2018-04-05 NOTE — Patient Instructions (Signed)
No change medications for now  Work on inhaler technique:  relax and gently blow all the way out then take a nice smooth deep breath back in, triggering the inhaler at same time you start breathing in.  Hold for up to 5 seconds if you can. Blow out thru nose. Rinse and gargle with water when done     Please remember to go to the  x-ray department downstairs in the basement  for your tests - we will call you with the results when they are available.      Please schedule a follow up office visit in 4 weeks, sooner if needed with pfts on return

## 2018-04-06 ENCOUNTER — Encounter: Payer: Self-pay | Admitting: Internal Medicine

## 2018-04-06 DIAGNOSIS — J9612 Chronic respiratory failure with hypercapnia: Secondary | ICD-10-CM | POA: Insufficient documentation

## 2018-04-06 DIAGNOSIS — J9611 Chronic respiratory failure with hypoxia: Secondary | ICD-10-CM | POA: Insufficient documentation

## 2018-04-06 DIAGNOSIS — J45991 Cough variant asthma: Secondary | ICD-10-CM | POA: Insufficient documentation

## 2018-04-06 NOTE — Assessment & Plan Note (Signed)
Strongly suspect the acute event/ infiltrates had nothing to do with PE based on the non-occlusive nature of the CTa findings, relatively low "clot burden" suggested by D dimer,  and the assoc GG changes which either represented pulmonary edema in pt with severe osa and diastolic dysfunction (thus the relatively low bnp setting, which can also be falsely low in MO pts)   In any case, doing better now and needs to work on rehab/wt loss and continue DOAC pending f/u by hematology for long range planning as MO is the long range issue, knee surgery the provoking event.

## 2018-04-06 NOTE — Assessment & Plan Note (Signed)
Placed on 02 @ admit 02/20/18  [facility]    02/22/18  = 37  With nl tsh    Clearly this is obesity related, already on cpap so nothing to do different here than work on wt loss and f/u with pfts to sort out obst vs restrictive components

## 2018-04-06 NOTE — Assessment & Plan Note (Addendum)
See CTa 02/20/18 with non-occlusive PEs Echo 02/20/18 : Technically difficult study. 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 Venous Dopplers 02/20/18  Neg bilaterally / tds  Clearly has improved at this point but still 02 dep > see chronic resp failure a/p      Total time devoted to counseling  > 50 % of initial 60 min office visit:  review case with pt/ discussion of options/alternatives/ personally creating written customized instructions  in presence of pt  then going over those specific  Instructions directly with the pt including how to use all of the meds but in particular covering each new medication in detail and the difference between the maintenance= "automatic" meds and the prns using an action plan format for the latter (If this problem/symptom => do that organization reading Left to right).  Please see AVS from this visit for a full list of these instructions which I personally wrote for this pt and  are unique to this visit.    See device teaching which extended face to face time for this visit

## 2018-04-06 NOTE — Assessment & Plan Note (Signed)
Not optimally controlled on present regimen. I reviewed this with the patient and emphasized importance of follow-up with primary care.     

## 2018-04-06 NOTE — Assessment & Plan Note (Signed)
04/05/2018  After extensive coaching inhaler device  effectiveness =    75% > continue symbicort 160 2bid   Adequate control on present rx, reviewed in detail with pt > no change in rx needed  For now/ continue also singulair pending return for full pfts

## 2018-04-07 ENCOUNTER — Encounter: Payer: Self-pay | Admitting: Cardiology

## 2018-04-07 ENCOUNTER — Ambulatory Visit (INDEPENDENT_AMBULATORY_CARE_PROVIDER_SITE_OTHER): Payer: BLUE CROSS/BLUE SHIELD | Admitting: Cardiology

## 2018-04-07 VITALS — BP 144/78 | HR 90 | Ht 63.0 in | Wt 297.0 lb

## 2018-04-07 DIAGNOSIS — J9611 Chronic respiratory failure with hypoxia: Secondary | ICD-10-CM | POA: Diagnosis not present

## 2018-04-07 DIAGNOSIS — R079 Chest pain, unspecified: Secondary | ICD-10-CM | POA: Diagnosis not present

## 2018-04-07 DIAGNOSIS — E119 Type 2 diabetes mellitus without complications: Secondary | ICD-10-CM | POA: Diagnosis not present

## 2018-04-07 DIAGNOSIS — Z6841 Body Mass Index (BMI) 40.0 and over, adult: Secondary | ICD-10-CM | POA: Diagnosis not present

## 2018-04-07 DIAGNOSIS — E782 Mixed hyperlipidemia: Secondary | ICD-10-CM

## 2018-04-07 DIAGNOSIS — J9612 Chronic respiratory failure with hypercapnia: Secondary | ICD-10-CM | POA: Diagnosis not present

## 2018-04-07 DIAGNOSIS — R072 Precordial pain: Secondary | ICD-10-CM

## 2018-04-07 DIAGNOSIS — I10 Essential (primary) hypertension: Secondary | ICD-10-CM

## 2018-04-07 DIAGNOSIS — I5032 Chronic diastolic (congestive) heart failure: Secondary | ICD-10-CM | POA: Diagnosis not present

## 2018-04-07 DIAGNOSIS — Z7189 Other specified counseling: Secondary | ICD-10-CM

## 2018-04-07 LAB — BASIC METABOLIC PANEL
BUN/Creatinine Ratio: 14 (ref 9–23)
BUN: 9 mg/dL (ref 6–24)
CALCIUM: 9.5 mg/dL (ref 8.7–10.2)
CO2: 26 mmol/L (ref 20–29)
CREATININE: 0.66 mg/dL (ref 0.57–1.00)
Chloride: 100 mmol/L (ref 96–106)
GFR calc Af Amer: 118 mL/min/{1.73_m2} (ref >59)
GFR calc non Af Amer: 103 mL/min/{1.73_m2} (ref >59)
GLUCOSE: 106 mg/dL — AB (ref 65–99)
Potassium: 3.5 mmol/L (ref 3.5–5.2)
Sodium: 142 mmol/L (ref 134–144)

## 2018-04-07 LAB — LIPID PANEL
CHOLESTEROL TOTAL: 223 mg/dL — AB (ref 100–199)
Chol/HDL Ratio: 6.2 ratio — ABNORMAL HIGH (ref 0.0–4.4)
HDL: 36 mg/dL — ABNORMAL LOW (ref >39)
LDL CALC: 146 mg/dL — AB (ref 0–99)
Triglycerides: 204 mg/dL — ABNORMAL HIGH (ref 0–149)
VLDL Cholesterol Cal: 41 mg/dL — ABNORMAL HIGH (ref 5–40)

## 2018-04-07 MED ORDER — METOPROLOL TARTRATE 50 MG PO TABS: 50.0000 mg | ORAL_TABLET | Freq: Once | ORAL | 0 refills | Status: DC

## 2018-04-07 NOTE — Progress Notes (Signed)
Cardiology Office Note:    Date:  04/07/2018   ID:  Brandi Howell, DOB January 01, 1966, MRN [id number]  PCP:  Dorothyann Peng, MD  Cardiologist:  Jodelle Red, MD PhD  Referring MD: Dorothyann Peng, MD   Chief Complaint  Patient presents with  . Shortness of Breath  . Chest Pain    History of Present Illness:    Brandi Howell is a 53 y.o. female with a hx of diastolic heart failure who is seen as a new consult at the request of Dr. Allyne Gee for the evaluation and management of shortness of breath.  Brandi Howell was hospitalized in 01/2018 with multiple issues. Had blood clots in the lungs, pneumonia, and heart failure. Was discharged on home oxygen, now uses 1 L when walking around, but doesn't need it at rest. Was started on CPAP for sleep apnea. Pending hematology appointment for thrombophilia. Is followed by pulmonology.  Now has occasional left chest tightness, can last anywhere from seconds to minutes. Not associated with exertion. Eases off with tylenol or zantac but comes back. Hasn't had any in two days, but prior was happening multiples times per day. Nonradiating, occasionally tender to palpation. No lightheadedness, diaphoresis, nausea with it. Not pleuritic.   Her shortness of breath is gradually improving. Has noticed significant decrease in edema. No PND or orthopnea. No syncope.  Working hard on dietary changes, has made major improvements. Wt was 314 entering hospital, was 301 on discharge. Has lost about 5 lbs since discharge. Interested in meeting with nutritionist. Activity severely limited by her breathing.  Family history: gma died at age 32 from MI. That gma's sister had AAA. Father has 2 prior aortic valve surgeries, died at age 18. Brother died at age 9 of an MI. Mother with cholesterol and high blood pressure.  Past Medical History:  Diagnosis Date  . Asthma   . Diabetes mellitus without complication (HCC)   . Goiter   . Gout   .  Hypercholesteremia   . Hypertension   . Vitamin D deficiency     Past Surgical History:  Procedure Laterality Date  . CHOLECYSTECTOMY    . KNEE ARTHROSCOPY    . KNEE ARTHROSCOPY Left 10/25/2017   Procedure: LEFT KNEE ARTHROSCOPY WITH MEDIAL MENISECTOMY, CHONDROPLASTY;  Surgeon: Gean Birchwood, MD;  Location: [facility] OR;  Service: Orthopedics;  Laterality: Left;  . TUBAL LIGATION      Current Medications: Current Outpatient Medications on File Prior to Visit  Medication Sig  . albuterol (PROAIR HFA) 108 (90 Base) MCG/ACT inhaler Inhale 2 puffs into the lungs every 6 (six) hours as needed for wheezing or shortness of breath.  . allopurinol (ZYLOPRIM) 100 MG tablet Take 100 mg by mouth daily.  Marland Kitchen amLODipine-olmesartan (AZOR) 5-40 MG tablet Take 1 tablet by mouth daily.  Marland Kitchen apixaban (ELIQUIS) 5 MG TABS tablet Take 1 tablet (5 mg total) by mouth 2 (two) times daily. Start around 03/24/18 after finishing the starter pack  . budesonide-formoterol (SYMBICORT) 160-4.5 MCG/ACT inhaler Inhale 2 puffs into the lungs 2 (two) times daily.  . cetirizine (ZYRTEC) 10 MG tablet Take 10 mg by mouth daily.  . fluticasone (FLONASE) 50 MCG/ACT nasal spray Place 2 sprays into both nostrils daily.  . furosemide (LASIX) 40 MG tablet Take 1 tablet (40 mg total) by mouth daily.  Marland Kitchen guaiFENesin (MUCINEX) 600 MG 12 hr tablet Take 1 tablet (600 mg total) by mouth 2 (two) times daily.  . montelukast (SINGULAIR) 10 MG tablet Take 10 mg  by mouth daily.  . OXYGEN 2lpm 24/7 [facility]  . Semaglutide (OZEMPIC) 0.25 or 0.5 MG/DOSE SOPN 1 injection every wk  . UNABLE TO FIND Med Name: CPAP with o2 bled in 2lpm   No current facility-administered medications on file prior to visit.      Allergies:   Lisinopril and Statins   Social History   Socioeconomic History  . Marital status: Married    Spouse name: Not on file  . Number of children: Not on file  . Years of education: Not on file  . Highest education level: Not on file    Occupational History  . Not on file  Social Needs  . Financial resource strain: Not on file  . Food insecurity:    Worry: Not on file    Inability: Not on file  . Transportation needs:    Medical: Not on file    Non-medical: Not on file  Tobacco Use  . Smoking status: Never Smoker  . Smokeless tobacco: Never Used  Substance and Sexual Activity  . Alcohol use: Yes    Frequency: Never    Comment: occ  . Drug use: No  . Sexual activity: Not on file  Lifestyle  . Physical activity:    Days per week: Not on file    Minutes per session: Not on file  . Stress: Not on file  Relationships  . Social connections:    Talks on phone: Not on file    Gets together: Not on file    Attends religious service: Not on file    Active member of club or organization: Not on file    Attends meetings of clubs or organizations: Not on file    Relationship status: Not on file  Other Topics Concern  . Not on file  Social History Narrative  . Not on file     Family History: The patient's Family history: gma died at age 51 from MI. That gma's sister had AAA. Father has 2 prior aortic valve surgeries, died at age 51. Brother died at age 51 of an MI. Mother with cholesterol and high blood pressure.  ROS:   Please see the history of present illness.  Additional pertinent ROS: Review of Systems  Constitutional: Positive for weight loss. Negative for chills and fever.  HENT: Negative for hearing loss.   Eyes: Negative for double vision.  Respiratory: Positive for shortness of breath. Negative for cough and stridor.   Cardiovascular: Positive for chest pain. Negative for palpitations, orthopnea, claudication, leg swelling and PND.  Gastrointestinal: Negative for abdominal pain and blood in stool.  Genitourinary: Negative for hematuria.  Musculoskeletal: Positive for joint pain. Negative for falls.  Skin: Negative for rash.  Neurological: Negative for focal weakness and loss of consciousness.   Endo/Heme/Allergies: Does not bruise/bleed easily.   EKGs/Labs/Other Studies Reviewed:    The following studies were reviewed today: Echo from 02/20/18 Study Conclusions  - Procedure narrative: Transthoracic echocardiography. Image   quality was suboptimal. Technically difficult study. - Left ventricle: The cavity size was normal. Wall thickness was   increased in a pattern of moderate LVH. Systolic function was   normal. The estimated ejection fraction was in the range of 60%   to 65%. Wall motion was normal; there were no regional wall   motion abnormalities. Doppler parameters are consistent with   abnormal left ventricular relaxation (grade 1 diastolic   dysfunction). The Ee&' ratio is >15, suggesting elevated LV   filling pressure. - Left  atrium: The atrium was normal in size. - Inferior vena cava: The vessel was dilated. The respirophasic   diameter changes were blunted (< 50%), consistent with elevated   central venous pressure.  Impressions: - Technically difficult study. 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  CTA 02/20/18 FINDINGS: Cardiovascular: Satisfactory opacification of the pulmonary arteries to the segmental level. Small nonocclusive pulmonary emboli within segmental and subsegmental branches of the basilar segments of the left lower lobe. Suspected subsegmental nonocclusive pulmonary emboli in the right lower lobe. No evidence of right heart strain. No pericardial effusion. Grossly normal thoracic aorta.  Mediastinum/Nodes: No enlarged mediastinal, hilar, or axillary lymph nodes. Thyroid gland, trachea, and esophagus demonstrate no significant findings.  Lungs/Pleura: Bilateral patchy areas of airspace consolidation throughout both lungs with upper lobe predominance.  Upper Abdomen: No acute abnormality.  Musculoskeletal: No chest wall abnormality. No acute or significant osseous  findings.  Review of the MIP images confirms the above findings.  IMPRESSION: Scattered nonobstructive pulmonary emboli within segmental and subsegmental branches of the basilar segments of the left lower lobe.  Suspected subsegmental nonocclusive pulmonary emboli in the right lower lobe.  No evidence of right heart strain.  Multifocal bilateral airspace consolidation with upper lobe predominance. In the appropriate clinical setting, this likely represents multifocal pneumonia. Some of the peripheral areas of consolidation may potentially represent pulmonary infarctions, however the degree and distribution of airspace consolidation is out of proportion to the small peripheral pulmonary emboli seen, and therefore infectious etiology is favored.  EKG:  EKG is ordered today.  The ekg ordered today demonstrates normal sinus rhythm, LVH with repol abnormalities.  Recent Labs: 02/19/2018: B Natriuretic Peptide 123.6; TSH 0.498 02/21/2018: Hemoglobin 11.6; Platelets 204 02/22/2018: BUN 10; Creatinine, Ser 0.73; Magnesium 2.2; Potassium 3.0; Sodium 141  Recent Lipid Panel No results found for: CHOL, TRIG, HDL, CHOLHDL, VLDL, LDLCALC, LDLDIRECT  Physical Exam:    VS:  BP (!) 144/78 (BP Location: Left Arm, Patient Position: Sitting, Cuff Size: Large)   Pulse 90   Ht 5\' 3"  (1.6 m)   Wt 297 lb (134.7 kg)   LMP 03/28/2018   BMI 52.61 kg/m     Wt Readings from Last 3 Encounters:  04/07/18 297 lb (134.7 kg)  04/05/18 296 lb (134.3 kg)  02/22/18 (!) 301 lb 6.4 oz (136.7 kg)     GEN: Well nourished, well developed in no acute distress HEENT: NCAT, oxygen nasal cannula in place. NECK: No JVD at 90 degrees; No carotid bruits LYMPHATICS: No lymphadenopathy CARDIAC: regular rhythm, normal S1 and S2, no rubs, gallops. Faint 1/6 early systolic murmur. RESPIRATORY:  Clear to auscultation without rales, wheezing or rhonchi  ABDOMEN: Soft, non-tender, non-distended MUSCULOSKELETAL:   Trace pitting edema in bilateral lower extremities; No deformity  SKIN: Warm and dry NEUROLOGIC:  Alert and oriented x 3 PSYCHIATRIC:  Normal affect   ASSESSMENT:    1. Chest pain, unspecified type   2. Essential hypertension   3. Class 3 severe obesity due to excess calories with serious comorbidity and body mass index (BMI) of 50.0 to 59.9 in adult (HCC)   4. Mixed hyperlipidemia   5. Chronic diastolic heart failure (HCC)   6. Counseling on health promotion and disease prevention   7. Chronic respiratory failure with hypoxia and hypercapnia (HCC)   8. Precordial chest pain   9. Type 2 diabetes mellitus without complication, without long-term current use of insulin (HCC)  PLAN:    1. Chest pain -has atypical features, but she has significant risk factors and has not previously been worked up. Cannot walk on treadmill as she desaturates when she walks. Given her respiratory status, would not try regadenason. She tolerated CT PE in May, last renal function was good. Best option currently is for CT coronary angiography for further evaluation. -will check BMET today for kidney function in anticipation of scan. If it can't be scheduled in the next week, will repeat BMET to make sure it is within a week of the study.  2. Chronic diastolic heart failure -appears nearly euvolemic today. Doing well on furosemide 40 mg. Educated on the cause of diastolic heart failure and the current management approach -controlling risk factors currently is the goal. May benefit from spironolactone in the future.  3. Hypertension -has had several elevated readings recently. It is better today than it was two days ago, but optimal control is ideal given her diastolic dysfunction. She reports being on amlodipine 10 mg in the past with better control. She is currently on amlodipine 5mg -olmesartan 40 mg combo pill. She is on furosemide for a diuretic. Would suggest increasing amlodipine dose to 10 mg.  Alternatively, if her potassium is not elevated could consider spironolactone. If spironolactone started, should have repeat BMET 1 week later to check kidney function and potassium. Would wait to add until after CTA. She wants to have her PCP manage her blood pressure given her frequency of visits, but happy to help if we can be of assistance.  4. Obesity -patient would like to speak to a nutritionist to help her understand appropriate portion sizes and better food choices. She is motivated for weight loss. We will refer her today. -counseled on diet, activity, and weight loss. She is still recovering from her hospitalization in may. If stress test low risk, she might benefit from pulmonary rehab once she is more stable.  5. Hyperlipidemia -checking lipids today -she reports being tried on multiple different statins with myalgia. Most recently reports being on pravastatin, had muscle aches which cease when she stopped statin. Does not think she has tried ezetimibe. Based on risk stratification from stress test, will either start ezetimibe at next visit or if very high risk could consider PCSK-9 inhibitor. Will follow up after results.  6. Chronic respiratory failure with sleep apnea on CPAP and home O2 -on apixaban for DVT/PE, pending evaluation by hematology for duration. She was several months out from knee surgery at the time, and no other clear provoking factor  7. Diabetes, type II -doing well, working on lifestyle -managed with semaglutide. From a CV perspective, this or an SGLT2 inhibitor would potentially be beneficial for her in the long term.  Will follow results of test, then follow up in 3 mos to monitor and discuss next steps.  Medication Adjustments/Labs and Tests Ordered: Current medicines are reviewed at length with the patient today.  Concerns regarding medicines are outlined above.  Orders Placed This Encounter  Procedures  . CT CORONARY MORPH W/CTA COR W/SCORE W/CA W/CM  &/OR WO/CM  . CT CORONARY FRACTIONAL FLOW RESERVE DATA PREP  . CT CORONARY FRACTIONAL FLOW RESERVE FLUID ANALYSIS  . Lipid panel  . Basic metabolic panel  . Basic metabolic panel   Meds ordered this encounter  Medications  . metoprolol tartrate (LOPRESSOR) 50 MG tablet    Sig: Take 1 tablet (50 mg total) by mouth once for 1 dose. Take 1 hour prior to test  Dispense:  1 tablet    Refill:  0    Patient Instructions  Medication Instructions: Your physician recommends that you continue on your current medications as directed.    If you need a refill on your cardiac medications before your next appointment, please call your pharmacy.   Labwork: Your physician recommends that you return for lab work in: Today (lipid, BMP)  Your physician recommends that you return for lab work in: 1 week prior to test (BMP)    Procedures/Testing: Your physician has requested that you have cardiac CT. Cardiac computed tomography (CT) is a painless test that uses an x-ray machine to take clear, detailed pictures of your heart. For further information please visit [email or web address] Please follow instruction sheet as given.     Follow-Up: Your physician wants you to follow-up in 3 months with Dr. Cristal Deer  Special Instructions: Referral to Nutrition- someone will be contacting you    Thank you for choosing Heartcare at [facility]!!    Please arrive at the [facility] main entrance of [facility] at   (30-45 minutes prior to test start time)  [facility] [address] [place], [place] [address] [phone]  Proceed to the [facility] Radiology Department (First Floor).  Please follow these instructions carefully (unless otherwise directed):    On the Night Before the Test: . Drink plenty of water. . Do not consume any caffeinated/decaffeinated beverages or chocolate 12 hours prior to your test. . Do not take any antihistamines 12 hours prior to your  test.   On the Day of the Test: . Drink plenty of water. Do not drink any water within one hour of the test. . Do not eat any food 4 hours prior to the test. . You may take your regular medications prior to the test. . IF NOT ON A BETA BLOCKER - Take 50 mg of lopressor (metoprolol) one hour before the test. . HOLD Furosemide morning of the test.  After the Test: . Drink plenty of water. . After receiving IV contrast, you may experience a mild flushed feeling. This is normal. . On occasion, you may experience a mild rash up to 24 hours after the test. This is not dangerous. If this occurs, you can take Benadryl 25 mg and increase your fluid intake. . If you experience trouble breathing, this can be serious. If it is severe call 911 IMMEDIATELY. If it is mild, please call our office. . If you take any of these medications: Glipizide/Metformin, Avandament, Glucavance, please do not take 48 hours after completing test.       Signed, Jodelle Red, MD PhD 04/07/2018 12:38 PM    [facility]

## 2018-04-07 NOTE — Patient Instructions (Signed)
Medication Instructions: Your physician recommends that you continue on your current medications as directed.    If you need a refill on your cardiac medications before your next appointment, please call your pharmacy.   Labwork: Your physician recommends that you return for lab work in: Today (lipid, BMP)  Your physician recommends that you return for lab work in: 1 week prior to test (BMP)    Procedures/Testing: Your physician has requested that you have cardiac CT. Cardiac computed tomography (CT) is a painless test that uses an x-ray machine to take clear, detailed pictures of your heart. For further information please visit [email or web address]. Please follow instruction sheet as given.     Follow-Up: Your physician wants you to follow-up in 3 months with Dr. Cristal Deerhristopher  Special Instructions: Referral to Nutrition- someone will be contacting you    Thank you for choosing [facility] at [facility]!!    Please [facility] the [facility] main entrance of [facility] at   (30-45 minutes prior to test start time)  [facility] [address] [place], [place] [address] [phone]  Proceed to the [facility] Radiology Department (First Floor).  Please follow these instructions carefully (unless otherwise directed):    On the Night Before the Test: . Drink plenty of water. . Do not consume any caffeinated/decaffeinated beverages or chocolate 12 hours prior to your test. . Do not take any antihistamines 12 hours prior to your test.   On the Day of the Test: . Drink plenty of water. Do not drink any water within one hour of the test. . Do not eat any food 4 hours prior to the test. . You may take your regular medications prior to the test. . IF NOT ON A BETA BLOCKER - Take 50 mg of lopressor (metoprolol) one hour before the test. . HOLD Furosemide morning of the test.  After the Test: . Drink plenty of water. . After receiving IV contrast, you  may experience a mild flushed feeling. This is normal. . On occasion, you may experience a mild rash up to 24 hours after the test. This is not dangerous. If this occurs, you can take Benadryl 25 mg and increase your fluid intake. . If you experience trouble breathing, this can be serious. If it is severe call 911 IMMEDIATELY. If it is mild, please call our office. . If you take any of these medications: Glipizide/Metformin, Avandament, Glucavance, please do not take 48 hours after completing test.

## 2018-04-08 ENCOUNTER — Telehealth: Payer: Self-pay

## 2018-04-08 DIAGNOSIS — Z79899 Other long term (current) drug therapy: Secondary | ICD-10-CM

## 2018-04-08 DIAGNOSIS — E782 Mixed hyperlipidemia: Secondary | ICD-10-CM

## 2018-04-08 NOTE — Telephone Encounter (Signed)
-----   Message from Jodelle RedBridgette Christopher, MD sent at 04/07/2018  5:40 PM EDT ----- Kidney function is normal, but cholesterol and triglycerides are very high. I know she hasn't done well with statins in the past, but based on these numbers we will definitely need to discuss options for cholesterol management at the next visit. Would also like to get a fasting lipid panel at the next visit, so if she wants to schedule a morning appointment for her follow up in 3 mos she can come in fasting. Thanks!

## 2018-04-08 NOTE — Addendum Note (Signed)
Addended by: Dorris FetchANDERSON, Eberardo Demello on: 04/08/2018 10:41 AM   Modules accepted: Orders

## 2018-04-08 NOTE — Telephone Encounter (Signed)
Left message for pt to call back in regards to lab results. Lab orders placed and mailed to pt.

## 2018-04-08 NOTE — Telephone Encounter (Signed)
Follow up: ° ° °Patient returning call for lab results. °

## 2018-04-08 NOTE — Telephone Encounter (Signed)
Spoke with pt and advised of labs result along with Dr. Di Kindlehristopher's recommendation. Pt verbalized understanding.

## 2018-04-09 DIAGNOSIS — G4733 Obstructive sleep apnea (adult) (pediatric): Secondary | ICD-10-CM | POA: Diagnosis not present

## 2018-04-11 NOTE — Telephone Encounter (Signed)
Opened in error

## 2018-04-13 ENCOUNTER — Telehealth: Payer: Self-pay

## 2018-04-13 DIAGNOSIS — I110 Hypertensive heart disease with heart failure: Secondary | ICD-10-CM | POA: Diagnosis not present

## 2018-04-13 DIAGNOSIS — I5031 Acute diastolic (congestive) heart failure: Secondary | ICD-10-CM | POA: Diagnosis not present

## 2018-04-13 DIAGNOSIS — E1165 Type 2 diabetes mellitus with hyperglycemia: Secondary | ICD-10-CM | POA: Diagnosis not present

## 2018-04-13 DIAGNOSIS — Z79899 Other long term (current) drug therapy: Secondary | ICD-10-CM | POA: Diagnosis not present

## 2018-04-13 NOTE — Telephone Encounter (Signed)
Trying to schedule initial care guide visit. No answer. Will call back

## 2018-04-24 DIAGNOSIS — I5030 Unspecified diastolic (congestive) heart failure: Secondary | ICD-10-CM | POA: Diagnosis not present

## 2018-04-27 DIAGNOSIS — I5030 Unspecified diastolic (congestive) heart failure: Secondary | ICD-10-CM | POA: Diagnosis not present

## 2018-05-10 DIAGNOSIS — G4733 Obstructive sleep apnea (adult) (pediatric): Secondary | ICD-10-CM | POA: Diagnosis not present

## 2018-05-11 ENCOUNTER — Other Ambulatory Visit: Payer: Self-pay

## 2018-05-11 DIAGNOSIS — E119 Type 2 diabetes mellitus without complications: Secondary | ICD-10-CM

## 2018-05-11 DIAGNOSIS — N920 Excessive and frequent menstruation with regular cycle: Secondary | ICD-10-CM | POA: Diagnosis not present

## 2018-05-13 ENCOUNTER — Encounter: Payer: Self-pay | Admitting: Cardiology

## 2018-05-16 DIAGNOSIS — E1165 Type 2 diabetes mellitus with hyperglycemia: Secondary | ICD-10-CM | POA: Diagnosis not present

## 2018-05-16 DIAGNOSIS — Z79899 Other long term (current) drug therapy: Secondary | ICD-10-CM | POA: Diagnosis not present

## 2018-05-16 DIAGNOSIS — I119 Hypertensive heart disease without heart failure: Secondary | ICD-10-CM | POA: Diagnosis not present

## 2018-05-16 DIAGNOSIS — I5030 Unspecified diastolic (congestive) heart failure: Secondary | ICD-10-CM | POA: Diagnosis not present

## 2018-05-16 DIAGNOSIS — I110 Hypertensive heart disease with heart failure: Secondary | ICD-10-CM | POA: Diagnosis not present

## 2018-05-17 ENCOUNTER — Telehealth: Payer: Self-pay

## 2018-05-17 NOTE — Telephone Encounter (Signed)
Attempted to contact pt and inform that per scheduling, they have been trying to call pt to schedule Coronary CTA. Left message to call back.

## 2018-05-20 ENCOUNTER — Ambulatory Visit (INDEPENDENT_AMBULATORY_CARE_PROVIDER_SITE_OTHER): Payer: BLUE CROSS/BLUE SHIELD | Admitting: Internal Medicine

## 2018-05-20 ENCOUNTER — Encounter: Payer: Self-pay | Admitting: Internal Medicine

## 2018-05-20 VITALS — BP 128/82 | HR 97 | Ht 63.0 in | Wt 290.0 lb

## 2018-05-20 DIAGNOSIS — J45991 Cough variant asthma: Secondary | ICD-10-CM | POA: Diagnosis not present

## 2018-05-20 DIAGNOSIS — R918 Other nonspecific abnormal finding of lung field: Secondary | ICD-10-CM

## 2018-05-20 LAB — PULMONARY FUNCTION TEST
DL/VA % PRED: 148 %
DL/VA: 6.95 ml/min/mmHg/L
DLCO UNC: 18.48 ml/min/mmHg
DLCO unc % pred: 80 %
FEF 25-75 POST: 1.23 L/s
FEF 25-75 Pre: 1.76 L/sec
FEF2575-%Change-Post: -29 %
FEF2575-%PRED-PRE: 75 %
FEF2575-%Pred-Post: 52 %
FEV1-%Change-Post: -1 %
FEV1-%PRED-POST: 65 %
FEV1-%PRED-PRE: 66 %
FEV1-PRE: 1.46 L
FEV1-Post: 1.44 L
FEV1FVC-%Change-Post: -1 %
FEV1FVC-%Pred-Pre: 102 %
FEV6-%CHANGE-POST: 0 %
FEV6-%PRED-PRE: 64 %
FEV6-%Pred-Post: 65 %
FEV6-PRE: 1.73 L
FEV6-Post: 1.75 L
FEV6FVC-%PRED-POST: 103 %
FEV6FVC-%PRED-PRE: 103 %
FVC-%Change-Post: 0 %
FVC-%Pred-Post: 63 %
FVC-%Pred-Pre: 63 %
FVC-Post: 1.75 L
FVC-Pre: 1.75 L
POST FEV6/FVC RATIO: 100 %
PRE FEV1/FVC RATIO: 83 %
Post FEV1/FVC ratio: 83 %
Pre FEV6/FVC Ratio: 100 %
RV % pred: 88 %
RV: 1.56 L
TLC % PRED: 66 %
TLC: 3.25 L

## 2018-05-20 NOTE — Progress Notes (Signed)
PFT completed today 05/20/18.  

## 2018-05-20 NOTE — Patient Instructions (Signed)
Change symbicort to where you take up to 2 pff every 12 hours if you think it's helping    Only use your albuterol (proair) as a rescue medication to be used if you can't catch your breath by resting or doing a relaxed purse lip breathing pattern.  - The less you use it, the better it will work when you need it. - Ok to use up to 2 puffs  every 4 hours if you must but call for immediate appointment if use goes up over your usual need - Don't leave home without it !!  (think of it like the spare tire for your car)     If you are satisfied with your treatment plan,  let your doctor know and he/she can either refill your medications or you can return here when your prescription runs out.     If in any way you are not 100% satisfied,  please tell us.  If 100% better, tell your friends!  Pulmonary follow up is as needed

## 2018-05-20 NOTE — Progress Notes (Signed)
Brandi Howell, female    DOB: 1965-10-29,    MRN: [id number]   Brief patient profile:  [age] yobf  never smoker onset of rhinitis in her 20's then trouble breathing in her mid 4940s  Singulair/rn albuterol then L knee surgery Jan 2019 some swelling L> R knee swelling never really regained nl activity tol  then abrupt dry cough Brandi Howell/worse sob/ center cp > UC > ER 02/10/18 with dx of suspected PE both lower lobes plus ? Edema vs pna by CT referred to pulmonary clinic 04/05/2018 by Dr   Brandi Howell to sort out why still sob/ 02 dep on rx for PE since 02/20/18   History of Present Illness  04/05/2018 Initial office eval  Brandi Howell re doe  Chief Complaint  Patient presents with  . Pulmonary Consult    Referred by Dr. Dorothyann Pengobyn Howell.  Pt states had PNA and PE in May 2019.  She states she is doing well as far as her breathing goes.    Dyspnea:  .MMRC2 = can't walk a nl pace on a flat grade s sob but does fine slow and flat on 1lpm sats ok  Cough: gone ow Sleep: on cpap sleeping fine 2lpm  SABA use: maybe once a day rec No change medications for now      05/20/2018  f/u ov/Brandi Howell re: restrictive pfts s/p pe  - on singulair/ doesn't think symbicort helping and only using 25% effectively (see cough variant asthma a/p) - no longer using 02 x at hs  Chief Complaint  Patient presents with  . Follow-up    Pt here after PFT. Pt states she feels her DOE has improved. Pt states she does not need her O2 as much and does not get as SOB with activities - pt states she only becomes SOB when doing heavy activity (stairs or hills). Pt states she is using her albuterol twice a week but it is more prophlactic - i.e. pt using it before grocery shopping  Dyspnea:  Slowly improving [facility] can't walk a nl pace on a flat grade s sob but does fine slow and flat s 02 Cough: none Sleeping: cpap and 02 per dohmeir/2 pillows SABA use: none/ just symbicort  02: only hs   No obvious day to day or daytime variability or assoc  excess/ purulent sputum or mucus plugs or hemoptysis or cp or chest tightness, subjective wheeze or overt sinus or hb symptoms.   Sleeping ok as above  without nocturnal  or early am exacerbation  of respiratory  c/o's or need for noct saba. Also denies any obvious fluctuation of symptoms with weather or environmental changes or other aggravating or alleviating factors except as outlined above   No unusual exposure hx or h/o childhood pna/ asthma or knowledge of premature birth.  Current Allergies, Complete Past Medical History, Past Surgical History, Family History, and Social History were reviewed in Brandi Howell [facility] electronic medical record.  ROS  The following are not active complaints unless bolded Hoarseness, sore throat, dysphagia, dental problems, itching, sneezing,  nasal congestion or discharge of excess mucus or purulent secretions, ear ache,   fever, chills, sweats, unintended wt loss or wt gain, classically pleuritic or exertional cp,  orthopnea pnd or arm/hand swelling  or leg swelling, presyncope, palpitations, abdominal pain, anorexia, nausea, vomiting, diarrhea  or change in bowel habits or change in bladder habits, change in stools or change in urine, dysuria, hematuria,  rash, arthralgias, visual complaints, headache, numbness,  weakness or ataxia or problems with walking or coordination,  change in mood or  memory.        Current Meds  Medication Sig  . albuterol (PROAIR HFA) 108 (90 Base) MCG/ACT inhaler Inhale 2 puffs into the lungs every 6 (six) hours as needed for wheezing or shortness of breath.  . allopurinol (ZYLOPRIM) 100 MG tablet Take 100 mg by mouth daily.  Marland Kitchen amLODipine-olmesartan (AZOR) 5-40 MG tablet Take 1 tablet by mouth daily.  Marland Kitchen apixaban (ELIQUIS) 5 MG TABS tablet Take 1 tablet (5 mg total) by mouth 2 (two) times daily. Start around 03/24/18 after finishing the starter pack  . budesonide-formoterol (SYMBICORT) 160-4.5 MCG/ACT inhaler Inhale 2 puffs into the lungs  2 (two) times daily.  . cetirizine (ZYRTEC) 10 MG tablet Take 10 mg by mouth daily.  . fluticasone (FLONASE) 50 MCG/ACT nasal spray Place 2 sprays into both nostrils daily.  . furosemide (LASIX) 40 MG tablet Take 1 tablet (40 mg total) by mouth daily.  Marland Kitchen guaiFENesin (MUCINEX) 600 MG 12 hr tablet Take 1 tablet (600 mg total) by mouth 2 (two) times daily.  . montelukast (SINGULAIR) 10 MG tablet Take 10 mg by mouth daily.  . OXYGEN 2lpm 24/7 AHC  . Semaglutide (OZEMPIC) 0.25 or 0.5 MG/DOSE SOPN 1 injection every wk  . UNABLE TO FIND Med Name: CPAP with o2 bled in 2lpm                 Objective:    amb obese bf nad   05/20/2018      290  04/07/18 297 lb (134.7 kg)  04/05/18 296 lb (134.3 kg)  02/22/18 (!) 301 lb 6.4 oz (136.7 kg)     Vital signs reviewed - Note on arrival 02 sats  94% on RA         HEENT: nl dentition, turbinates bilaterally, and oropharynx. Nl external ear canals without cough reflex - Modified Mallampati Score =   3-4   NECK :  without JVD/Nodes/TM/ nl carotid upstrokes bilaterally   LUNGS: no acc muscle use,  Nl contour chest which is clear to A and P bilaterally without cough on insp or exp maneuvers   CV:  RRR  no s3 or murmur or increase in P2, and no edema   ABD: obese/  nontender with limited inspiratory excursion in the supine position. No bruits or organomegaly appreciated, bowel sounds nl  MS:  Nl gait/ ext warm without deformities, calf tenderness, cyanosis or clubbing No obvious joint restrictions   SKIN: warm and dry without lesions    NEURO:  alert, approp, nl sensorium with  no motor or cerebellar deficits apparent.                Assessment

## 2018-05-22 ENCOUNTER — Encounter: Payer: Self-pay | Admitting: Internal Medicine

## 2018-05-22 NOTE — Assessment & Plan Note (Addendum)
CTa reviewed again today with pt:  No evidence of residual changes on cxr/ nl dlco so no additional w/u needed and defer duration of DOAC to PCP unless otherwise requested as until she loses wt she will be at increased risk of recurrence and note the venous dopplers were TDS  Due to wt which makes them less useful to screen for dvt so I would rec indefinite rx with the low dose Eliquis p 6 m of regular dosing   Pulmonary f/u can be prn as OSA f/u is per Neurology

## 2018-05-22 NOTE — Assessment & Plan Note (Signed)
04/05/2018  After extensive coaching inhaler device  effectiveness =    75% > continue symbicort 160 2bid  - PFT's  05/20/2018  FEV1 1.46 (66 % ) ratio 83  p no % improvement from saba p symb 160 x 2 prior to study with DLCO  80 % corrects to 148  % for alv volume   - 05/20/2018  After extensive coaching inhaler device,  effectiveness =    25%   Based on how poorly she is using symbicort even p teaching, if she has asthma at all at this point it is mild and intermittent as her lung function is perfect  Based on two studies from NEJM  378; 20 p 1865 (2018) and 380 : p2020-30 (2019) in pts with mild asthma it is reasonable to use low dose symbicort eg 80 2bid "prn" flare in this setting but I emphasized this was only shown with symbicort and takes advantage of the rapid onset of action but is not the same as "rescue therapy" but can be stopped once the acute symptoms have resolved and the need for rescue has been minimized (< 2 x weekly)    So rec she change to prn symbicort   I had an extended discussion with the patient reviewing all relevant studies completed to date and  lasting 15 to 20 minutes of a 25 minute visit    See device teaching which extended face to face time for this visit.  Each maintenance medication was reviewed in detail including emphasizing most importantly the difference between maintenance and prns and under what circumstances the prns are to be triggered using an action plan format that is not reflected in the computer generated alphabetically organized AVS which I have not found useful in most complex patients, especially with respiratory illnesses  Please see AVS for specific instructions unique to this visit that I personally wrote and verbalized to the the pt in detail and then reviewed with pt  by my nurse highlighting any  changes in therapy recommended at today's visit to their plan of care.

## 2018-05-22 NOTE — Assessment & Plan Note (Signed)
Body mass index is 51.37 kg/m.  -  trending down, encouraged  Lab Results  Component Value Date   TSH 0.498 02/19/2018     Contributing to dvt/pe/risk/osa/ doe/reviewed the need and the process to achieve and maintain neg calorie balance > defer f/u primary care including intermittently monitoring thyroid status

## 2018-05-25 DIAGNOSIS — I5030 Unspecified diastolic (congestive) heart failure: Secondary | ICD-10-CM | POA: Diagnosis not present

## 2018-05-28 DIAGNOSIS — I5030 Unspecified diastolic (congestive) heart failure: Secondary | ICD-10-CM | POA: Diagnosis not present

## 2018-05-31 ENCOUNTER — Telehealth: Payer: Self-pay

## 2018-05-31 NOTE — Telephone Encounter (Signed)
Called to schedule visit. No answer, will call back

## 2018-06-01 ENCOUNTER — Encounter: Payer: Self-pay | Admitting: Neurology

## 2018-06-01 ENCOUNTER — Encounter: Payer: BLUE CROSS/BLUE SHIELD | Attending: Internal Medicine | Admitting: *Deleted

## 2018-06-01 DIAGNOSIS — Z713 Dietary counseling and surveillance: Secondary | ICD-10-CM | POA: Diagnosis not present

## 2018-06-01 DIAGNOSIS — E119 Type 2 diabetes mellitus without complications: Secondary | ICD-10-CM | POA: Insufficient documentation

## 2018-06-01 NOTE — Patient Instructions (Signed)
Plan:  Aim for 3 Carb Choices per meal (45 grams) +/- 1 either way  Aim for 0-2 Carbs per snack if hungry  Include protein in moderation with your meals and snacks Consider reading food labels for Total Carbohydrate and Sodium content of foods Continue your activity level by walking or going to gym for 30 minutes 3 days a week or as tolerated Consider checking BG at alternate times per day a few days a week Continue taking medication as directed by MD

## 2018-06-02 NOTE — Progress Notes (Signed)
Diabetes Self-Management Education  Visit Type: First/Initial  Appt. Start Time: 1015 Appt. End Time: 1115  06/02/2018  [name], identified by name and date of birth, is a 52 y.o. female with a diagnosis of Diabetes: Type 2. 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 She states she was recently admitted to hospital for several problems including CHF, blood clots in her lungs and very high BG. She is now trying to take better care of herself, has reduced her sodium intake and is walking or using elyptical machine at the gym several days a week. She now has a C-PAP machine and is sleeping much better and states she has lost 14 pounds since May. She would like to understand her food choices better for her diabetes control   ASSESSMENT  There were no vitals taken for this visit. There is no height or weight on file to calculate BMI.  Diabetes Self-Management Education - 06/01/18 1019      Visit Information   Visit Type  First/Initial      Initial Visit   Diabetes Type  Type 2    Are you currently following a meal plan?  Yes    What type of meal plan do you follow?  Low sodium, low carb    Are you taking your medications as prescribed?  Yes    Date Diagnosed  2012      Health Coping   How would you rate your overall health?  Fair      Psychosocial Assessment   Patient Belief/Attitude about Diabetes  Motivated to manage diabetes    Self-care barriers  None    Other persons present  Patient    Patient Concerns  Nutrition/Meal planning;Glycemic Control;Weight Control    Special Needs  None    Preferred Learning Style  No preference indicated    Learning Readiness  Change in progress    How often do you need to have someone help you when you read instructions, pamphlets, or other written materials from your doctor or pharmacy?  1 - Never    What is the last grade level you completed in school?  4 year degree      Pre-Education  Assessment   Patient understands the diabetes disease and treatment process.  Needs Instruction    Patient understands incorporating nutritional management into lifestyle.  Needs Instruction    Patient undertands incorporating physical activity into lifestyle.  Needs Instruction    Patient understands using medications safely.  Needs Instruction    Patient understands monitoring blood glucose, interpreting and using results  Needs Instruction    Patient understands prevention, detection, and treatment of acute complications.  Needs Instruction    Patient understands prevention, detection, and treatment of chronic complications.  Needs Instruction    Patient understands how to develop strategies to address psychosocial issues.  Needs Instruction    Patient understands how to develop strategies to promote health/change behavior.  Needs Instruction      Complications   Last HgB A1C per patient/outside source  7 %    How often do you check your blood sugar?  3-4 times / week    Fasting Blood glucose range (mg/dL)  16-109    Postprandial Blood glucose range (mg/dL)  60-454    Number of hypoglycemic episodes per month  0    Have you had a dilated eye exam in the past 12 months?  Yes  Have you had a dental exam in the past 12 months?  No    Are you checking your feet?  Yes    How many days per week are you checking your feet?  5      Dietary Intake   Breakfast  boiled egg 1-2 and 1 fruit OR flavored oatmeal OR eggs, [place] bacon and grits on weekends. (no more biscuits and pancakes)    Lunch  may go home and eat left overs OR salad with meat    Snack (afternoon)  same as evening snack    Dinner  chicken or [place] with vegetables made in Instant Pot, mini potatoes OR salmon or [place] burgers    Snack (evening)  fresh fruit OR graham crackers OR cashews OR popcorners OR white cheddar popcorn    Beverage(s)  water OR 15 calorie lemonade      Exercise   Exercise Type  Light (walking / raking  leaves)    How many days per week to you exercise?  3    How many minutes per day do you exercise?  30    Total minutes per week of exercise  90      Patient Education   Previous Diabetes Education  No    Disease state   Definition of diabetes, type 1 and 2, and the diagnosis of diabetes;Factors that contribute to the development of diabetes    Nutrition management   Role of diet in the treatment of diabetes and the relationship between the three main macronutrients and blood glucose level;Food label reading, portion sizes and measuring food.;Carbohydrate counting;Reviewed blood glucose goals for pre and post meals and how to evaluate the patients' food intake on their blood glucose level.    Physical activity and exercise   Role of exercise on diabetes management, blood pressure control and cardiac health.    Medications  Reviewed patients medication for diabetes, action, purpose, timing of dose and side effects.    Monitoring  Purpose and frequency of SMBG.;Identified appropriate SMBG and/or A1C goals.    Psychosocial adjustment  Role of stress on diabetes      Individualized Goals (developed by patient)   Nutrition  Follow meal plan discussed    Physical Activity  Exercise 3-5 times per week    Medications  take my medication as prescribed    Monitoring   test blood glucose pre and post meals as discussed      Post-Education Assessment   Patient understands the diabetes disease and treatment process.  Demonstrates understanding / competency    Patient understands incorporating nutritional management into lifestyle.  Demonstrates understanding / competency    Patient undertands incorporating physical activity into lifestyle.  Demonstrates understanding / competency    Patient understands using medications safely.  Demonstrates understanding / competency    Patient understands monitoring blood glucose, interpreting and using results  Demonstrates understanding / competency    Patient  understands prevention, detection, and treatment of acute complications.  Demonstrates understanding / competency    Patient understands prevention, detection, and treatment of chronic complications.  Demonstrates understanding / competency    Patient understands how to develop strategies to address psychosocial issues.  Demonstrates understanding / competency    Patient understands how to develop strategies to promote health/change behavior.  Demonstrates understanding / competency      Outcomes   Expected Outcomes  Demonstrated interest in learning. Expect positive outcomes    Future DMSE  PRN    Program Status  Completed       Individualized Plan for Diabetes Self-Management Training:   Learning Objective:  Patient will have a greater understanding of diabetes self-management. Patient education plan is to attend individual and/or group sessions per assessed needs and concerns.   Plan:   Patient Instructions  Plan:  Aim for 3 Carb Choices per meal (45 grams) +/- 1 either way  Aim for 0-2 Carbs per snack if hungry  Include protein in moderation with your meals and snacks Consider reading food labels for Total Carbohydrate and Sodium content of foods Continue your activity level by walking or going to gym for 30 minutes 3 days a week or as tolerated Consider checking BG at alternate times per day a few days a week Continue taking medication as directed by MD  Expected Outcomes:  Demonstrated interest in learning. Expect positive outcomes  Education material provided: ADA Diabetes: Your Take Control Guide, Food label handouts, A1C conversion sheet, Meal plan card and Carbohydrate counting sheet, Menu Planner sheet  If problems or questions, patient to contact team via:  Phone  Future DSME appointment: PRN

## 2018-06-07 ENCOUNTER — Ambulatory Visit (INDEPENDENT_AMBULATORY_CARE_PROVIDER_SITE_OTHER): Payer: BLUE CROSS/BLUE SHIELD | Admitting: Neurology

## 2018-06-07 ENCOUNTER — Encounter: Payer: Self-pay | Admitting: Neurology

## 2018-06-07 VITALS — BP 158/92 | HR 87 | Ht 63.0 in | Wt 287.0 lb

## 2018-06-07 DIAGNOSIS — E669 Obesity, unspecified: Secondary | ICD-10-CM

## 2018-06-07 DIAGNOSIS — G4733 Obstructive sleep apnea (adult) (pediatric): Secondary | ICD-10-CM | POA: Diagnosis not present

## 2018-06-07 DIAGNOSIS — E662 Morbid (severe) obesity with alveolar hypoventilation: Secondary | ICD-10-CM | POA: Diagnosis not present

## 2018-06-07 DIAGNOSIS — Z9989 Dependence on other enabling machines and devices: Secondary | ICD-10-CM

## 2018-06-07 NOTE — Patient Instructions (Signed)
Obesity Hypoventilation Syndrome Obesity hypoventilation syndrome (OHS) means that you are not breathing well enough to get air in and out of your lungs efficiently (ventilation). This causes a low oxygen level and a high carbon dioxide level in your blood (hypoventilation). Having too much total body fat (obesity) is a significant risk factor for developing OHS. OHS makes it harder for your heart to pump oxygen-rich blood to your body. It can cause sleep disturbances and make you feel sleepy during the day. Over time, OHS can increase your risk for:  Heart disease.  High blood pressure (hypertension).  Reduced ability to absorb sugar from the bloodstream (insulin resistance).  Heart failure. Over time, OHS weakens your heart and can lead to heart failure.  What are the causes? The exact cause of OHS is not known. Possible causes include:  Pressure on the lungs from excess body weight.  Obesity-related changes in how much air the lungs can hold (lung capacity) and how much they can expand (lung compliance).  Failure of the brain to regulate oxygen and carbon dioxide levels properly.  Chemicals (hormones) produced by excess fat cells interfering with breathing regulation.  A breathing condition in which breathing pauses or becomes shallow during sleep (sleep apnea). This condition can eventually cause the body to ventilate poorly and to hold onto carbon dioxide during the day.  What increases the risk? You may have a greater risk for OHS if you:  Have a BMI of 30 or higher. BMI is an estimate of body fat that is calculated from height and weight. For adults, a BMI of 30 or higher is considered obese.  Are 40?51 years old.  Carry most of your excess weight around your waist.  Experience moderate symptoms of sleep apnea.  What are the signs or symptoms? The most common symptoms of OHS are:  Daytime sleepiness.  Lack of energy.  Shortness of breath.  Morning  headaches.  Sleep apnea.  Trouble concentrating.  Irritability, mood swings, or depression.  Swollen veins in the neck.  Swelling of the legs.  How is this diagnosed? Your health care provider may suspect OHS if you are obese and have poor breathing during the day and at night. Your health care provider will also do a physical exam. You may have tests to:  Measure your BMI.  Measure your blood oxygen level with a sensor placed on your finger (pulse oximetry).  Measure blood oxygen and carbon dioxide in a blood sample.  Measure the amount of red blood cells in a blood sample. OHS causes the number of red blood cells you have to increase (polycythemia).  Check your breathing ability (pulmonary function testing).  Check your breathing ability, breathing patterns, and oxygen level while you sleep (sleep study).  You may also have a chest X-ray to rule out other breathing problems. You may have an electrocardiogram (ECG) and or echocardiogram to check for signs of heart failure. How is this treated? Weight loss is the most important part of treatment for OHS, and it may be the only treatment that you need. Other treatments may include:  Using a device to open your airway while you sleep, such as a continuous positive airway pressure (CPAP) machine that delivers oxygen to your airway through a mask.  Surgery (gastric bypass surgery) to lower your BMI. This may be needed if: ? You are very obese. ? Other treatments have not worked for you. ? Your OHS is very severe and is causing organ damage, such as   heart failure.  Follow these instructions at home: Medicines  Take over-the-counter and prescription medicines only as told by your health care provider.  Ask your health care provider what medicines are safe for you. You may be told to avoid medicines that can impair breathing and make OHS worse, such as sedatives and narcotics. Sleeping habits  If you are prescribed a CPAP  machine, make sure you understand and use the machine as directed.  Try to get 8 hours of sleep every night.  Go to bed at the same time every night, and get up at the same time every day. General instructions  Work with your health care provider to make a diet and exercise plan that helps you reach and maintain a healthy weight.  Eat a healthy diet.  Avoid smoking.  Exercise regularly as told by your health care provider.  During the evening, do not drink caffeine and do not eat heavy meals.  Keep all follow-up visits as told by your health care provider. This is important. Contact a health care provider if:   You experience new or worsening shortness of breath.  You have chest pain.  You have an irregular heartbeat (palpitations).  You have dizziness.  You faint.  You develop a cough.  You have a fever.  You have chest pain when you breathe (pleurisy). This information is not intended to replace advice given to you by your health care provider. Make sure you discuss any questions you have with your health care provider. Document Released: 02/24/2016 Document Revised: 04/03/2016 Document Reviewed: 02/24/2016 [employer] Interactive Patient Education  2018 [employer]  

## 2018-06-07 NOTE — Progress Notes (Signed)
SLEEP MEDICINE CLINIC   Provider:  Melvyn Novas, M D  Primary Care Physician:  Dorothyann Peng, MD   Referring Provider: Dorothyann Peng, MD    Chief Complaint  Patient presents with  . Follow-up    pt alone, rm 11. pt here for initial cpap visit. DME [employer]. pt states that she is happy with the CPAP machine and cant sleep without it. pt was in hospital in may for blood clots in lungs, PNA and early onset CHF, states things are resolved and much better now    HPI:  Brandi Howell is a 52 y.o. female whose seen here in a revisit from Dr. Allyne Gee , follow up on sleep apnea on 06-07-2018,  I have the pleasure of seeing Brandi Howell today, a 52 year old female patient of Dr. Murtis Sink who underwent a sleep consultation in February of this year followed by a split-night polysomnography on 07 November 2017.  The split-night study documented a very severe form of apnea with an AHI of 147.5.  She spent all night of the sleep study in nonsupine sleep she could not enter deep sleep stages N3 or rapid eye movement sleep and sleep efficiency was only 33%.  It was very difficult for the patient to go to sleep and maintain sleep.  CPAP was finally initiated at 5 cm water and she tried several types of masks before she finally settled on a full face, this was advanced to 7 cm water and allowed for reduction of her apnea index by over 85% however it was not a complete titration study.  Mrs. Liskey agreed to return on 25 Feb 2018 this way for a whole night titration.  We were now able to titrate CPAP to 15 cmH2O, but she did remarkably well at 11 cmH2O pressure.  Oxygen was also applied at 2 L a minute which made it much easier for her to stay asleep.  I prescribed a CPAP auto titration capable device that was set to 11 cmH2O with 2 cm EPR and additional 2 L of oxygen.  I am today able to review the first download.   The patient was 100% compliant CPAP user for the time prior to June 01, 2018  average use of time is 8 hours 60 minutes each night she is using CPAP with the above named settings of 11 cmH2O and 2 cm EPR and a residual AHI of 0.8/h she has very minimal air leaks only some nights.    She endorsed now the Epworth sleepiness score at 3 points, she is no longer excessively fatigued, she feels that she sleeps for most of the night that she uses CPAP which would make 8 hours or more. She hated the FFM and switches to pillows with good success. She no longer sleeps without, has no sleep attacks at work, more energy and she is losing weight ( 27 pounds since June !). Her mood has improved.  Her headaches are now rare !!! Her hemoglobin A1c is improved to 7.    Consult from 10-2017 , CD Dr. Allyne Gee referred this 52 year old African-American right-handed lady for asleep evaluation today on 07/21/2017. Brandi Howell has diabetes, morbid obesity, and is not perimenopausal yet. She carries also a diagnosis of essential hypertension, a hypercholesterolemia, and asthma. Her main concern is that she becomes fatigued during the day feels sleepy and wakes up not rested and restored.She often wakes up with headaches,  dull throbbing headache that is present when she wakes up, but does  not wake her out of sleep. She is sleepy while driving.   Sleep habits are as follows: She is usually watching TV before she goes to bed, and bedtime is around 11 PM, but she often has fallen asleep in her recliner watching TV. She then has difficulties going back to sleep in her bed. Her bedroom is cool quiet and dark.She usually falls asleep on multiple pillows in supine position almost reclined, but her husband has witnessed her to turn prone soon after 1 AM and sleep face down. She has to urinate every couple of hours , 3 times at night, and further wakes sometimes up without the urge to urinate, with a parched, dry mouth. She keeps a bottle of water next to her. She keeps a fan. She works from [employer] which means that she  gets up at around 6.30 AM, Accompanied by a dull headache.She uses her phone is alarm, she does not wake up spontaneously. She has noted that if she can sleep in ( on weekends)she gets a more severe headache. She does not take any naps during the day, and not on weekends either.   Sleep medical history and family sleep history:  Both parents and her daughter have OSA and CPAP.   Social history: Brandi Howell is married and the mother of 5 daughters, all adult. She has no grandchildren. She has no history of tobacco use, she does not drink alcohol, She drinks coffee 2-3 times a week, she drinks sodas 2-3 times a week. She also drinks ice tea for lunch, she does not exceed 2 caffeine beverages a day. No shift work history , she was always sleepy. 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   Review of Systems: Out of a complete 14 system review, the patient complains of only the following symptoms, and all other reviewed systems are negative.  The patient reports weight gain, fatigue, daytime sleepiness and snoring, morning headaches, swelling in both lower extremities, cough, wheezing and snoring, blurred vision, feeling hot at night, increased thirst. She endorsed apart medical history of hypertension, diabetes, high cholesterol and migraines as well as asthma,is status post tubal ligation, cholecystectomy and arthroscopic knee surgery on the right.  Epworth score 15/24  , Fatigue severity score 22  , depression score n/a -   on CPAP : 06-07-2018 She endorsed now the Epworth sleepiness score at 3 points, she is no longer excessively fatigued 9-63 points, she feels that she sleeps for most of the night that she uses CPAP which would make 8 hours or more. She hated the FFM and switches to pillows with good success. She no longer sleeps without, has no sleep attacks at work, more energy and she is losing weight ( 27 pounds since June !). Her mood has improved.  Her headaches are  now rare !!! Her hemoglobin A1c is improved to 7.     Social History   Socioeconomic History  . Marital status: Married    Spouse name: Not on file  . Number of children: Not on file  . Years of education: Not on file  . Highest education level: Not on file  Occupational History  . Not on file  Social Needs  . Financial resource strain: Not on file  . Food insecurity:    Worry: Not on file    Inability: Not on file  . Transportation needs:    Medical: Not on file    Non-medical: Not on file  Tobacco Use  . Smoking status: Never Smoker  . Smokeless tobacco: Never Used  Substance and Sexual Activity  . Alcohol use: Yes    Frequency: Never    Comment: occ  . Drug use: No  . Sexual activity: Not on file  Lifestyle  . Physical activity:    Days per week: Not on file    Minutes per session: Not on file  . Stress: Not on file  Relationships  . Social connections:    Talks on phone: Not on file    Gets together: Not on file    Attends religious service: Not on file    Active member of club or organization: Not on file    Attends meetings of clubs or organizations: Not on file    Relationship status: Not on file  . Intimate partner violence:    Fear of current or ex partner: Not on file    Emotionally abused: Not on file    Physically abused: Not on file    Forced sexual activity: Not on file  Other Topics Concern  . Not on file  Social History Narrative  . Not on file    No family history on file.  Past Medical History:  Diagnosis Date  . Asthma   . Diabetes mellitus without complication (HCC)   . Goiter   . Gout   . Hypercholesteremia   . Hypertension   . Vitamin D deficiency     Past Surgical History:  Procedure Laterality Date  . CHOLECYSTECTOMY    . KNEE ARTHROSCOPY    . KNEE ARTHROSCOPY Left 10/25/2017   Procedure: LEFT KNEE ARTHROSCOPY WITH MEDIAL MENISECTOMY, CHONDROPLASTY;  Surgeon: Gean Birchwood, MD;  Location: [facility] OR;  Service: Orthopedics;   Laterality: Left;  . TUBAL LIGATION      Current Outpatient Medications  Medication Sig Dispense Refill  . albuterol (PROAIR HFA) 108 (90 Base) MCG/ACT inhaler Inhale 2 puffs into the lungs every 6 (six) hours as needed for wheezing or shortness of breath.    . allopurinol (ZYLOPRIM) 100 MG tablet Take 100 mg by mouth daily.    Brandi Howell amLODipine-olmesartan (AZOR) 5-40 MG tablet Take 1 tablet by mouth daily. 30 tablet 5  . apixaban (ELIQUIS) 5 MG TABS tablet Take 1 tablet (5 mg total) by mouth 2 (two) times daily. Start around 03/24/18 after finishing the starter pack 60 tablet 5  . budesonide-formoterol (SYMBICORT) 160-4.5 MCG/ACT inhaler Inhale 2 puffs into the lungs 2 (two) times daily.    . cetirizine (ZYRTEC) 10 MG tablet Take 10 mg by mouth daily.    . fluticasone (FLONASE) 50 MCG/ACT nasal spray Place 2 sprays into both nostrils daily.    . furosemide (LASIX) 40 MG tablet Take 1 tablet (40 mg total) by mouth daily. 30 tablet 3  . guaiFENesin (MUCINEX) 600 MG 12 hr tablet Take 1 tablet (600 mg total) by mouth 2 (two) times daily. 20 tablet 1  . montelukast (SINGULAIR) 10 MG tablet Take 10 mg by mouth daily.    . OXYGEN 2lpm 24/7 [facility]    . Semaglutide (OZEMPIC) 0.25 or 0.5 MG/DOSE SOPN 1 injection every wk    . UNABLE TO FIND Med Name: CPAP with o2 bled in 2lpm     No current facility-administered medications for this visit.     Allergies as of 06/07/2018 - Review Complete 06/07/2018  Allergen Reaction Noted  . Lisinopril Cough 07/21/2017  . Statins Other (See Comments) 02/19/2018    Vitals: BP Brandi Howell)  158/92   Pulse 87   Ht 5\' 3"  (1.6 m)   Wt 287 lb (130.2 kg)   BMI 50.84 kg/m  Last Weight:  Wt Readings from Last 1 Encounters:  06/07/18 287 lb (130.2 kg)   QPY:PPJK mass index is 50.84 kg/m.     Last Height:   Ht Readings from Last 1 Encounters:  06/07/18 5\' 3"  (1.6 m)    Physical exam:  General: The patient is awake, alert and appears not in acute distress. The patient is  well groomed. Head: Normocephalic, atraumatic. Neck is supple. Mallampati 5  neck circumference:20.25 inches . Nasal airflow patent, TMJ click -not  evident . Retrognathia is seen. Biological teeth.  Cardiovascular:  Regular rate and rhythm , without  murmurs or carotid bruit, and without distended neck veins. Respiratory: Lungs are clear to auscultation. Skin:  Without evidence of edema or rash- no facial pressure marks.  Trunk: BMI is down to 50.84 from 55.    Neurologic exam : The patient is awake and alert, oriented to place and time.   Memory subjective described as intact.  Cranial nerves: Pupils are equal and briskly reactive to light. Extraocular movements  in vertical and horizontal planes intact and without nystagmus. Visual fields by finger perimetry are intact. Hearing to finger rub intact.  Facial sensation intact to fine touch. Facial motor strength is symmetric and tongue and uvula move midline. Shoulder shrug was symmetrical.   Motor exam:  Normal tone, muscle bulk and symmetric strength in all extremities. Sensory:  Fine touch, pinprick and vibration were tested in all extremities. Proprioception tested in the upper extremities was normal. Coordination: Rapid alternating movements/  Finger-to-nose maneuver  normal without evidence of ataxia, dysmetria or tremor.  Gait and station: Patient walks without assistive device  Deep tendon reflexes: in the  upper and lower extremities are symmetric and intact.    Assessment:  After physical and neurologic examination, review of laboratory studies,  Personal review of imaging studies, reports of other /same  Imaging studies, results of polysomnography and / or neurophysiology testing and pre-existing records as far as provided in visit., my assessment is    1) Obstructive sleep apnea , severe at AHI 140/h with severe hypoxemia 80 minutes of 83 minutes sleep time, now on CPAP 11 c water, oxygen 2 liters  And feeling good!   The  patient was advised of the nature of the diagnosed disorder , the treatment options and the  risks for general health and wellness arising from not treating the condition.   I spent more than 20 minutes of face to face time with the patient. Greater than 50% of time was spent in counseling and coordination of care. We have discussed the diagnosis and differential and I answered the patient's questions.    RV every year .   Melvyn Novas, MD 06/07/2018, 3:26 PM  Certified in Neurology by [employer]BP[employer] Certified in Sleep Medicine by [facility] [address] [place], [place] [address]

## 2018-06-09 DIAGNOSIS — I110 Hypertensive heart disease with heart failure: Secondary | ICD-10-CM | POA: Diagnosis not present

## 2018-06-09 DIAGNOSIS — Z0000 Encounter for general adult medical examination without abnormal findings: Secondary | ICD-10-CM | POA: Diagnosis not present

## 2018-06-09 DIAGNOSIS — E559 Vitamin D deficiency, unspecified: Secondary | ICD-10-CM | POA: Diagnosis not present

## 2018-06-09 DIAGNOSIS — E1165 Type 2 diabetes mellitus with hyperglycemia: Secondary | ICD-10-CM | POA: Diagnosis not present

## 2018-06-09 DIAGNOSIS — Z23 Encounter for immunization: Secondary | ICD-10-CM | POA: Diagnosis not present

## 2018-06-09 DIAGNOSIS — I5030 Unspecified diastolic (congestive) heart failure: Secondary | ICD-10-CM | POA: Diagnosis not present

## 2018-06-10 DIAGNOSIS — G4733 Obstructive sleep apnea (adult) (pediatric): Secondary | ICD-10-CM | POA: Diagnosis not present

## 2018-06-25 DIAGNOSIS — I5030 Unspecified diastolic (congestive) heart failure: Secondary | ICD-10-CM | POA: Diagnosis not present

## 2018-06-27 DIAGNOSIS — I5030 Unspecified diastolic (congestive) heart failure: Secondary | ICD-10-CM | POA: Diagnosis not present

## 2018-06-30 ENCOUNTER — Ambulatory Visit (INDEPENDENT_AMBULATORY_CARE_PROVIDER_SITE_OTHER): Payer: BLUE CROSS/BLUE SHIELD | Admitting: Cardiology

## 2018-06-30 ENCOUNTER — Encounter: Payer: Self-pay | Admitting: Cardiology

## 2018-06-30 ENCOUNTER — Other Ambulatory Visit: Payer: Self-pay

## 2018-06-30 VITALS — BP 173/93 | HR 85 | Ht 62.0 in | Wt 287.2 lb

## 2018-06-30 DIAGNOSIS — I10 Essential (primary) hypertension: Secondary | ICD-10-CM

## 2018-06-30 DIAGNOSIS — G4733 Obstructive sleep apnea (adult) (pediatric): Secondary | ICD-10-CM

## 2018-06-30 DIAGNOSIS — Z79899 Other long term (current) drug therapy: Secondary | ICD-10-CM | POA: Diagnosis not present

## 2018-06-30 DIAGNOSIS — R072 Precordial pain: Secondary | ICD-10-CM

## 2018-06-30 DIAGNOSIS — I5032 Chronic diastolic (congestive) heart failure: Secondary | ICD-10-CM

## 2018-06-30 DIAGNOSIS — Z6841 Body Mass Index (BMI) 40.0 and over, adult: Secondary | ICD-10-CM

## 2018-06-30 DIAGNOSIS — Z7189 Other specified counseling: Secondary | ICD-10-CM | POA: Diagnosis not present

## 2018-06-30 DIAGNOSIS — Z9989 Dependence on other enabling machines and devices: Secondary | ICD-10-CM

## 2018-06-30 DIAGNOSIS — E119 Type 2 diabetes mellitus without complications: Secondary | ICD-10-CM

## 2018-06-30 DIAGNOSIS — E782 Mixed hyperlipidemia: Secondary | ICD-10-CM

## 2018-06-30 MED ORDER — DAPAGLIFLOZIN PROPANEDIOL 5 MG PO TABS: 5.0000 mg | ORAL_TABLET | Freq: Every day | ORAL | 1 refills | Status: DC

## 2018-06-30 MED ORDER — SPIRONOLACTONE 25 MG PO TABS: 25.0000 mg | ORAL_TABLET | Freq: Every day | ORAL | 3 refills | Status: DC

## 2018-06-30 NOTE — Patient Instructions (Signed)
Medication Instructions: Your Physician recommend you make the following changes to your medication. -Start: Spironolactone 25 mg daily    If you need a refill on your cardiac medications before your next appointment, please call your pharmacy.   Labwork: Your physician recommends that you return for lab work in 1 week (BMP)   Procedures/Testing: None  Follow-Up: Your physician wants you to follow-up in 3 months with Dr. Cristal Deer.   Special Instructions:    Thank you for choosing [facility] at [facility]!!

## 2018-06-30 NOTE — Progress Notes (Signed)
Cardiology Office Note:    Date:  06/30/2018   ID:  Ashok Pall, DOB 30-Aug-1966, MRN [id number]  PCP:  Glendale Chard, MD  Cardiologist:  Buford Dresser, MD PhD  Referring MD: Glendale Chard, MD   CC: follow up   History of Present Illness:    Brandi Howell is a 52 y.o. female with a hx of diastolic heart failure who is seen in follow up for the evaluation and management of shortness of breath and chest pain.  She was initially seen by me on 04/07/18. She had recently been hospitalized in 01/2018 with blood clots in the lungs, pneumonia, and heart failure. She also endorsed left chest tightness intermittently. Plan at that time was for CT coronary angiography.   Today: hasn't worn oxygen in some time, had her breathing test done with pulmonology and showed good progress. Breathing is feeling much better, though not yet back to baseline. Still has some chest discomfort with exerting herself. Also feels it in her shoulder and back. It is left chest tightness, lasting seconds to minutes, seems random. No associated symptoms. Back to work now. Using CPAP every night, feels tremendously improved with it.   Blood pressure is elevated today; doesn't check them at home because she has had issues with the monitors reading correctly. Prior reading at neurology on 06/07/18 was 158/92.  Working on weight loss gradually, has met with nutritionist. Going to the gym twice a week.   Rest of ROS negative, as below.  Past Medical History:  Diagnosis Date  . Asthma   . Diabetes mellitus without complication ([place])   . Goiter   . Gout   . Hypercholesteremia   . Hypertension   . Vitamin D deficiency     Past Surgical History:  Procedure Laterality Date  . CHOLECYSTECTOMY    . KNEE ARTHROSCOPY    . KNEE ARTHROSCOPY Left 10/25/2017   Procedure: LEFT KNEE ARTHROSCOPY WITH MEDIAL MENISECTOMY, CHONDROPLASTY;  Surgeon: Frederik Pear, MD;  Location: [facility];  Service: Orthopedics;   Laterality: Left;  . TUBAL LIGATION      Current Medications: Current Outpatient Medications on File Prior to Visit  Medication Sig  . albuterol (PROAIR HFA) 108 (90 Base) MCG/ACT inhaler Inhale 2 puffs into the lungs every 6 (six) hours as needed for wheezing or shortness of breath.  . allopurinol (ZYLOPRIM) 100 MG tablet Take 100 mg by mouth daily.  Marland Kitchen amLODipine-olmesartan (AZOR) 5-40 MG tablet Take 1 tablet by mouth daily.  Marland Kitchen apixaban (ELIQUIS) 5 MG TABS tablet Take 1 tablet (5 mg total) by mouth 2 (two) times daily. Start around 03/24/18 after finishing the starter pack  . budesonide-formoterol (SYMBICORT) 160-4.5 MCG/ACT inhaler Inhale 2 puffs into the lungs 2 (two) times daily.  . cetirizine (ZYRTEC) 10 MG tablet Take 10 mg by mouth daily.  . fluticasone (FLONASE) 50 MCG/ACT nasal spray Place 2 sprays into both nostrils daily.  . furosemide (LASIX) 40 MG tablet Take 1 tablet (40 mg total) by mouth daily.  Marland Kitchen guaiFENesin (MUCINEX) 600 MG 12 hr tablet Take 1 tablet (600 mg total) by mouth 2 (two) times daily.  . montelukast (SINGULAIR) 10 MG tablet Take 10 mg by mouth daily.  . OXYGEN 2lpm 24/7 AHC  . Semaglutide (OZEMPIC) 0.25 or 0.5 MG/DOSE SOPN 1 injection every wk  . UNABLE TO FIND Med Name: CPAP with o2 bled in 2lpm  . Vitamin D, Ergocalciferol, (DRISDOL) 50000 units CAPS capsule Take 1 capsule by mouth 2 (two) times  a week.   No current facility-administered medications on file prior to visit.      Allergies:   Lisinopril and Statins   Social History   Socioeconomic History  . Marital status: Married    Spouse name: Not on file  . Number of children: Not on file  . Years of education: Not on file  . Highest education level: Not on file  Occupational History  . Not on file  Social Needs  . Financial resource strain: Not on file  . Food insecurity:    Worry: Not on file    Inability: Not on file  . Transportation needs:    Medical: Not on file    Non-medical: Not on  file  Tobacco Use  . Smoking status: Never Smoker  . Smokeless tobacco: Never Used  Substance and Sexual Activity  . Alcohol use: Yes    Frequency: Never    Comment: occ  . Drug use: No  . Sexual activity: Not on file  Lifestyle  . Physical activity:    Days per week: Not on file    Minutes per session: Not on file  . Stress: Not on file  Relationships  . Social connections:    Talks on phone: Not on file    Gets together: Not on file    Attends religious service: Not on file    Active member of club or organization: Not on file    Attends meetings of clubs or organizations: Not on file    Relationship status: Not on file  Other Topics Concern  . Not on file  Social History Narrative  . Not on file     Family History: The patient's Family history: gma died at age 18 from MI. That gma's sister had AAA. Father has 2 prior aortic valve surgeries, died at age 9. Brother died at age 81 of an MI. Mother with cholesterol and high blood pressure.  ROS:   Please see the history of present illness.  Additional pertinent ROS: Review of Systems  Constitutional: Positive for weight loss. Negative for chills and fever.  HENT: Negative for hearing loss.   Eyes: Negative for double vision.  Respiratory: Positive for shortness of breath. Negative for cough and stridor.   Cardiovascular: Positive for chest pain. Negative for palpitations, orthopnea, claudication, leg swelling and PND.  Gastrointestinal: Negative for abdominal pain and blood in stool.  Genitourinary: Negative for hematuria.  Musculoskeletal: Positive for joint pain. Negative for falls.  Skin: Negative for rash.  Neurological: Negative for focal weakness and loss of consciousness.  Endo/Heme/Allergies: Does not bruise/bleed easily.   EKGs/Labs/Other Studies Reviewed:    The following studies were reviewed today: Echo from 02/20/18 Study Conclusions  - Procedure narrative: Transthoracic echocardiography. Image    quality was suboptimal. Technically difficult study. - Left ventricle: The cavity size was normal. Wall thickness was   increased in a pattern of moderate LVH. Systolic function was   normal. The estimated ejection fraction was in the range of 60%   to 65%. Wall motion was normal; there were no regional wall   motion abnormalities. Doppler parameters are consistent with   abnormal left ventricular relaxation (grade 1 diastolic   dysfunction). The Ee&' ratio is >15, suggesting elevated LV   filling pressure. - Left atrium: The atrium was normal in size. - Inferior vena cava: The vessel was dilated. The respirophasic   diameter changes were blunted (< 50%), consistent with elevated   central venous pressure.  Impressions: - Technically difficult study. 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  CTA 02/20/18 FINDINGS: Cardiovascular: Satisfactory opacification of the pulmonary arteries to the segmental level. Small nonocclusive pulmonary emboli within segmental and subsegmental branches of the basilar segments of the left lower lobe. Suspected subsegmental nonocclusive pulmonary emboli in the right lower lobe. No evidence of right heart strain. No pericardial effusion. Grossly normal thoracic aorta.  Mediastinum/Nodes: No enlarged mediastinal, hilar, or axillary lymph nodes. Thyroid gland, trachea, and esophagus demonstrate no significant findings.  Lungs/Pleura: Bilateral patchy areas of airspace consolidation throughout both lungs with upper lobe predominance.  Upper Abdomen: No acute abnormality.  Musculoskeletal: No chest wall abnormality. No acute or significant osseous findings.  Review of the MIP images confirms the above findings.  IMPRESSION: Scattered nonobstructive pulmonary emboli within segmental and subsegmental branches of the basilar segments of the left lower lobe.  Suspected subsegmental nonocclusive  pulmonary emboli in the right lower lobe.  No evidence of right heart strain.  Multifocal bilateral airspace consolidation with upper lobe predominance. In the appropriate clinical setting, this likely represents multifocal pneumonia. Some of the peripheral areas of consolidation may potentially represent pulmonary infarctions, however the degree and distribution of airspace consolidation is out of proportion to the small peripheral pulmonary emboli seen, and therefore infectious etiology is favored.  EKG:  EKG is ordered today.  The ekg ordered today demonstrates normal sinus rhythm, LVH with repol abnormalities.  Recent Labs: 02/19/2018: B Natriuretic Peptide 123.6; TSH 0.498 02/21/2018: Hemoglobin 11.6; Platelets 204 02/22/2018: Magnesium 2.2 04/07/2018: BUN 9; Creatinine, Ser 0.66; Potassium 3.5; Sodium 142  Recent Lipid Panel    Component Value Date/Time   CHOL 223 (H) 04/07/2018 1132   TRIG 204 (H) 04/07/2018 1132   HDL 36 (L) 04/07/2018 1132   CHOLHDL 6.2 (H) 04/07/2018 1132   LDLCALC 146 (H) 04/07/2018 1132    Physical Exam:    VS:  BP (!) 173/93   Pulse 85   Ht _0  (1.575 m)   Wt 287 lb 3.2 oz (130.3 kg)   SpO2 97%   BMI 52.53 kg/m     Wt Readings from Last 3 Encounters:  06/30/18 287 lb 3.2 oz (130.3 kg)  06/07/18 287 lb (130.2 kg)  05/20/18 290 lb (131.5 kg)     GEN: Well nourished, well developed in no acute distress HEENT: NCAT NECK: No JVD at 90 degrees; No carotid bruits LYMPHATICS: No lymphadenopathy CARDIAC: regular rhythm, normal S1 and S2, no rubs, gallops. Faint 1/6 early systolic murmur. RESPIRATORY:  Clear to auscultation without rales, wheezing or rhonchi  ABDOMEN: Soft, non-tender, non-distended MUSCULOSKELETAL:  No edema in bilateral lower extremities; No deformity  SKIN: Warm and dry NEUROLOGIC:  Alert and oriented x 3 PSYCHIATRIC:  Normal affect   ASSESSMENT:    1. Medication management    PLAN:    1. Chest pain -We discussed  this at a prior visit. Plan had been for CT coronary angiography, but she is nervous about having any more testing. We discussed that it is her choice what she would like to pursue, and she would like to wait on doing the CT coronary angiography until after she tries the spironolactone (below). She already has the dose of metoprolol if she decides to pursue the scan. She will need a new order, so she will call us if she wishes to pursue this.   2. Chronic diastolic heart failure -appears nearly euvolemic today.  Doing well on furosemide 40 mg. Educated on the cause of diastolic heart failure and the current management approach -given her elevated BP and her diastolic heart failure, will add spironolactone 25 mg daily. Recheck BMET in 1 week.  3. Hypertension -amlodipine 74m-olmesartan 40 mg combo pill. She is on furosemide for a diuretic.  -starting spironolactone 25 mg daily today, with BMET recheck in a week.  -She wants to have her PCP manage her blood pressure given her frequency of visits, but happy to help if we can be of assistance.  4. Obesity -met with nutrition, got a lot of good information. -counseled on diet, activity, and weight loss.   5. Hyperlipidemia -Lipids elevated on 03/2018.  The 10-year ASCVD risk score (Mikey BussingDC JBrooke Bonito, et al., 2013) is: 44.4%   Values used to calculate the score:     Age: [age]years     Sex: Female     Is Non-Hispanic African American: Yes     Diabetic: Yes     Tobacco smoker: No     Systolic Blood Pressure: 1377mmHg     Is BP treated: Yes     HDL Cholesterol: 36 mg/dL     Total Cholesterol: 223 mg/dL -she reports being tried on multiple different statins with myalgia. Most recently reports being on pravastatin, had muscle aches which cease when she stopped statin. Does not think she has tried ezetimibe.  -she wants to only start one medication at a time, and we are starting spironolactone today. She would benefit from better lipid control. Would  consider ezetimibe, thought this will not likely give her enough of an improvement. If she pursues CT coronary with evidence of disease, could consider PCSK-9 inhibitor.  6. sleep apnea on CPAP and home O2 7. Recent DVT/PE: was planned to see hematology to determine duration of AC. 8. Diabetes, type II -lifestyle as above -managed with semaglutide. From a CV perspective, this or an SGLT2 inhibitor would potentially be beneficial for her in the long term.  Follow up in 3 mos to monitor and discuss next steps.  Medication Adjustments/Labs and Tests Ordered: Current medicines are reviewed at length with the patient today.  Concerns regarding medicines are outlined above.  Orders Placed This Encounter  Procedures  . Basic metabolic panel   Meds ordered this encounter  Medications  . spironolactone (ALDACTONE) 25 MG tablet    Sig: Take 1 tablet (25 mg total) by mouth daily.    Dispense:  90 tablet    Refill:  3    Patient Instructions  Medication Instructions: Your Physician recommend you make the following changes to your medication. -Start: Spironolactone 25 mg daily    If you need a refill on your cardiac medications before your next appointment, please call your pharmacy.   Labwork: Your physician recommends that you return for lab work in 1 week (BMP)   Procedures/Testing: None  Follow-Up: Your physician wants you to follow-up in 3 months with Dr. CHarrell Gave   Special Instructions:    Thank you for choosing [facility]!       Signed, BBuford Dresser MD PhD 06/30/2018 4:55 PM    [facility]

## 2018-07-10 DIAGNOSIS — G4733 Obstructive sleep apnea (adult) (pediatric): Secondary | ICD-10-CM | POA: Diagnosis not present

## 2018-07-25 DIAGNOSIS — I5030 Unspecified diastolic (congestive) heart failure: Secondary | ICD-10-CM | POA: Diagnosis not present

## 2018-07-28 ENCOUNTER — Other Ambulatory Visit: Payer: Self-pay | Admitting: Internal Medicine

## 2018-07-28 DIAGNOSIS — I5030 Unspecified diastolic (congestive) heart failure: Secondary | ICD-10-CM | POA: Diagnosis not present

## 2018-07-29 ENCOUNTER — Other Ambulatory Visit: Payer: Self-pay

## 2018-07-29 MED ORDER — SEMAGLUTIDE (1 MG/DOSE) 2 MG/1.5ML ~~LOC~~ SOPN: 1.0000 mg | PEN_INJECTOR | SUBCUTANEOUS | 2 refills | Status: DC

## 2018-07-29 MED ORDER — DAPAGLIFLOZIN PROPANEDIOL 10 MG PO TABS: 10.0000 mg | ORAL_TABLET | Freq: Every day | ORAL | 0 refills | Status: DC

## 2018-08-25 DIAGNOSIS — I5030 Unspecified diastolic (congestive) heart failure: Secondary | ICD-10-CM | POA: Diagnosis not present

## 2018-08-27 DIAGNOSIS — I5030 Unspecified diastolic (congestive) heart failure: Secondary | ICD-10-CM | POA: Diagnosis not present

## 2018-09-09 DIAGNOSIS — G4733 Obstructive sleep apnea (adult) (pediatric): Secondary | ICD-10-CM | POA: Diagnosis not present

## 2018-09-11 ENCOUNTER — Other Ambulatory Visit: Payer: Self-pay | Admitting: Internal Medicine

## 2018-09-13 ENCOUNTER — Ambulatory Visit: Payer: BLUE CROSS/BLUE SHIELD | Admitting: Internal Medicine

## 2018-09-13 ENCOUNTER — Other Ambulatory Visit: Payer: Self-pay | Admitting: Internal Medicine

## 2018-09-13 ENCOUNTER — Encounter: Payer: Self-pay | Admitting: Internal Medicine

## 2018-09-13 VITALS — BP 132/86 | HR 84 | Temp 98.3°F | Ht 62.8 in | Wt 281.0 lb

## 2018-09-13 DIAGNOSIS — E1165 Type 2 diabetes mellitus with hyperglycemia: Secondary | ICD-10-CM

## 2018-09-13 DIAGNOSIS — I110 Hypertensive heart disease with heart failure: Secondary | ICD-10-CM

## 2018-09-13 DIAGNOSIS — G4733 Obstructive sleep apnea (adult) (pediatric): Secondary | ICD-10-CM

## 2018-09-13 DIAGNOSIS — I5032 Chronic diastolic (congestive) heart failure: Secondary | ICD-10-CM | POA: Diagnosis not present

## 2018-09-13 DIAGNOSIS — Z9989 Dependence on other enabling machines and devices: Secondary | ICD-10-CM

## 2018-09-13 DIAGNOSIS — Z6841 Body Mass Index (BMI) 40.0 and over, adult: Secondary | ICD-10-CM

## 2018-09-13 MED ORDER — SEMAGLUTIDE (1 MG/DOSE) 2 MG/1.5ML ~~LOC~~ SOPN: 1.0000 mg | PEN_INJECTOR | SUBCUTANEOUS | 2 refills | Status: DC

## 2018-09-13 MED ORDER — APIXABAN 5 MG PO TABS: 5.0000 mg | ORAL_TABLET | Freq: Two times a day (BID) | ORAL | 5 refills | Status: DC

## 2018-09-13 MED ORDER — PITAVASTATIN CALCIUM 4 MG PO TABS: 4.0000 mg | ORAL_TABLET | Freq: Every day | ORAL | 2 refills | Status: DC

## 2018-09-13 MED ORDER — MONTELUKAST SODIUM 10 MG PO TABS: 10.0000 mg | ORAL_TABLET | Freq: Every day | ORAL | 1 refills | Status: DC

## 2018-09-13 NOTE — Patient Instructions (Signed)

## 2018-09-14 LAB — CMP14+EGFR
ALT: 24 IU/L (ref 0–32)
AST: 16 IU/L (ref 0–40)
Albumin/Globulin Ratio: 1.7 (ref 1.2–2.2)
Albumin: 4.4 g/dL (ref 3.5–5.5)
Alkaline Phosphatase: 89 IU/L (ref 39–117)
BUN/Creatinine Ratio: 16 (ref 9–23)
BUN: 14 mg/dL (ref 6–24)
Bilirubin Total: 0.3 mg/dL (ref 0.0–1.2)
CO2: 25 mmol/L (ref 20–29)
Calcium: 9.8 mg/dL (ref 8.7–10.2)
Chloride: 100 mmol/L (ref 96–106)
Creatinine, Ser: 0.85 mg/dL (ref 0.57–1.00)
GFR calc Af Amer: 92 mL/min/{1.73_m2} (ref >59)
GFR calc non Af Amer: 80 mL/min/{1.73_m2} (ref >59)
Globulin, Total: 2.6 g/dL (ref 1.5–4.5)
Glucose: 99 mg/dL (ref 65–99)
POTASSIUM: 4 mmol/L (ref 3.5–5.2)
Sodium: 141 mmol/L (ref 134–144)
Total Protein: 7 g/dL (ref 6.0–8.5)

## 2018-09-14 LAB — HEMOGLOBIN A1C
Est. average glucose Bld gHb Est-mCnc: 146 mg/dL
Hgb A1c MFr Bld: 6.7 % — ABNORMAL HIGH (ref 4.8–5.6)

## 2018-09-14 NOTE — Progress Notes (Signed)
Here are your lab results:  Your kidney and liver function are normal. Your hba1c is 6.7, this is AWESOME!!!!! I am so proud of you!!! Keep up the great work! All of your hard work is paying off!  Happy holidays to you and your family!  Sincerely,    Chrishonda Hesch N. Allyne GeeSanders, MD

## 2018-09-24 DIAGNOSIS — I5030 Unspecified diastolic (congestive) heart failure: Secondary | ICD-10-CM | POA: Diagnosis not present

## 2018-09-26 ENCOUNTER — Encounter: Payer: Self-pay | Admitting: Internal Medicine

## 2018-09-26 NOTE — Progress Notes (Signed)
Subjective:     Patient ID: Brandi Howell , female    DOB: [birth year] , 52 y.o.   MRN: [id number]   Chief Complaint  Patient presents with  . Diabetes  . Hypertension    HPI  Diabetes  She presents for her follow-up diabetic visit. She has type 2 diabetes mellitus. Her disease course has been stable. There are no hypoglycemic associated symptoms. Pertinent negatives for diabetes include no blurred vision and no chest pain. There are no hypoglycemic complications. Risk factors for coronary artery disease include dyslipidemia, diabetes mellitus, hypertension, obesity and sedentary lifestyle.  Hypertension  This is a chronic problem. The current episode started more than 1 year ago. The problem has been gradually improving since onset. The problem is controlled. Pertinent negatives include no blurred vision or chest pain.   She reports compliance with meds.   Past Medical History:  Diagnosis Date  . Asthma   . Diabetes mellitus without complication ([place])   . Goiter   . Gout   . Hypercholesteremia   . Hypertension   . Vitamin D deficiency      History reviewed. No pertinent family history.   Current Outpatient Medications:  .  albuterol (PROAIR HFA) 108 (90 Base) MCG/ACT inhaler, Inhale 2 puffs into the lungs every 6 (six) hours as needed for wheezing or shortness of breath., Disp: , Rfl:  .  allopurinol (ZYLOPRIM) 100 MG tablet, Take 100 mg by mouth daily., Disp: , Rfl:  .  amLODipine-olmesartan (AZOR) 5-40 MG tablet, Take 1 tablet by mouth daily., Disp: 30 tablet, Rfl: 5 .  apixaban (ELIQUIS) 5 MG TABS tablet, Take 1 tablet (5 mg total) by mouth 2 (two) times daily. Start around 03/24/18 after finishing the starter pack, Disp: 60 tablet, Rfl: 5 .  budesonide-formoterol (SYMBICORT) 160-4.5 MCG/ACT inhaler, Inhale 2 puffs into the lungs as needed. , Disp: , Rfl:  .  FARXIGA 10 MG TABS tablet, TAKE 1 TABLET BY MOUTH DAILY, Disp: 90 tablet, Rfl: 0 .  fluticasone (FLONASE) 50  MCG/ACT nasal spray, Place 2 sprays into both nostrils daily., Disp: , Rfl:  .  furosemide (LASIX) 40 MG tablet, Take 1 tablet (40 mg total) by mouth daily., Disp: 30 tablet, Rfl: 3 .  OXYGEN, 2lpm 24/7 AHC, Disp: , Rfl:  .  Semaglutide, 1 MG/DOSE, (OZEMPIC, 1 MG/DOSE,) 2 MG/1.5ML SOPN, Inject 1 mg into the skin once a week., Disp: 1 pen, Rfl: 2 .  spironolactone (ALDACTONE) 25 MG tablet, Take 1 tablet (25 mg total) by mouth daily., Disp: 90 tablet, Rfl: 3 .  UNABLE TO FIND, Med Name: CPAP with o2 bled in 2lpm, Disp: , Rfl:  .  Vitamin D, Ergocalciferol, (DRISDOL) 1.25 MG (50000 UT) CAPS capsule, TAKE ONE CAPSULE BY MOUTH TWICE A WEEK ON TUESDAYS AND FRIDAYS, Disp: 8 capsule, Rfl: 0 .  cetirizine (ZYRTEC) 10 MG tablet, Take 10 mg by mouth daily., Disp: , Rfl:  .  montelukast (SINGULAIR) 10 MG tablet, TAKE 1 TABLET BY MOUTH EVERY DAY, Disp: 30 tablet, Rfl: 0 .  Pitavastatin Calcium (LIVALO) 4 MG TABS, Take 1 tablet (4 mg total) by mouth daily., Disp: 30 tablet, Rfl: 2   Allergies  Allergen Reactions  . Lisinopril Cough  . Statins Other (See Comments)    Cause muscle weakness     Review of Systems  Constitutional: Negative.   Eyes: Negative for blurred vision.  Respiratory: Negative.   Cardiovascular: Negative.  Negative for chest pain.  Gastrointestinal: Negative.  Neurological: Negative.   Psychiatric/Behavioral: Negative.      Today's Vitals   09/13/18 0942  BP: 132/86  Pulse: 84  Temp: 98.3 F (36.8 C)  TempSrc: Oral  SpO2: 95%  Weight: 281 lb (127.5 kg)  Height: 5' 2.8" (1.595 m)  PainSc: 0-No pain   Body mass index is 50.09 kg/m.   Objective:  Physical Exam Vitals signs and nursing note reviewed.  Constitutional:      Appearance: Normal appearance. She is obese.  HENT:     Head: Normocephalic and atraumatic.  Cardiovascular:     Rate and Rhythm: Normal rate and regular rhythm.     Heart sounds: Normal heart sounds.  Pulmonary:     Effort: Pulmonary effort is  normal.     Breath sounds: Normal breath sounds.  Skin:    General: Skin is warm.  Neurological:     General: No focal deficit present.  Psychiatric:        Mood and Affect: Mood normal.         Assessment And Plan:     1. Uncontrolled type 2 diabetes mellitus with hyperglycemia ([place])  I will check labs as listed below. She will continue with [place]. She is encouraged to avoid processed meats.   - CMP14+EGFR - Hemoglobin A1c  2. Hypertensive heart failure (HCC)  Chronic, yet stable. She is encouraged to strive for bp less than 130/80.   Importance of both dietary, medication and exercise compliance was discussed with the patient.   3. Chronic diastolic heart failure (HCC)  Chronic, yet stable. She is encouraged to follow a low-salt diet.   4. OSA on CPAP  Chronic. Importance of CPAP compliance was discussed with the patient.   5. Class 3 severe obesity due to excess calories with serious comorbidity and body mass index (BMI) of 50.0 to 59.9 in adult [place])  She was congratulated on her weight loss thus far and encouraged to keep up the great work. Our initial goal is get her BMI less than 45 to decrease cardiac risk. She is encouraged to commit to 30 minutes of exercise five days weekly.   Maximino Greenland, MD

## 2018-09-27 DIAGNOSIS — I5030 Unspecified diastolic (congestive) heart failure: Secondary | ICD-10-CM | POA: Diagnosis not present

## 2018-09-29 ENCOUNTER — Encounter: Payer: Self-pay | Admitting: Internal Medicine

## 2018-10-05 ENCOUNTER — Encounter: Payer: Self-pay | Admitting: Cardiology

## 2018-10-05 ENCOUNTER — Ambulatory Visit (INDEPENDENT_AMBULATORY_CARE_PROVIDER_SITE_OTHER): Payer: BLUE CROSS/BLUE SHIELD | Admitting: Cardiology

## 2018-10-05 VITALS — BP 136/78 | HR 83 | Ht 64.0 in | Wt 279.8 lb

## 2018-10-05 DIAGNOSIS — Z7189 Other specified counseling: Secondary | ICD-10-CM

## 2018-10-05 DIAGNOSIS — Z86711 Personal history of pulmonary embolism: Secondary | ICD-10-CM | POA: Diagnosis not present

## 2018-10-05 DIAGNOSIS — I10 Essential (primary) hypertension: Secondary | ICD-10-CM

## 2018-10-05 DIAGNOSIS — I5032 Chronic diastolic (congestive) heart failure: Secondary | ICD-10-CM | POA: Diagnosis not present

## 2018-10-05 DIAGNOSIS — G4733 Obstructive sleep apnea (adult) (pediatric): Secondary | ICD-10-CM | POA: Diagnosis not present

## 2018-10-05 DIAGNOSIS — Z9989 Dependence on other enabling machines and devices: Secondary | ICD-10-CM

## 2018-10-05 DIAGNOSIS — R0609 Other forms of dyspnea: Secondary | ICD-10-CM

## 2018-10-05 DIAGNOSIS — R072 Precordial pain: Secondary | ICD-10-CM

## 2018-10-05 DIAGNOSIS — R0600 Dyspnea, unspecified: Secondary | ICD-10-CM

## 2018-10-05 DIAGNOSIS — E782 Mixed hyperlipidemia: Secondary | ICD-10-CM

## 2018-10-05 NOTE — Patient Instructions (Signed)

## 2018-10-05 NOTE — Progress Notes (Signed)
Cardiology Office Note:    Date:  10/05/2018   ID:  Brandi Howell, DOB [birth year], MRN [id number]  PCP:  Dorothyann Peng, MD  Cardiologist:  Jodelle Red, MD PhD  Referring MD: Dorothyann Peng, MD   CC: follow up   History of Present Illness:    Brandi Howell is a 53 y.o. female with a hx of diastolic heart failure who is seen in follow up for the evaluation and management of shortness of breath and chest pain.  She was initially seen by me on 04/07/18. She had recently been hospitalized in 01/2018 with blood clots in the lungs, pneumonia, and heart failure. She also endorsed left chest tightness intermittently. Plan at that time was for CT coronary angiography. She decided to wait on having this and see if her symptoms improved.   Today: breathing is doing very well, uses CPAP and O2 at night. Checks pulse ox sporadically, stays in the upper 90s. Hasn't felt limited by her breathing; can climb stairs, back to the gym. Uses elliptical, has worked up to 15-20 minutes at a time. Does some circuit training as well. Chest tightness with exertion is nearly gone. Has lost 8 lbs since October, working on continued weight loss. Watching her diet. Blood pressure is better controlled.   We discussed what had been decided on duration of anticoagulation for her DVT/PE. She never received a follow up with them post hospitalization. We again reviewed her risk factors: this is her first episode of clot, but there were no clear provoking factors. Summary described below.   Rest of ROS negative, as below.  Past Medical History:  Diagnosis Date  . Asthma   . Diabetes mellitus without complication (HCC)   . Goiter   . Gout   . Hypercholesteremia   . Hypertension   . Vitamin D deficiency     Past Surgical History:  Procedure Laterality Date  . CHOLECYSTECTOMY    . KNEE ARTHROSCOPY    . KNEE ARTHROSCOPY Left 10/25/2017   Procedure: LEFT KNEE ARTHROSCOPY WITH MEDIAL MENISECTOMY,  CHONDROPLASTY;  Surgeon: Gean Birchwood, MD;  Location: [facility] OR;  Service: Orthopedics;  Laterality: Left;  . TUBAL LIGATION      Current Medications: Current Outpatient Medications on File Prior to Visit  Medication Sig  . albuterol (PROAIR HFA) 108 (90 Base) MCG/ACT inhaler Inhale 2 puffs into the lungs every 6 (six) hours as needed for wheezing or shortness of breath.  . allopurinol (ZYLOPRIM) 100 MG tablet Take 100 mg by mouth daily.  Marland Kitchen amLODipine-olmesartan (AZOR) 5-40 MG tablet Take 1 tablet by mouth daily.  Marland Kitchen apixaban (ELIQUIS) 5 MG TABS tablet Take 1 tablet (5 mg total) by mouth 2 (two) times daily. Start around 03/24/18 after finishing the starter pack  . budesonide-formoterol (SYMBICORT) 160-4.5 MCG/ACT inhaler Inhale 2 puffs into the lungs as needed.   . cetirizine (ZYRTEC) 10 MG tablet Take 10 mg by mouth as needed for allergies.   Marland Kitchen FARXIGA 10 MG TABS tablet TAKE 1 TABLET BY MOUTH DAILY  . fluticasone (FLONASE) 50 MCG/ACT nasal spray Place 2 sprays into both nostrils as needed for allergies.   . furosemide (LASIX) 40 MG tablet Take 1 tablet (40 mg total) by mouth daily.  . montelukast (SINGULAIR) 10 MG tablet TAKE 1 TABLET BY MOUTH EVERY DAY  . OXYGEN 2lpm 24/7 AHC  . Pitavastatin Calcium (LIVALO) 4 MG TABS Take 1 tablet (4 mg total) by mouth daily.  . Semaglutide, 1 MG/DOSE, (OZEMPIC, 1 MG/DOSE,)  2 MG/1.5ML SOPN Inject 1 mg into the skin once a week.  . spironolactone (ALDACTONE) 25 MG tablet Take 1 tablet (25 mg total) by mouth daily.  Marland Kitchen UNABLE TO FIND Med Name: CPAP with o2 bled in 2lpm  . Vitamin D, Ergocalciferol, (DRISDOL) 1.25 MG (50000 UT) CAPS capsule TAKE ONE CAPSULE BY MOUTH TWICE A WEEK ON TUESDAYS AND FRIDAYS   No current facility-administered medications on file prior to visit.      Allergies:   Lisinopril and Statins   Social History   Socioeconomic History  . Marital status: Married    Spouse name: Not on file  . Number of children: Not on file  . Years of  education: Not on file  . Highest education level: Not on file  Occupational History  . Not on file  Social Needs  . Financial resource strain: Not on file  . Food insecurity:    Worry: Not on file    Inability: Not on file  . Transportation needs:    Medical: Not on file    Non-medical: Not on file  Tobacco Use  . Smoking status: Never Smoker  . Smokeless tobacco: Never Used  Substance and Sexual Activity  . Alcohol use: Yes    Frequency: Never    Comment: occ  . Drug use: No  . Sexual activity: Not on file  Lifestyle  . Physical activity:    Days per week: Not on file    Minutes per session: Not on file  . Stress: Not on file  Relationships  . Social connections:    Talks on phone: Not on file    Gets together: Not on file    Attends religious service: Not on file    Active member of club or organization: Not on file    Attends meetings of clubs or organizations: Not on file    Relationship status: Not on file  Other Topics Concern  . Not on file  Social History Narrative  . Not on file     Family History: The patient's Family history: gma died at age 80 from MI. That gma's sister had AAA. Father has 2 prior aortic valve surgeries, died at age 5. Brother died at age 67 of an MI. Mother with cholesterol and high blood pressure.  ROS:   Please see the history of present illness.  Additional pertinent ROS: Review of Systems  Constitutional: Positive for weight loss. Negative for chills and fever.  HENT: Negative for hearing loss.   Eyes: Negative for double vision.  Respiratory: Positive for shortness of breath. Negative for cough and stridor.   Cardiovascular: Positive for chest pain. Negative for palpitations, orthopnea, claudication, leg swelling and PND.  Gastrointestinal: Negative for abdominal pain and blood in stool.  Genitourinary: Negative for hematuria.  Musculoskeletal: Positive for joint pain. Negative for falls.  Skin: Negative for rash.    Neurological: Negative for focal weakness and loss of consciousness.  Endo/Heme/Allergies: Does not bruise/bleed easily.   EKGs/Labs/Other Studies Reviewed:    The following studies were reviewed today: Echo from 02/20/18 Study Conclusions  - Procedure narrative: Transthoracic echocardiography. Image   quality was suboptimal. Technically difficult study. - Left ventricle: The cavity size was normal. Wall thickness was   increased in a pattern of moderate LVH. Systolic function was   normal. The estimated ejection fraction was in the range of 60%   to 65%. Wall motion was normal; there were no regional wall   motion abnormalities. Doppler  parameters are consistent with   abnormal left ventricular relaxation (grade 1 diastolic   dysfunction). The Ee&' ratio is >15, suggesting elevated LV   filling pressure. - Left atrium: The atrium was normal in size. - Inferior vena cava: The vessel was dilated. The respirophasic   diameter changes were blunted (< 50%), consistent with elevated   central venous pressure.  Impressions: - Technically difficult study. 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  CTA 02/20/18 FINDINGS: Cardiovascular: Satisfactory opacification of the pulmonary arteries to the segmental level. Small nonocclusive pulmonary emboli within segmental and subsegmental branches of the basilar segments of the left lower lobe. Suspected subsegmental nonocclusive pulmonary emboli in the right lower lobe. No evidence of right heart strain. No pericardial effusion. Grossly normal thoracic aorta.  Mediastinum/Nodes: No enlarged mediastinal, hilar, or axillary lymph nodes. Thyroid gland, trachea, and esophagus demonstrate no significant findings.  Lungs/Pleura: Bilateral patchy areas of airspace consolidation throughout both lungs with upper lobe predominance.  Upper Abdomen: No acute abnormality.  Musculoskeletal: No  chest wall abnormality. No acute or significant osseous findings.  Review of the MIP images confirms the above findings.  IMPRESSION: Scattered nonobstructive pulmonary emboli within segmental and subsegmental branches of the basilar segments of the left lower lobe.  Suspected subsegmental nonocclusive pulmonary emboli in the right lower lobe.  No evidence of right heart strain.  Multifocal bilateral airspace consolidation with upper lobe predominance. In the appropriate clinical setting, this likely represents multifocal pneumonia. Some of the peripheral areas of consolidation may potentially represent pulmonary infarctions, however the degree and distribution of airspace consolidation is out of proportion to the small peripheral pulmonary emboli seen, and therefore infectious etiology is favored.  EKG:  EKG is personally reviewed.  The ekg ordered previously demonstrates normal sinus rhythm, LVH with repol abnormalities.  Recent Labs: 02/19/2018: B Natriuretic Peptide 123.6; TSH 0.498 02/21/2018: Hemoglobin 11.6; Platelets 204 02/22/2018: Magnesium 2.2 09/13/2018: ALT 24; BUN 14; Creatinine, Ser 0.85; Potassium 4.0; Sodium 141  Recent Lipid Panel    Component Value Date/Time   CHOL 223 (H) 04/07/2018 1132   TRIG 204 (H) 04/07/2018 1132   HDL 36 (L) 04/07/2018 1132   CHOLHDL 6.2 (H) 04/07/2018 1132   LDLCALC 146 (H) 04/07/2018 1132    Physical Exam:    VS:  BP 136/78   Pulse 83   Ht 5\' 4"  (1.626 m)   Wt 279 lb 12.8 oz (126.9 kg)   SpO2 98%   BMI 48.03 kg/m     Wt Readings from Last 3 Encounters:  10/05/18 279 lb 12.8 oz (126.9 kg)  09/13/18 281 lb (127.5 kg)  06/30/18 287 lb 3.2 oz (130.3 kg)     GEN: Well nourished, well developed in no acute distress HEENT: NCAT NECK: No JVD at 90 degrees; No carotid bruits LYMPHATICS: No lymphadenopathy CARDIAC: regular rhythm, normal S1 and S2, no rubs, gallops. Faint 1/6 early systolic murmur. RESPIRATORY:  Clear  to auscultation without rales, wheezing or rhonchi  ABDOMEN: Soft, non-tender, non-distended MUSCULOSKELETAL:  No edema in bilateral lower extremities; No deformity  SKIN: Warm and dry NEUROLOGIC:  Alert and oriented x 3 PSYCHIATRIC:  Normal affect   ASSESSMENT:    1. History of pulmonary embolism   2. Chronic diastolic heart failure (HCC) Chronic  3. Essential hypertension   4. OSA on CPAP   5. Morbid obesity (HCC)   6. Mixed hyperlipidemia   7. Precordial pain   8.  Dyspnea on exertion   9. Counseling on health promotion and disease prevention    PLAN:    Shortness of breath, and chest tightness with exertion -we have discussed extensively at prior visit. She is feeling much better and would like to defer testing at this time.   Recent DVT/PE: tolerating anticoagulation well. We discussed current guidelines for unprovoked DVT/PE. We discussed pros/cons of both continuing anticoagulation and stopping anticoagulation. She wants to think about it and discuss at next visit. For now, continue anticoagulation.  Chronic diastolic heart failure -appears euvolemic today. Doing well on furosemide 40 mg and spironolactone 25 mg daily.   Hypertension -amlodipine 5mg -olmesartan 40 mg combo pill.  -furosemide and spironolactone as above -improving today from prior, likely 2/2 med changes and weight loss/lifestyle changes  Obesity -working on weight loss, down 8 lbs. Increasing exercise, working on diet changes  Hyperlipidemia -Lipids elevated on 03/2018 with LDL 146, TG 204. Recheck 05/2018 LDL 111, TG 140.  The 10-year ASCVD risk score Denman George(Goff DC Montez HagemanJr., et al., 2013) is: 20.1%   Values used to calculate the score:     Age: [age] years     Sex: Female     Is Non-Hispanic African American: Yes     Diabetic: Yes     Tobacco smoker: No     Systolic Blood Pressure: 136 mmHg     Is BP treated: Yes     HDL Cholesterol: 36 mg/dL     Total Cholesterol: 223 mg/dL -on pitavastatin, tolerating well.  Has had issues in the past with other statins.   ASCVD prevention and risk factor management: -recommend heart healthy/Mediterranean diet, with whole grains, fruits, vegetable, fish, lean meats, nuts, and olive oil. Limit salt. -recommend moderate walking, 3-5 times/week for 30-50 minutes each session. Aim for at least 150 minutes.week. Goal should be pace of 3 miles/hours, or walking 1.5 miles in 30 minutes -recommend avoidance of tobacco products. Avoid excess alcohol. -sleep apnea: on CPAP and home O2- -Diabetes, type II: on farxiga  Follow up in 6 mos to discuss anticoagulation again.  TIME SPENT WITH PATIENT: 25 minutes of direct patient care. More than 50% of that time was spent on coordination of care and counseling regarding symptoms, anticoagulation options for history of PE.  Jodelle RedBridgette Jayzon Taras, MD, PhD [place]  [facility]   Medication Adjustments/Labs and Tests Ordered: Current medicines are reviewed at length with the patient today.  Concerns regarding medicines are outlined above.  No orders of the defined types were placed in this encounter.  No orders of the defined types were placed in this encounter.   Patient Instructions  Medication Instructions:  Your Physician recommend you continue on your current medication as directed.    If you need a refill on your cardiac medications before your next appointment, please call your pharmacy.   Lab work: None  Testing/Procedures: None  Follow-Up: [facility], you and your health needs are our priority.  As part of our continuing mission to provide you with exceptional heart care, we have created designated Provider Care Teams.  These Care Teams include your primary Cardiologist (physician) and Advanced Practice Providers (APPs -  Physician Assistants and Nurse Practitioners) who all work together to provide you with the care you need, when you need it. You will need a follow up appointment in 6 months.   Please call our office 2 months in advance to schedule this appointment.  You may see Jodelle RedBridgette Nieves Chapa, MD or one of the  following Advanced Practice Providers on your designated Care Team:   Theodore Demark, PA-C . Joni Reining, DNP, ANP        Signed, Jodelle Red, MD PhD 10/05/2018 9:46 AM    [facility]

## 2018-10-10 DIAGNOSIS — G4733 Obstructive sleep apnea (adult) (pediatric): Secondary | ICD-10-CM | POA: Diagnosis not present

## 2018-10-12 ENCOUNTER — Emergency Department (HOSPITAL_COMMUNITY): Admission: EM | Admit: 2018-10-12 | Payer: BLUE CROSS/BLUE SHIELD | Source: Home / Self Care

## 2018-10-25 DIAGNOSIS — I5030 Unspecified diastolic (congestive) heart failure: Secondary | ICD-10-CM | POA: Diagnosis not present

## 2018-10-28 DIAGNOSIS — I5030 Unspecified diastolic (congestive) heart failure: Secondary | ICD-10-CM | POA: Diagnosis not present

## 2018-11-07 ENCOUNTER — Other Ambulatory Visit: Payer: Self-pay | Admitting: Internal Medicine

## 2018-11-10 DIAGNOSIS — G4733 Obstructive sleep apnea (adult) (pediatric): Secondary | ICD-10-CM | POA: Diagnosis not present

## 2018-11-14 ENCOUNTER — Other Ambulatory Visit: Payer: Self-pay | Admitting: Internal Medicine

## 2018-11-25 DIAGNOSIS — I5030 Unspecified diastolic (congestive) heart failure: Secondary | ICD-10-CM | POA: Diagnosis not present

## 2018-11-26 DIAGNOSIS — I5030 Unspecified diastolic (congestive) heart failure: Secondary | ICD-10-CM | POA: Diagnosis not present

## 2018-12-05 ENCOUNTER — Other Ambulatory Visit: Payer: Self-pay | Admitting: Internal Medicine

## 2018-12-06 ENCOUNTER — Telehealth: Payer: Self-pay

## 2018-12-06 NOTE — Telephone Encounter (Signed)
PA STARTED FOR LIVALO

## 2018-12-07 ENCOUNTER — Telehealth: Payer: Self-pay

## 2018-12-07 NOTE — Telephone Encounter (Signed)
Pt and pharmacy notified of prior auth approval of livalo

## 2018-12-14 ENCOUNTER — Ambulatory Visit: Payer: BLUE CROSS/BLUE SHIELD | Admitting: Internal Medicine

## 2018-12-23 ENCOUNTER — Other Ambulatory Visit: Payer: Self-pay | Admitting: Internal Medicine

## 2018-12-24 DIAGNOSIS — I5030 Unspecified diastolic (congestive) heart failure: Secondary | ICD-10-CM | POA: Diagnosis not present

## 2018-12-25 DIAGNOSIS — I5030 Unspecified diastolic (congestive) heart failure: Secondary | ICD-10-CM | POA: Diagnosis not present

## 2018-12-26 ENCOUNTER — Telehealth: Payer: Self-pay

## 2018-12-26 NOTE — Telephone Encounter (Signed)
Called to see if pt wanted to have a video message

## 2018-12-27 ENCOUNTER — Ambulatory Visit: Payer: BLUE CROSS/BLUE SHIELD | Admitting: Internal Medicine

## 2019-01-24 DIAGNOSIS — I5030 Unspecified diastolic (congestive) heart failure: Secondary | ICD-10-CM | POA: Diagnosis not present

## 2019-01-26 DIAGNOSIS — I5030 Unspecified diastolic (congestive) heart failure: Secondary | ICD-10-CM | POA: Diagnosis not present

## 2019-01-26 IMAGING — MR MR KNEE*L* W/O CM
4 of 5 series · 21 of 40 positions shown · non-contrast
Comparison: None.

CLINICAL DATA: Left knee pain and swelling x3 months without known
injury or surgery. No relief from joint injection.

EXAM:
MRI OF THE LEFT KNEE WITHOUT CONTRAST
TECHNIQUE: Multiplanar, multisequence MR imaging of the knee was performed. No
intravenous contrast was administered.

[Series 3: pd_tse_fs_tra · axial · 4.0mm · 0.42mm/px · z∈[+3,+82]mm · 3 of 26 slices shown]
[im 4/26]
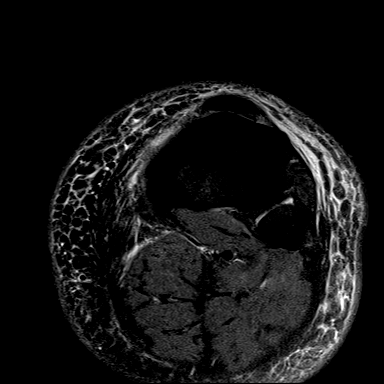
[im 13/26]
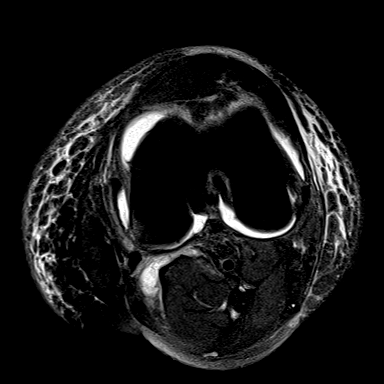
[im 22/26]
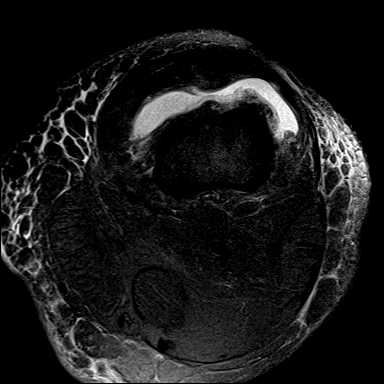

[Series 5: T2 fat-sat · coronal · 3.2mm · 0.62mm/px · 8 of 28 slices shown]
[im 1/28]
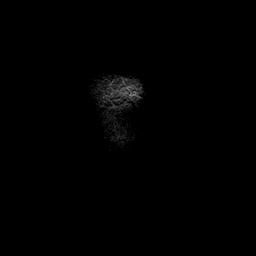
[im 4/28]
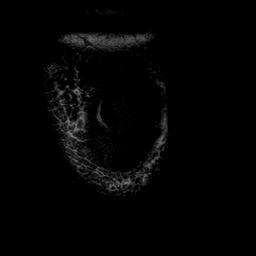
[im 8/28]
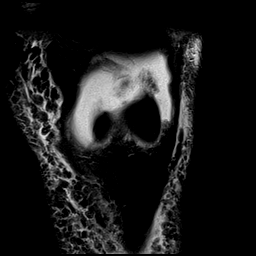
[im 12/28]
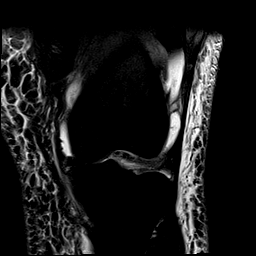
[im 16/28]
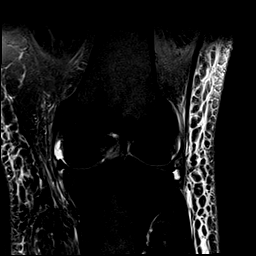
[im 20/28]
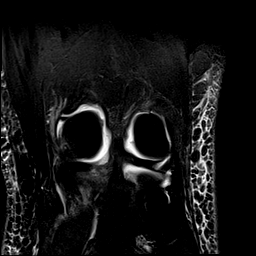
[im 24/28]
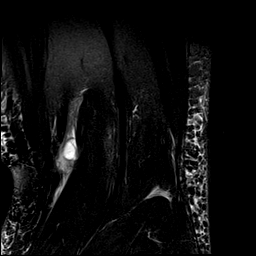
[im 28/28]
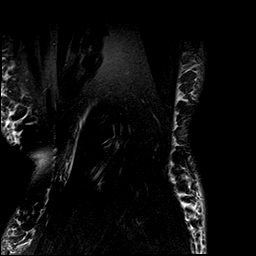

[Series 6: T1 · coronal · 3.2mm · 0.25mm/px · 3 of 28 slices shown]
[im 4/28]
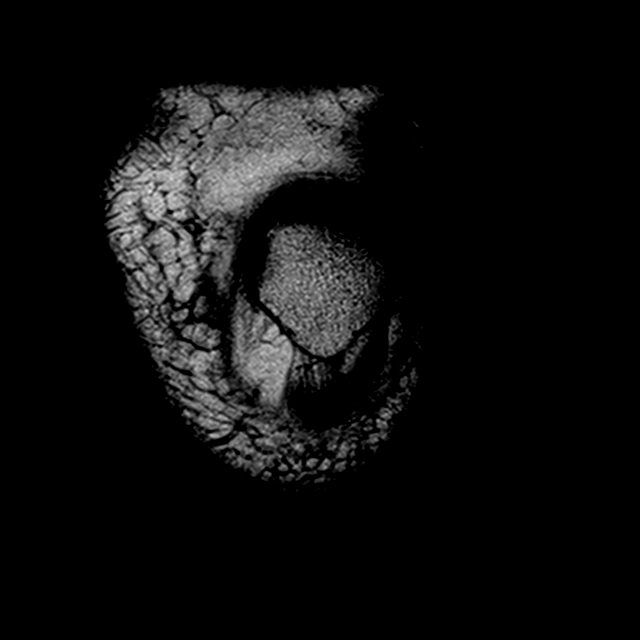
[im 16/28]
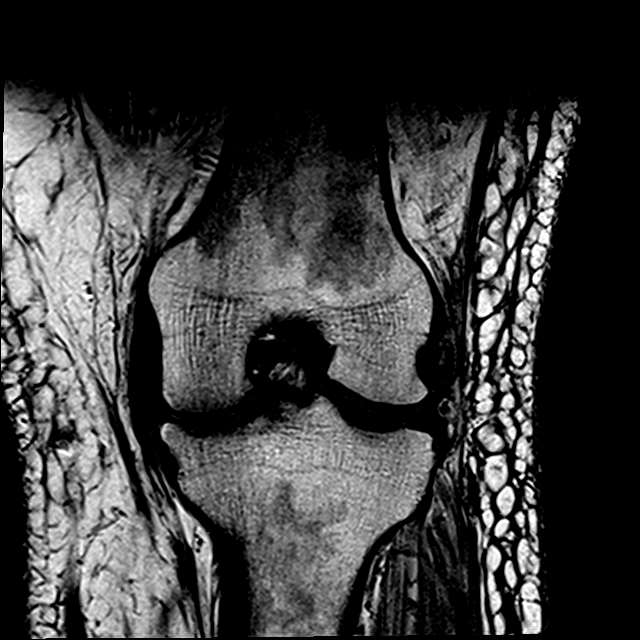
[im 24/28]
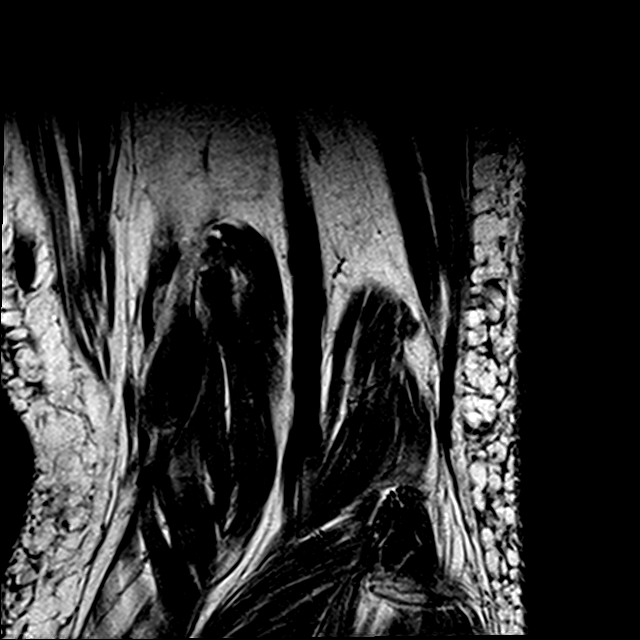

[Series 7: PD fat-sat · sagittal · 3.5mm · 0.25mm/px · 7 of 23 slices shown]
[im 1/23]
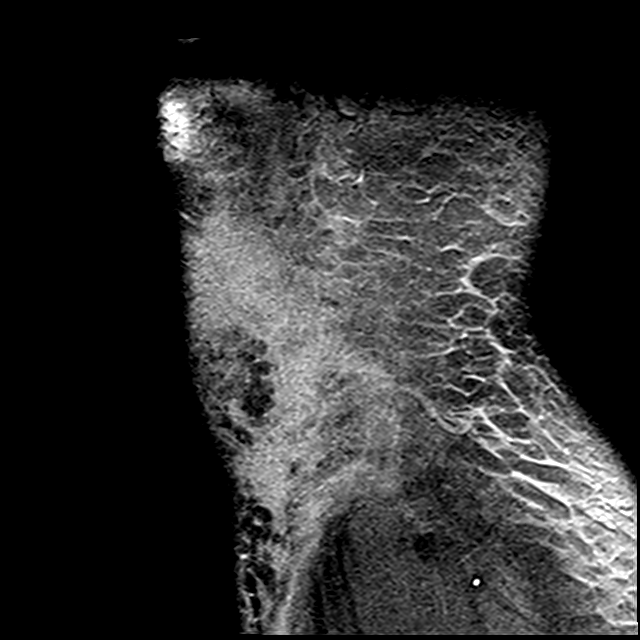
[im 4/23]
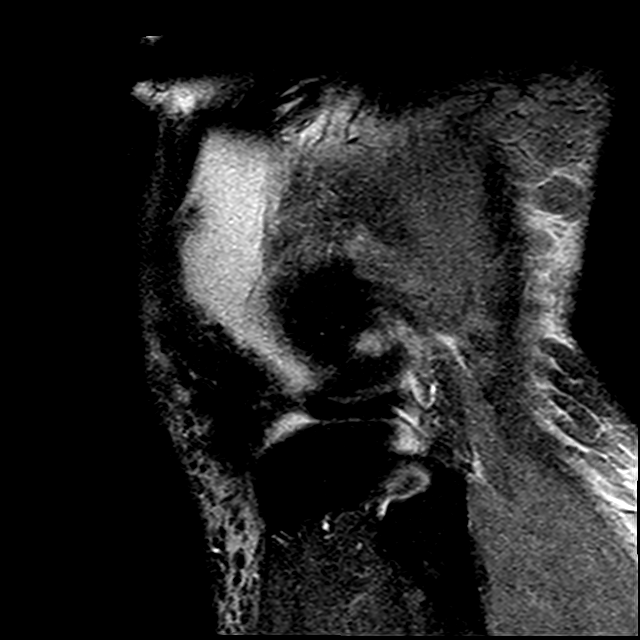
[im 8/23]
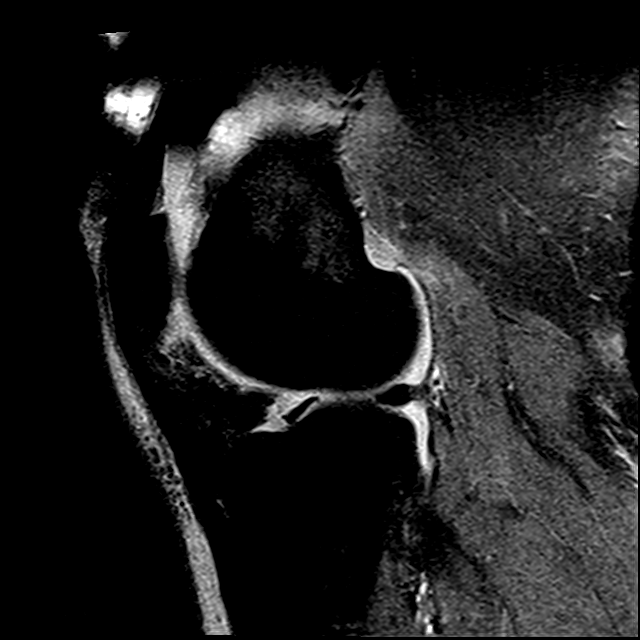
[im 12/23]
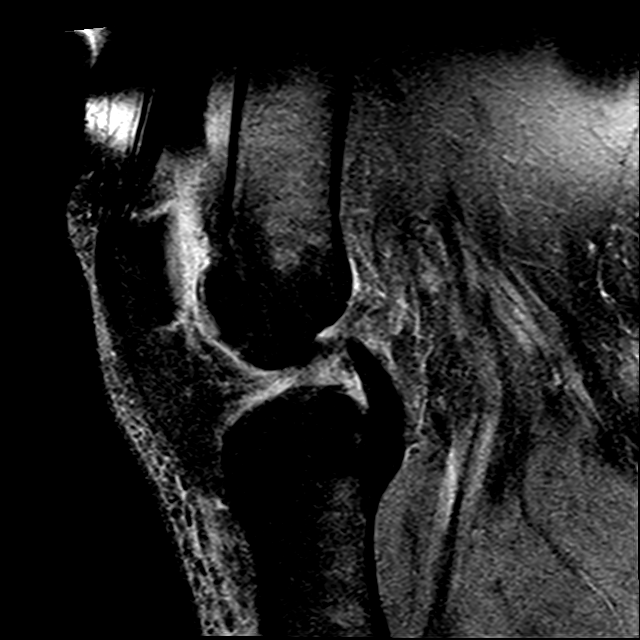
[im 15/23]
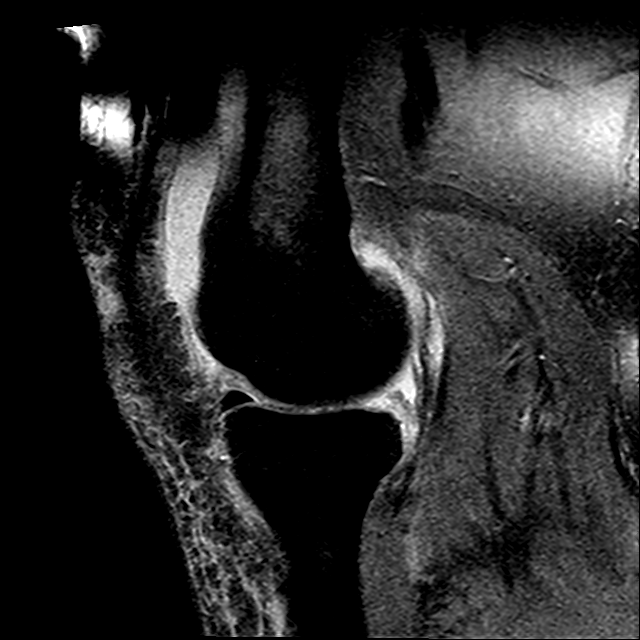
[im 19/23]
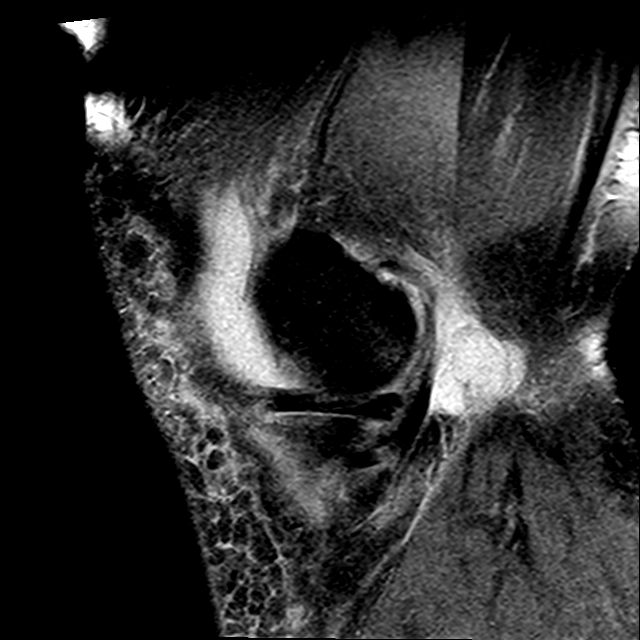
[im 23/23]
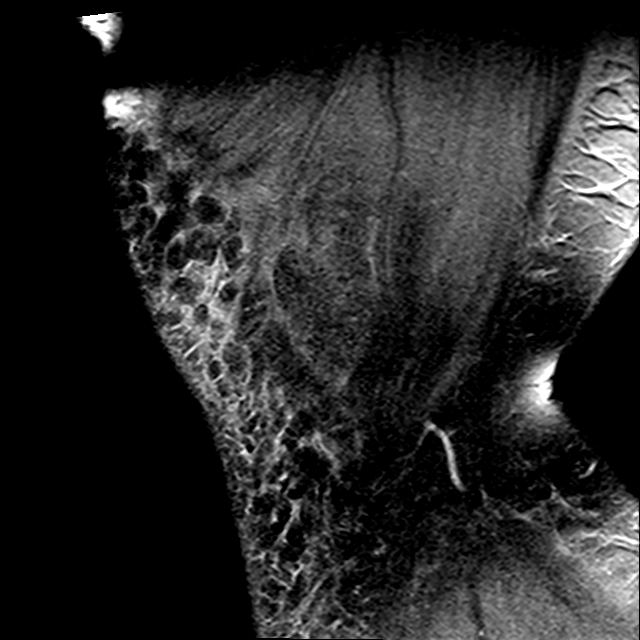

[21 of 40 positions shown; findings below may reference images not displayed]

FINDINGS: MENISCI

Medial meniscus: Radial tear of the posteromesial corner of the
medial meniscus, series [DATE] and 10, coronal series [DATE].

Lateral meniscus:  Intact

LIGAMENTS

Cruciates:  Intact

Collaterals:  Intact

CARTILAGE

Patellofemoral:  No chondral defect.

Medial: Mild to moderate partial-thickness cartilage loss of the
medial femorotibial compartment.

Lateral:  No chondral defect.

Joint: Small to moderate suprapatellar joint effusion. Intact
Hoffa's fat pad. No plical thickening.

Popliteal Fossa: 2.3 x 2.2 x 0.7 cm popliteal cyst. Intact
popliteus.

Extensor Mechanism: Intact extensor mechanism tendons and patellar
retinaculum.

Bones: No focal marrow signal abnormality. No fracture or
dislocation.

Other: Diffuse subcutaneous edema about the knee.
IMPRESSION: 1. Radial tear of the posteromesial corner of the medial meniscus.
2. Mild chondrosis of the medial femorotibial compartment.
3. 2.3 x 2.2 x 0.7 cm popliteal cyst.
4. Mild-to-moderate diffuse subcutaneous edema noted about the knee.
5. Small to moderate suprapatellar joint effusion.
6. Intact cruciate and collateral ligaments.

## 2019-02-04 ENCOUNTER — Other Ambulatory Visit: Payer: Self-pay | Admitting: Internal Medicine

## 2019-02-10 ENCOUNTER — Other Ambulatory Visit: Payer: Self-pay | Admitting: Surgery

## 2019-02-10 DIAGNOSIS — E049 Nontoxic goiter, unspecified: Secondary | ICD-10-CM

## 2019-02-10 DIAGNOSIS — E042 Nontoxic multinodular goiter: Secondary | ICD-10-CM

## 2019-02-23 DIAGNOSIS — I5030 Unspecified diastolic (congestive) heart failure: Secondary | ICD-10-CM | POA: Diagnosis not present

## 2019-02-26 DIAGNOSIS — I5030 Unspecified diastolic (congestive) heart failure: Secondary | ICD-10-CM | POA: Diagnosis not present

## 2019-02-28 ENCOUNTER — Telehealth: Payer: Self-pay | Admitting: *Deleted

## 2019-02-28 NOTE — Telephone Encounter (Signed)
Unable to leave a message.

## 2019-03-21 ENCOUNTER — Other Ambulatory Visit: Payer: Self-pay | Admitting: Internal Medicine

## 2019-03-22 ENCOUNTER — Telehealth: Payer: Self-pay

## 2019-03-22 NOTE — Telephone Encounter (Signed)
I left the pt a message to call the office back.  The pt needs an appt for a f/u.

## 2019-03-23 ENCOUNTER — Other Ambulatory Visit: Payer: Self-pay

## 2019-03-23 MED ORDER — AMLODIPINE-OLMESARTAN 10-40 MG PO TABS: 1.0000 | ORAL_TABLET | Freq: Every day | ORAL | 0 refills | Status: DC

## 2019-03-23 NOTE — Telephone Encounter (Signed)
The pt was scheduled an appt with her consent for a virtual appt.  The pt was told to make sure she makes her appt for future refills.

## 2019-03-26 DIAGNOSIS — I5030 Unspecified diastolic (congestive) heart failure: Secondary | ICD-10-CM | POA: Diagnosis not present

## 2019-03-28 DIAGNOSIS — I5030 Unspecified diastolic (congestive) heart failure: Secondary | ICD-10-CM | POA: Diagnosis not present

## 2019-04-19 ENCOUNTER — Ambulatory Visit: Payer: Self-pay | Admitting: Internal Medicine

## 2019-04-20 ENCOUNTER — Encounter: Payer: Self-pay | Admitting: Internal Medicine

## 2019-04-20 ENCOUNTER — Other Ambulatory Visit: Payer: Self-pay

## 2019-04-20 ENCOUNTER — Ambulatory Visit (INDEPENDENT_AMBULATORY_CARE_PROVIDER_SITE_OTHER): Payer: BC Managed Care – PPO | Admitting: Internal Medicine

## 2019-04-20 VITALS — BP 132/76 | HR 81 | Temp 97.1°F | Ht 64.0 in | Wt 280.0 lb

## 2019-04-20 DIAGNOSIS — Z6841 Body Mass Index (BMI) 40.0 and over, adult: Secondary | ICD-10-CM

## 2019-04-20 DIAGNOSIS — Z1211 Encounter for screening for malignant neoplasm of colon: Secondary | ICD-10-CM

## 2019-04-20 DIAGNOSIS — G4734 Idiopathic sleep related nonobstructive alveolar hypoventilation: Secondary | ICD-10-CM | POA: Diagnosis not present

## 2019-04-20 DIAGNOSIS — I110 Hypertensive heart disease with heart failure: Secondary | ICD-10-CM | POA: Diagnosis not present

## 2019-04-20 DIAGNOSIS — I5032 Chronic diastolic (congestive) heart failure: Secondary | ICD-10-CM

## 2019-04-20 DIAGNOSIS — G4733 Obstructive sleep apnea (adult) (pediatric): Secondary | ICD-10-CM | POA: Diagnosis not present

## 2019-04-20 DIAGNOSIS — Z9989 Dependence on other enabling machines and devices: Secondary | ICD-10-CM

## 2019-04-20 DIAGNOSIS — E1165 Type 2 diabetes mellitus with hyperglycemia: Secondary | ICD-10-CM | POA: Diagnosis not present

## 2019-04-21 DIAGNOSIS — E119 Type 2 diabetes mellitus without complications: Secondary | ICD-10-CM | POA: Diagnosis not present

## 2019-04-21 DIAGNOSIS — E785 Hyperlipidemia, unspecified: Secondary | ICD-10-CM | POA: Diagnosis not present

## 2019-04-21 DIAGNOSIS — I10 Essential (primary) hypertension: Secondary | ICD-10-CM | POA: Diagnosis not present

## 2019-04-21 LAB — BASIC METABOLIC PANEL
BUN: 20 (ref 4–21)
Creatinine: 0.9 (ref 0.5–1.1)
Glucose: 116
Potassium: 4.2 (ref 3.4–5.3)
Sodium: 141 (ref 137–147)

## 2019-04-21 LAB — HEPATIC FUNCTION PANEL
ALT: 31 (ref 7–35)
AST: 21 (ref 13–35)
Alkaline Phosphatase: 89 (ref 25–125)
Bilirubin, Total: 0.3

## 2019-04-21 LAB — HEMOGLOBIN A1C: Hemoglobin A1C: 6.7

## 2019-04-21 LAB — LIPID PANEL
Cholesterol: 163 (ref 0–200)
HDL: 43 (ref 35–70)
LDL Cholesterol: 97
Triglycerides: 114 (ref 40–160)

## 2019-04-23 NOTE — Progress Notes (Signed)
Virtual Visit via Video   This visit type was conducted due to national recommendations for restrictions regarding the COVID-19 Pandemic (e.g. social distancing) in an effort to limit this patient's exposure and mitigate transmission in our community.  Due to her co-morbid illnesses, this patient is at least at moderate risk for complications without adequate follow up.  This format is felt to be most appropriate for this patient at this time.  All issues noted in this document were discussed and addressed.  A limited physical exam was performed with this format.    This visit type was conducted due to national recommendations for restrictions regarding the COVID-19 Pandemic (e.g. social distancing) in an effort to limit this patient's exposure and mitigate transmission in our community.  Patients identity confirmed using two different identifiers.  This format is felt to be most appropriate for this patient at this time.  All issues noted in this document were discussed and addressed.  No physical exam was performed (except for noted visual exam findings with Video Visits).    Date:  04/23/2019   ID:  Brandi Howell, DOB 23-Nov-1965, MRN [id number]  Patient Location:  Work  Provider location:   [facility]    Chief Complaint:  Diabetes check.  History of Present Illness:    Brandi Howell is a 53 y.o. female who presents via video conferencing for a telehealth visit today.    The patient does not have symptoms concerning for COVID-19 infection (fever, chills, cough, or new shortness of breath).   She presents today for virtual visit. She prefers this method of contact due to COVID-19 pandemic.  She presents today for diabetes check. She has not wanted to come in due to pandemic. She is aware of her risk factors and wants to do everything to lessen her risk. Additionally, she needs paperwork completed to get her oxygen approved by [facility].  She uses it in conjunction with her  CPAP at night.   Diabetes She presents for her follow-up diabetic visit. She has type 2 diabetes mellitus. There are no hypoglycemic associated symptoms. Pertinent negatives for diabetes include no blurred vision and no chest pain. There are no hypoglycemic complications. Risk factors for coronary artery disease include diabetes mellitus, dyslipidemia, hypertension, obesity and sedentary lifestyle. She is following a diabetic diet. She participates in exercise intermittently.     Past Medical History:  Diagnosis Date  . Asthma   . Diabetes mellitus without complication (HCC)   . Goiter   . Gout   . Hypercholesteremia   . Hypertension   . Vitamin D deficiency    Past Surgical History:  Procedure Laterality Date  . CHOLECYSTECTOMY    . KNEE ARTHROSCOPY    . KNEE ARTHROSCOPY Left 10/25/2017   Procedure: LEFT KNEE ARTHROSCOPY WITH MEDIAL MENISECTOMY, CHONDROPLASTY;  Surgeon: Gean Birchwoodowan, Frank, MD;  Location: [facility] OR;  Service: Orthopedics;  Laterality: Left;  . TUBAL LIGATION       Current Meds  Medication Sig  . albuterol (PROAIR HFA) 108 (90 Base) MCG/ACT inhaler Inhale 2 puffs into the lungs every 6 (six) hours as needed for wheezing or shortness of breath.  . allopurinol (ZYLOPRIM) 100 MG tablet Take 100 mg by mouth daily.  Marland Kitchen. amLODipine-olmesartan (AZOR) 10-40 MG tablet Take 1 tablet by mouth daily.  Marland Kitchen. apixaban (ELIQUIS) 5 MG TABS tablet Take 1 tablet (5 mg total) by mouth 2 (two) times daily. Start around 03/24/18 after finishing the starter pack (Patient taking differently: Take 5  mg by mouth daily. Start around 03/24/18 after finishing the starter pack)  . budesonide-formoterol (SYMBICORT) 160-4.5 MCG/ACT inhaler Inhale 2 puffs into the lungs as needed.   . cetirizine (ZYRTEC) 10 MG tablet Take 10 mg by mouth as needed for allergies.   Marland Kitchen. FARXIGA 10 MG TABS tablet TAKE 1 TABLET BY MOUTH DAILY  . furosemide (LASIX) 40 MG tablet Take 1 tablet (40 mg total) by mouth daily.  Marland Kitchen. LIVALO 4 MG TABS  TAKE 1 TABLET BY MOUTH DAILY  . montelukast (SINGULAIR) 10 MG tablet TAKE 1 TABLET BY MOUTH EVERY DAY  . OXYGEN at bedtime. 2lpm 24/7 [facility]   . OZEMPIC, 1 MG/DOSE, 2 MG/1.5ML SOPN INJECT 1 MG UNDER THE SKIN ONCE A WEEK  . spironolactone (ALDACTONE) 25 MG tablet Take 1 tablet (25 mg total) by mouth daily.  Marland Kitchen. UNABLE TO FIND Med Name: CPAP with o2 bled in 2lpm  . [DISCONTINUED] Vitamin D, Ergocalciferol, (DRISDOL) 1.25 MG (50000 UT) CAPS capsule TAKE 1 CAPSULE BY MOUTH TWICE A WEEK ON TUESDAYS AND FRIDAYS     Allergies:   Lisinopril and Statins   Social History   Tobacco Use  . Smoking status: Never Smoker  . Smokeless tobacco: Never Used  Substance Use Topics  . Alcohol use: Yes    Frequency: Never    Comment: occ  . Drug use: No     Family Hx: The patient's family history includes Heart disease in her father; Hyperlipidemia in her mother; Hypertension in her mother.  ROS:   Please see the history of present illness.    Review of Systems  Constitutional: Negative.   Eyes: Negative for blurred vision.  Respiratory: Negative.   Cardiovascular: Negative.  Negative for chest pain.  Gastrointestinal: Negative.   Neurological: Negative.   Psychiatric/Behavioral: Negative.     All other systems reviewed and are negative.   Labs/Other Tests and Data Reviewed:    Recent Labs: 09/13/2018: ALT 24; BUN 14; Creatinine, Ser 0.85; Potassium 4.0; Sodium 141   Recent Lipid Panel Lab Results  Component Value Date/Time   CHOL 223 (H) 04/07/2018 11:32 AM   TRIG 204 (H) 04/07/2018 11:32 AM   HDL 36 (L) 04/07/2018 11:32 AM   CHOLHDL 6.2 (H) 04/07/2018 11:32 AM   LDLCALC 146 (H) 04/07/2018 11:32 AM    Wt Readings from Last 3 Encounters:  04/20/19 280 lb (127 kg)  10/05/18 279 lb 12.8 oz (126.9 kg)  09/13/18 281 lb (127.5 kg)     Exam:    Vital Signs:  BP 132/76 (BP Location: Left Arm, Patient Position: Sitting, Cuff Size: Normal) Comment: pt provided  Pulse 81 Comment: pt  provided  Temp (!) 97.1 F (36.2 C) (Oral) Comment: pt provided  Ht 5\' 4"  (1.626 m)   Wt 280 lb (127 kg) Comment: pt provided  LMP 04/17/2019   SpO2 95%   BMI 48.06 kg/m     Physical Exam  Constitutional: She is oriented to person, place, and time and well-developed, well-nourished, and in no distress.  HENT:  Head: Normocephalic and atraumatic.  Pulmonary/Chest: Effort normal.  Neurological: She is alert and oriented to person, place, and time.  Psychiatric: Affect normal.  Nursing note and vitals reviewed.   ASSESSMENT & PLAN:     1. Uncontrolled type 2 diabetes mellitus with hyperglycemia (HCC)  Chronic. She agrees to come in next week for labwork. I will make further recommendations once her labs are available for review.   2. OSA on CPAP  Chronic. She reports compliance with CPAP. She reports feeling better and having more energy since starting CPAP.   3. Nocturnal hypoxia  Pt qualifies for nocturnal O2. I will send order to [facility] as requested.   4. Hypertensive heart failure (HCC)  Chronic, controlled on medication. She is encouraged to avoid adding salt to her foods. Importance of dietary and medication compliance was discussed with the patient.   5. Chronic diastolic heart failure (HCC)  Chronic, yet stable. She is also followed by Cardiology.   6. Screen for colon cancer  I will refer her to GI for CRC screening.   7. Class 3 severe obesity due to excess calories with serious comorbidity and body mass index (BMI) of 45.0 to 49.9 in adult [facility])  Importance of achieving optimal weight to decrease risk of cardiovascular disease and cancers was discussed with the patient in full detail. She is encouraged to start slowly - start with 10 minutes twice daily at least three to four days per week and to gradually build to 30 minutes five days weekly. She was given tips to incorporate more activity into her daily routine - take stairs when possible, park farther away  from her job, grocery stores, etc.     COVID-19 Education: The signs and symptoms of COVID-19 were discussed with the patient and how to seek care for testing (follow up with PCP or arrange E-visit).  The importance of social distancing was discussed today.  Patient Risk:   After full review of this patients clinical status, I feel that they are at least moderate risk at this time.      Medication Adjustments/Labs and Tests Ordered: Current medicines are reviewed at length with the patient today.  Concerns regarding medicines are outlined above.   Tests Ordered: No orders of the defined types were placed in this encounter.   Medication Changes: No orders of the defined types were placed in this encounter.   Disposition:  Follow up in 3 month(s)  Signed, Maximino Greenland, MD

## 2019-04-25 ENCOUNTER — Encounter: Payer: Self-pay | Admitting: Internal Medicine

## 2019-04-25 DIAGNOSIS — I5030 Unspecified diastolic (congestive) heart failure: Secondary | ICD-10-CM | POA: Diagnosis not present

## 2019-04-28 DIAGNOSIS — I5030 Unspecified diastolic (congestive) heart failure: Secondary | ICD-10-CM | POA: Diagnosis not present

## 2019-05-08 ENCOUNTER — Telehealth: Payer: Self-pay

## 2019-05-08 NOTE — Telephone Encounter (Signed)
LEFT MESSAGE. DR Baird Cancer NEEDS TO KNOW IF PT HAS RECEIVED HER SUPPLIES.

## 2019-05-10 ENCOUNTER — Other Ambulatory Visit: Payer: Self-pay | Admitting: Internal Medicine

## 2019-05-12 ENCOUNTER — Other Ambulatory Visit: Payer: Self-pay | Admitting: Cardiology

## 2019-05-12 ENCOUNTER — Other Ambulatory Visit: Payer: Self-pay | Admitting: Internal Medicine

## 2019-05-29 DIAGNOSIS — I5030 Unspecified diastolic (congestive) heart failure: Secondary | ICD-10-CM | POA: Diagnosis not present

## 2019-06-07 ENCOUNTER — Encounter: Payer: Self-pay | Admitting: Neurology

## 2019-06-12 ENCOUNTER — Other Ambulatory Visit: Payer: Self-pay

## 2019-06-12 ENCOUNTER — Ambulatory Visit (INDEPENDENT_AMBULATORY_CARE_PROVIDER_SITE_OTHER): Payer: BC Managed Care – PPO | Admitting: Neurology

## 2019-06-12 ENCOUNTER — Encounter: Payer: Self-pay | Admitting: Neurology

## 2019-06-12 VITALS — BP 152/76 | HR 91 | Temp 98.5°F | Ht 63.0 in | Wt 280.0 lb

## 2019-06-12 DIAGNOSIS — G4733 Obstructive sleep apnea (adult) (pediatric): Secondary | ICD-10-CM | POA: Diagnosis not present

## 2019-06-12 DIAGNOSIS — E662 Morbid (severe) obesity with alveolar hypoventilation: Secondary | ICD-10-CM | POA: Diagnosis not present

## 2019-06-12 DIAGNOSIS — Z6841 Body Mass Index (BMI) 40.0 and over, adult: Secondary | ICD-10-CM

## 2019-06-12 DIAGNOSIS — Z9989 Dependence on other enabling machines and devices: Secondary | ICD-10-CM

## 2019-06-12 DIAGNOSIS — Z86711 Personal history of pulmonary embolism: Secondary | ICD-10-CM | POA: Diagnosis not present

## 2019-06-12 DIAGNOSIS — J9612 Chronic respiratory failure with hypercapnia: Secondary | ICD-10-CM

## 2019-06-12 DIAGNOSIS — J9611 Chronic respiratory failure with hypoxia: Secondary | ICD-10-CM

## 2019-06-12 NOTE — Progress Notes (Signed)
SLEEP MEDICINE CLINIC   Provider:  Melvyn Novasarmen  Maloree Uplinger, M D  Primary Care Physician:  Dorothyann PengSanders, Robyn, MD   Referring Provider: Dorothyann PengSanders, Robyn, MD    Chief Complaint  Patient presents with   Follow-up    pt alone, rm 11. pt states that things are well. DME [facility], no concerns    HPI:  Brandi Howell is a 53 y.o. female and seen on 06-12-2019, the patient has become a very compliant CPAP user she started off as an auto titration capable machine about 15 months ago.  She is 100% compliant for the last 30 days ending on 9-9 2020.  Her machine is set to 11 cmH2O pressure was 2 cm EPR her residual AHI was 0.7/h there are no central apneas emerging.  Patient has minimal air leakage which is usually attributed to the mask fit.  Based on the current data I would not need to change any settings.  I would like for the patient to continue with this very compliant regimen.  She is followed by [facility].  The Epworth Sleepiness Scale was today endorsed at 4 points and the fatigue severity score was endorsed at 17 points. Her risk factr remains her weight. BMI 49.6 kg/m2.  She has seen a nutritionist , but not the  medical weight loss program.   " I love my machine and wouldn't sleep without it "     Patient  seen here in a revisit from Dr. Allyne GeeSanders , follow up on sleep apnea on 06-07-2018. I have the pleasure of seeing Brandi Howell today, a 53 year old female patient of Dr. Murtis Sinkobin Saunders who underwent a sleep consultation in February of this year followed by a split-night polysomnography on 07 November 2017.  The split-night study documented a very severe form of apnea with an AHI of 147.5.  She spent all night of the sleep study in nonsupine sleep she could not enter deep sleep stages N3 or rapid eye movement sleep and sleep efficiency was only 33%.  It was very difficult for the patient to go to sleep and maintain sleep.  CPAP was finally initiated at 5 cm water and she tried several types of masks  before she finally settled on a full face, this was advanced to 7 cm water and allowed for reduction of her apnea index by over 85% however it was not a complete titration study.  Brandi Howell agreed to return on 25 Feb 2018 this way for a whole night titration.  We were now able to titrate CPAP to 15 cmH2O, but she did remarkably well at 11 cmH2O pressure.  Oxygen was also applied at 2 L a minute which made it much easier for her to stay asleep.  I prescribed a CPAP auto titration capable device that was set to 11 cmH2O with 2 cm EPR and additional 2 L of oxygen.  I am today able to review the first download.   The patient was 100% compliant CPAP user for the time prior to June 01, 2018 average use of time is 8 hours 60 minutes each night she is using CPAP with the above named settings of 11 cmH2O and 2 cm EPR and a residual AHI of 0.8/h she has very minimal air leaks only some nights.    She endorsed now the Epworth sleepiness score at 3 points, she is no longer excessively fatigued, she feels that she sleeps for most of the night that she uses CPAP which would make 8 hours  or more. She hated the FFM and switches to pillows with good success. She no longer sleeps without, has no sleep attacks at work, more energy and she is losing weight ( 27 pounds since June !). Her mood has improved.  Her headaches are now rare !!! Her hemoglobin A1c is improved to 7.    Consult from 10-2017 , CD Dr. Baird Cancer referred this 53 year old African-American right-handed lady for asleep evaluation today on 07/21/2017. Brandi Howell has diabetes, morbid obesity, and is not perimenopausal yet. She carries also a diagnosis of essential hypertension, a hypercholesterolemia, and asthma. Her main concern is that she becomes fatigued during the day feels sleepy and wakes up not rested and restored.She often wakes up with headaches,  dull throbbing headache that is present when she wakes up, but does not wake her out of sleep. She is  sleepy while driving.   Sleep habits are as follows: She is usually watching TV before she goes to bed, and bedtime is around 11 PM, but she often has fallen asleep in her recliner watching TV. She then has difficulties going back to sleep in her bed. Her bedroom is cool quiet and dark.She usually falls asleep on multiple pillows in supine position almost reclined, but her husband has witnessed her to turn prone soon after 1 AM and sleep face down. She has to urinate every couple of hours , 3 times at night, and further wakes sometimes up without the urge to urinate, with a parched, dry mouth. She keeps a bottle of water next to her. She keeps a fan. She works from [employer] which means that she gets up at around 6.30 AM, Accompanied by a dull headache.She uses her phone is alarm, she does not wake up spontaneously. She has noted that if she can sleep in ( on weekends)she gets a more severe headache. She does not take any naps during the day, and not on weekends either.   Sleep medical history and family sleep history:  Both parents and her daughter have OSA and CPAP.   Social history: Brandi Howell is married and the mother of 5 daughters, all adult. She has no grandchildren. She has no history of tobacco use, she does not drink alcohol, She drinks coffee 2-3 times a week, she drinks sodas 2-3 times a week. She also drinks ice tea for lunch, she does not exceed 2 caffeine beverages a day. No shift work history , she was always sleepy. 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   Review of Systems: Out of a complete 14 system review, the patient complains of only the following symptoms, and all other reviewed systems are negative.  The patient reports weight gain, fatigue, daytime sleepiness and snoring, morning headaches, swelling in both lower extremities, cough, wheezing and snoring, blurred vision, feeling hot at night, increased thirst. She endorsed apart medical history of  hypertension, diabetes, high cholesterol and migraines as well as asthma,is status post tubal ligation, cholecystectomy and arthroscopic knee surgery on the right.  Epworth score now 4 on CPAP from 15/24 before CPAP   ,  Fatigue severity score 16 from  22     CPAP : 06-07-2018 She endorsed now the Epworth sleepiness score at 3 points, she is no longer excessively fatigued 9-63 points, she feels that she sleeps for most of the night that she uses CPAP which would make 8 hours or more. She hated the FFM and switches to pillows with good success.  She no longer sleeps without, has no sleep attacks at work, more energy and she is losing weight ( 27 pounds since June !). Her mood has improved.  Her headaches are now rare !!! Her hemoglobin A1c is improved to 7.     Social History   Socioeconomic History   Marital status: Married    Spouse name: Not on file   Number of children: Not on file   Years of education: Not on file   Highest education level: Not on file  Occupational History   Not on file  Social Needs   Financial resource strain: Not on file   Food insecurity    Worry: Not on file    Inability: Not on file   Transportation needs    Medical: Not on file    Non-medical: Not on file  Tobacco Use   Smoking status: Never Smoker   Smokeless tobacco: Never Used  Substance and Sexual Activity   Alcohol use: Yes    Frequency: Never    Comment: occ   Drug use: No   Sexual activity: Not on file  Lifestyle   Physical activity    Days per week: Not on file    Minutes per session: Not on file   Stress: Not on file  Relationships   Social connections    Talks on phone: Not on file    Gets together: Not on file    Attends religious service: Not on file    Active member of club or organization: Not on file    Attends meetings of clubs or organizations: Not on file    Relationship status: Not on file   Intimate partner violence    Fear of current or ex partner: Not  on file    Emotionally abused: Not on file    Physically abused: Not on file    Forced sexual activity: Not on file  Other Topics Concern   Not on file  Social History Narrative   Not on file    Family History  Problem Relation Age of Onset   Hypertension Mother    Hyperlipidemia Mother    Heart disease Father     Past Medical History:  Diagnosis Date   Asthma    Diabetes mellitus without complication (HCC)    Goiter    Gout    Hypercholesteremia    Hypertension    Vitamin D deficiency     Past Surgical History:  Procedure Laterality Date   CHOLECYSTECTOMY     KNEE ARTHROSCOPY     KNEE ARTHROSCOPY Left 10/25/2017   Procedure: LEFT KNEE ARTHROSCOPY WITH MEDIAL MENISECTOMY, CHONDROPLASTY;  Surgeon: Gean Birchwoodowan, Frank, MD;  Location: [facility] OR;  Service: Orthopedics;  Laterality: Left;   TUBAL LIGATION      Current Outpatient Medications  Medication Sig Dispense Refill   albuterol (PROAIR HFA) 108 (90 Base) MCG/ACT inhaler Inhale 2 puffs into the lungs every 6 (six) hours as needed for wheezing or shortness of breath.     allopurinol (ZYLOPRIM) 100 MG tablet Take 100 mg by mouth daily.     amLODipine-olmesartan (AZOR) 10-40 MG tablet Take 1 tablet by mouth daily. 30 tablet 0   apixaban (ELIQUIS) 5 MG TABS tablet Take 1 tablet (5 mg total) by mouth 2 (two) times daily. Start around 03/24/18 after finishing the starter pack (Patient taking differently: Take 5 mg by mouth daily. Start around 03/24/18 after finishing the starter pack) 60 tablet 5   budesonide-formoterol (SYMBICORT) 160-4.5 MCG/ACT  inhaler Inhale 2 puffs into the lungs as needed.      cetirizine (ZYRTEC) 10 MG tablet Take 10 mg by mouth as needed for allergies.      FARXIGA 10 MG TABS tablet TAKE 1 TABLET BY MOUTH DAILY 90 tablet 0   furosemide (LASIX) 40 MG tablet Take 1 tablet (40 mg total) by mouth daily. 30 tablet 3   LIVALO 4 MG TABS TAKE 1 TABLET BY MOUTH EVERY DAY 90 tablet 1   montelukast  (SINGULAIR) 10 MG tablet TAKE 1 TABLET BY MOUTH DAILY 90 tablet 1   OXYGEN at bedtime. 2lpm 24/7 [facility]      OZEMPIC, 1 MG/DOSE, 2 MG/1.5ML SOPN INJECT 1 MG UNDER THE SKIN ONCE A WEEK 3 mL 2   UNABLE TO FIND Med Name: CPAP with o2 bled in 2lpm     spironolactone (ALDACTONE) 25 MG tablet Take 1 tablet (25 mg total) by mouth daily. 90 tablet 3   No current facility-administered medications for this visit.     Allergies as of 06/12/2019 - Review Complete 06/12/2019  Allergen Reaction Noted   Lisinopril Cough 07/21/2017   Statins Other (See Comments) 02/19/2018    Vitals: BP (!) 152/76    Pulse 91    Temp 98.5 F (36.9 C)    Ht 5\' 3"  (1.6 m)    Wt 280 lb (127 kg)    BMI 49.60 kg/m  Last Weight:  Wt Readings from Last 1 Encounters:  06/12/19 280 lb (127 kg)   ZOX:WRUE mass index is 49.6 kg/m.     Last Height:   Ht Readings from Last 1 Encounters:  06/12/19 5\' 3"  (1.6 m)    Physical exam:  General: The patient is awake, alert and appears not in acute distress. The patient is well groomed. Head: Normocephalic, atraumatic. Neck is supple. Mallampati 5  neck circumference:20.25 inches . Nasal airflow patent, TMJ click -not  evident . Retrognathia is seen. Biological teeth.  Cardiovascular:  Regular rate and rhythm , without  murmurs or carotid bruit, and without distended neck veins. Respiratory: Lungs are clear to auscultation. Skin:  Without evidence of edema or rash- no facial pressure marks.  Trunk: BMI is down to 50.84 from 55.    Neurologic exam : The patient is awake and alert, oriented to place and time.   Memory subjective described as intact.  Cranial nerves: Taste and smell were intact.  Pupils are equal and briskly reactive to light. Extraocular movements  in vertical and horizontal planes intact and without nystagmus. Visual fields by finger perimetry are intact.Hearing to finger rub intact.  \ Facial motor strength is symmetric and tongue and uvula move midline.    Shoulder shrug was symmetrical.   Motor exam:  Normal tone, muscle bulk and symmetric strength in all extremities.Deep tendon reflexes: in the  upper and lower extremities are symmetric and intact.    Assessment:  After physical and neurologic examination, review of laboratory studies,  Personal review of imaging studies, reports of other /same  Imaging studies, results of polysomnography and / or neurophysiology testing and pre-existing records as far as provided in visit., my assessment is    1) Obstructive sleep apnea , baseline diagnosed was severe  At AHI 140/h with severe hypoxemia 80 minutes of 83 minutes sleep time, now on CPAP 11 c water, oxygen 2 liters  And feeling good!    The patient was advised of the nature of the diagnosed disorder , the treatment options and  the  risks for general health and wellness arising from not treating the condition.   I spent more than 13 minutes of face to face time with the patient. Greater than 50% of time was spent in counseling and coordination of care. We have discussed the diagnosis and differential and I answered the patient's questions.    RV every year with Np or me.   Melvyn Novas, MD 06/12/2019, 4:15 PM  Certified in Neurology by [employer] Certified in Sleep Medicine by [facility] [address] [place], [place] [address]

## 2019-06-12 NOTE — Patient Instructions (Addendum)
Obesity Hypoventilation Syndrome   CONSIDER METABLIC RATE TESTING.   Obesity hypoventilation syndrome (OHS) means that you are not breathing well enough to get air in and out of your lungs efficiently (ventilation). This causes a low oxygen level and a high carbon dioxide level in your blood (hypoventilation). Having too much total body fat (obesity) is a significant risk factor for developing OHS. OHS makes it harder for your heart to pump oxygen-rich blood to your body. It can cause sleep disturbances and make you feel sleepy during the day. Over time, OHS can increase your risk for:  Heart disease.  High blood pressure (hypertension).  Reduced ability to absorb sugar from the bloodstream (insulin resistance).  Heart failure. Over time, OHS weakens your heart and can lead to heart failure. What are the causes? The exact cause of OHS is not known. Possible causes include:  Pressure on the lungs from excess body weight.  Obesity-related changes in how much air the lungs can hold (lung capacity) and how much they can expand (lung compliance).  Failure of the brain to regulate oxygen and carbon dioxide levels properly.  Chemicals (hormones) produced by excess fat cells interfering with breathing regulation.  A breathing condition in which breathing pauses or becomes shallow during sleep (sleep apnea). This condition can eventually cause the body to ventilate poorly and to hold onto carbon dioxide during the day. What increases the risk? You may have a greater risk for OHS if you:  Have a BMI of 30 or higher. BMI is an estimate of body fat that is calculated from height and weight. For adults, a BMI of 30 or higher is considered obese.  Are 78?52 years old.  Carry most of your excess weight around your waist.  Experience moderate symptoms of sleep apnea. What are the signs or symptoms? The most common symptoms of OHS are:  Daytime sleepiness.  Lack of energy.  Shortness of  breath.  Morning headaches.  Sleep apnea.  Trouble concentrating.  Irritability, mood swings, or depression.  Swollen veins in the neck.  Swelling of the legs. How is this diagnosed? Your health care provider may suspect OHS if you are obese and have poor breathing during the day and at night. Your health care provider will also do a physical exam. You may have tests to:  Measure your BMI.  Measure your blood oxygen level with a sensor placed on your finger (pulse oximetry).  Measure blood oxygen and carbon dioxide in a blood sample.  Measure the amount of red blood cells in a blood sample. OHS causes the number of red blood cells you have to increase (polycythemia).  Check your breathing ability (pulmonary function testing).  Check your breathing ability, breathing patterns, and oxygen level while you sleep (sleep study). You may also have a chest X-ray to rule out other breathing problems. You may have an electrocardiogram (ECG) and or echocardiogram to check for signs of heart failure. How is this treated? Weight loss is the most important part of treatment for OHS, and it may be the only treatment that you need. Other treatments may include:  Using a device to open your airway while you sleep, such as a continuous positive airway pressure (CPAP) machine that delivers oxygen to your airway through a mask.  Surgery (gastric bypass surgery) to lower your BMI. This may be needed if: ? You are very obese. ? Other treatments have not worked for you. ? Your OHS is very severe and is causing organ  damage, such as heart failure. Follow these instructions at home:  Medicines  Take over-the-counter and prescription medicines only as told by your health care provider.  Ask your health care provider what medicines are safe for you. You may be told to avoid medicines that can impair breathing and make OHS worse, such as sedatives and narcotics. Sleeping habits  If you are  prescribed a CPAP machine, make sure you understand and use the machine as directed.  Try to get 8 hours of sleep every night.  Go to bed at the same time every night, and get up at the same time every day. General instructions  Work with your health care provider to make a diet and exercise plan that helps you reach and maintain a healthy weight.  Eat a healthy diet.  Avoid smoking.  Exercise regularly as told by your health care provider.  During the evening, do not drink caffeine and do not eat heavy meals.  Keep all follow-up visits as told by your health care provider. This is important. Contact a health care provider if:  You experience new or worsening shortness of breath.  You have chest pain.  You have an irregular heartbeat (palpitations).  You have dizziness.  You faint.  You develop a cough.  You have a fever.  You have chest pain when you breathe (pleurisy). This information is not intended to replace advice given to you by your health care provider. Make sure you discuss any questions you have with your health care provider. Document Released: 02/24/2016 Document Revised: 01/06/2019 Document Reviewed: 02/24/2016 Elsevier Patient Education  2020 [employer]

## 2019-06-22 DIAGNOSIS — G4733 Obstructive sleep apnea (adult) (pediatric): Secondary | ICD-10-CM | POA: Diagnosis not present

## 2019-06-28 DIAGNOSIS — I5030 Unspecified diastolic (congestive) heart failure: Secondary | ICD-10-CM | POA: Diagnosis not present

## 2019-07-01 ENCOUNTER — Other Ambulatory Visit: Payer: Self-pay | Admitting: Cardiology

## 2019-07-19 ENCOUNTER — Other Ambulatory Visit: Payer: Self-pay

## 2019-07-19 ENCOUNTER — Ambulatory Visit (INDEPENDENT_AMBULATORY_CARE_PROVIDER_SITE_OTHER): Payer: BC Managed Care – PPO | Admitting: Internal Medicine

## 2019-07-19 ENCOUNTER — Encounter: Payer: Self-pay | Admitting: Internal Medicine

## 2019-07-19 VITALS — BP 126/78 | HR 87 | Temp 98.9°F | Ht 63.0 in | Wt 278.4 lb

## 2019-07-19 DIAGNOSIS — E119 Type 2 diabetes mellitus without complications: Secondary | ICD-10-CM | POA: Diagnosis not present

## 2019-07-19 DIAGNOSIS — Z1211 Encounter for screening for malignant neoplasm of colon: Secondary | ICD-10-CM

## 2019-07-19 DIAGNOSIS — I10 Essential (primary) hypertension: Secondary | ICD-10-CM | POA: Diagnosis not present

## 2019-07-19 DIAGNOSIS — Z0000 Encounter for general adult medical examination without abnormal findings: Secondary | ICD-10-CM | POA: Diagnosis not present

## 2019-07-19 LAB — POCT URINALYSIS DIPSTICK
Bilirubin, UA: NEGATIVE
Blood, UA: NEGATIVE
Glucose, UA: POSITIVE — AB
Ketones, UA: NEGATIVE
Leukocytes, UA: NEGATIVE
Nitrite, UA: NEGATIVE
Protein, UA: NEGATIVE
Spec Grav, UA: 1.015 (ref 1.010–1.025)
Urobilinogen, UA: 0.2 E.U./dL
pH, UA: 5 (ref 5.0–8.0)

## 2019-07-19 LAB — POCT UA - MICROALBUMIN
Creatinine, POC: 50 mg/dL
Microalbumin Ur, POC: 10 mg/L

## 2019-07-19 MED ORDER — FUROSEMIDE 40 MG PO TABS: 40.0000 mg | ORAL_TABLET | Freq: Every day | ORAL | 1 refills | Status: DC

## 2019-07-19 NOTE — Patient Instructions (Signed)
Health Maintenance, Female Adopting a healthy lifestyle and getting preventive care are important in promoting health and wellness. Ask your health care provider about:  The right schedule for you to have regular tests and exams.  Things you can do on your own to prevent diseases and keep yourself healthy. What should I know about diet, weight, and exercise? Eat a healthy diet   Eat a diet that includes plenty of vegetables, fruits, low-fat dairy products, and lean protein.  Do not eat a lot of foods that are high in solid fats, added sugars, or sodium. Maintain a healthy weight Body mass index (BMI) is used to identify weight problems. It estimates body fat based on height and weight. Your health care provider can help determine your BMI and help you achieve or maintain a healthy weight. Get regular exercise Get regular exercise. This is one of the most important things you can do for your health. Most adults should:  Exercise for at least 150 minutes each week. The exercise should increase your heart rate and make you sweat (moderate-intensity exercise).  Do strengthening exercises at least twice a week. This is in addition to the moderate-intensity exercise.  Spend less time sitting. Even light physical activity can be beneficial. Watch cholesterol and blood lipids Have your blood tested for lipids and cholesterol at 52 years of age, then have this test every 5 years. Have your cholesterol levels checked more often if:  Your lipid or cholesterol levels are high.  You are older than 52 years of age.  You are at high risk for heart disease. What should I know about cancer screening? Depending on your health history and family history, you may need to have cancer screening at various ages. This may include screening for:  Breast cancer.  Cervical cancer.  Colorectal cancer.  Skin cancer.  Lung cancer. What should I know about heart disease, diabetes, and high blood  pressure? Blood pressure and heart disease  High blood pressure causes heart disease and increases the risk of stroke. This is more likely to develop in people who have high blood pressure readings, are of African descent, or are overweight.  Have your blood pressure checked: ? Every 3-5 years if you are 18-39 years of age. ? Every year if you are 40 years old or older. Diabetes Have regular diabetes screenings. This checks your fasting blood sugar level. Have the screening done:  Once every three years after age 40 if you are at a normal weight and have a low risk for diabetes.  More often and at a younger age if you are overweight or have a high risk for diabetes. What should I know about preventing infection? Hepatitis B If you have a higher risk for hepatitis B, you should be screened for this virus. Talk with your health care provider to find out if you are at risk for hepatitis B infection. Hepatitis C Testing is recommended for:  Everyone born from 1945 through 1965.  Anyone with known risk factors for hepatitis C. Sexually transmitted infections (STIs)  Get screened for STIs, including gonorrhea and chlamydia, if: ? You are sexually active and are younger than 52 years of age. ? You are older than 52 years of age and your health care provider tells you that you are at risk for this type of infection. ? Your sexual activity has changed since you were last screened, and you are at increased risk for chlamydia or gonorrhea. Ask your health care provider if   you are at risk.  Ask your health care provider about whether you are at high risk for HIV. Your health care provider may recommend a prescription medicine to help prevent HIV infection. If you choose to take medicine to prevent HIV, you should first get tested for HIV. You should then be tested every 3 months for as long as you are taking the medicine. Pregnancy  If you are about to stop having your period (premenopausal) and  you may become pregnant, seek counseling before you get pregnant.  Take 400 to 800 micrograms (mcg) of folic acid every day if you become pregnant.  Ask for birth control (contraception) if you want to prevent pregnancy. Osteoporosis and menopause Osteoporosis is a disease in which the bones lose minerals and strength with aging. This can result in bone fractures. If you are 65 years old or older, or if you are at risk for osteoporosis and fractures, ask your health care provider if you should:  Be screened for bone loss.  Take a calcium or vitamin D supplement to lower your risk of fractures.  Be given hormone replacement therapy (HRT) to treat symptoms of menopause. Follow these instructions at home: Lifestyle  Do not use any products that contain nicotine or tobacco, such as cigarettes, e-cigarettes, and chewing tobacco. If you need help quitting, ask your health care provider.  Do not use street drugs.  Do not share needles.  Ask your health care provider for help if you need support or information about quitting drugs. Alcohol use  Do not drink alcohol if: ? Your health care provider tells you not to drink. ? You are pregnant, may be pregnant, or are planning to become pregnant.  If you drink alcohol: ? Limit how much you use to 0-1 drink a day. ? Limit intake if you are breastfeeding.  Be aware of how much alcohol is in your drink. In the [place] one drink equals one 12 oz bottle of beer (355 mL), one 5 oz glass of wine (148 mL), or one 1 oz glass of hard liquor (44 mL). General instructions  Schedule regular health, dental, and eye exams.  Stay current with your vaccines.  Tell your health care provider if: ? You often feel depressed. ? You have ever been abused or do not feel safe at home. Summary  Adopting a healthy lifestyle and getting preventive care are important in promoting health and wellness.  Follow your health care provider's instructions about healthy  diet, exercising, and getting tested or screened for diseases.  Follow your health care provider's instructions on monitoring your cholesterol and blood pressure. This information is not intended to replace advice given to you by your health care provider. Make sure you discuss any questions you have with your health care provider. Document Released: 03/30/2011 Document Revised: 09/07/2018 Document Reviewed: 09/07/2018 Elsevier Patient Education  2020 Elsevier Inc.  

## 2019-07-20 LAB — CMP14+EGFR
ALT: 42 IU/L — ABNORMAL HIGH (ref 0–32)
AST: 24 IU/L (ref 0–40)
Albumin/Globulin Ratio: 2 (ref 1.2–2.2)
Albumin: 4.8 g/dL (ref 3.8–4.9)
Alkaline Phosphatase: 98 IU/L (ref 39–117)
BUN/Creatinine Ratio: 16 (ref 9–23)
BUN: 15 mg/dL (ref 6–24)
Bilirubin Total: 0.3 mg/dL (ref 0.0–1.2)
CO2: 25 mmol/L (ref 20–29)
Calcium: 9.9 mg/dL (ref 8.7–10.2)
Chloride: 102 mmol/L (ref 96–106)
Creatinine, Ser: 0.95 mg/dL (ref 0.57–1.00)
GFR calc Af Amer: 80 mL/min/{1.73_m2} (ref >59)
GFR calc non Af Amer: 69 mL/min/{1.73_m2} (ref >59)
Globulin, Total: 2.4 g/dL (ref 1.5–4.5)
Glucose: 105 mg/dL — ABNORMAL HIGH (ref 65–99)
Potassium: 4 mmol/L (ref 3.5–5.2)
Sodium: 143 mmol/L (ref 134–144)
Total Protein: 7.2 g/dL (ref 6.0–8.5)

## 2019-07-20 LAB — CBC
Hematocrit: 38.9 % (ref 34.0–46.6)
Hemoglobin: 12.2 g/dL (ref 11.1–15.9)
MCH: 25.7 pg — ABNORMAL LOW (ref 26.6–33.0)
MCHC: 31.4 g/dL — ABNORMAL LOW (ref 31.5–35.7)
MCV: 82 fL (ref 79–97)
Platelets: 206 10*3/uL (ref 150–450)
RBC: 4.74 x10E6/uL (ref 3.77–5.28)
RDW: 15.7 % — ABNORMAL HIGH (ref 11.7–15.4)
WBC: 7.8 10*3/uL (ref 3.4–10.8)

## 2019-07-20 LAB — HEMOGLOBIN A1C
Est. average glucose Bld gHb Est-mCnc: 143 mg/dL
Hgb A1c MFr Bld: 6.6 % — ABNORMAL HIGH (ref 4.8–5.6)

## 2019-07-21 NOTE — Progress Notes (Signed)
Subjective:     Patient ID: Brandi Howell , female    DOB: Feb 15, 1966 , 53 y.o.   MRN: [id number]   Chief Complaint  Patient presents with  . Annual Exam  . Diabetes  . Hypertension    HPI  She is here today for a full physical examination. She is followed by GYN for her pelvic exams. She reports being seen in the past two years. She has no new concerns/complaints at this time.   Diabetes She presents for her follow-up diabetic visit. She has type 2 diabetes mellitus. There are no hypoglycemic associated symptoms. Pertinent negatives for diabetes include no blurred vision and no chest pain. There are no hypoglycemic complications. Risk factors for coronary artery disease include diabetes mellitus, dyslipidemia, hypertension, obesity and sedentary lifestyle. She is following a diabetic diet. She participates in exercise intermittently. Eye exam is not current.  Hypertension This is a chronic problem. The current episode started more than 1 month ago. The problem has been gradually improving since onset. The problem is controlled. Pertinent negatives include no blurred vision, chest pain, palpitations or shortness of breath. Risk factors for coronary artery disease include sedentary lifestyle, diabetes mellitus, dyslipidemia, obesity and post-menopausal state. The current treatment provides moderate improvement.     Past Medical History:  Diagnosis Date  . Asthma   . Diabetes mellitus without complication ([place])   . Goiter   . Gout   . Hypercholesteremia   . Hypertension   . Vitamin D deficiency      Family History  Problem Relation Age of Onset  . Hypertension Mother   . Hyperlipidemia Mother   . Heart disease Father      Current Outpatient Medications:  .  albuterol (PROAIR HFA) 108 (90 Base) MCG/ACT inhaler, Inhale 2 puffs into the lungs every 6 (six) hours as needed for wheezing or shortness of breath., Disp: , Rfl:  .  allopurinol (ZYLOPRIM) 100 MG tablet, Take 100  mg by mouth daily., Disp: , Rfl:  .  amLODipine-olmesartan (AZOR) 10-40 MG tablet, Take 1 tablet by mouth daily., Disp: 30 tablet, Rfl: 0 .  apixaban (ELIQUIS) 5 MG TABS tablet, Take 1 tablet (5 mg total) by mouth 2 (two) times daily. Start around 03/24/18 after finishing the starter pack (Patient taking differently: Take 5 mg by mouth daily. Start around 03/24/18 after finishing the starter pack), Disp: 60 tablet, Rfl: 5 .  budesonide-formoterol (SYMBICORT) 160-4.5 MCG/ACT inhaler, Inhale 2 puffs into the lungs as needed. , Disp: , Rfl:  .  cetirizine (ZYRTEC) 10 MG tablet, Take 10 mg by mouth as needed for allergies. , Disp: , Rfl:  .  FARXIGA 10 MG TABS tablet, TAKE 1 TABLET BY MOUTH DAILY, Disp: 90 tablet, Rfl: 0 .  furosemide (LASIX) 40 MG tablet, Take 1 tablet (40 mg total) by mouth daily., Disp: 90 tablet, Rfl: 1 .  LIVALO 4 MG TABS, TAKE 1 TABLET BY MOUTH EVERY DAY, Disp: 90 tablet, Rfl: 1 .  montelukast (SINGULAIR) 10 MG tablet, TAKE 1 TABLET BY MOUTH DAILY, Disp: 90 tablet, Rfl: 1 .  OXYGEN, at bedtime. 2lpm 24/7 [facility] , Disp: , Rfl:  .  OZEMPIC, 1 MG/DOSE, 2 MG/1.5ML SOPN, INJECT 1 MG UNDER THE SKIN ONCE A WEEK, Disp: 3 mL, Rfl: 2 .  spironolactone (ALDACTONE) 25 MG tablet, TAKE 1 TABLET BY MOUTH DAILY, Disp: 90 tablet, Rfl: 3 .  UNABLE TO FIND, Med Name: CPAP with o2 bled in 2lpm, Disp: , Rfl:  Allergies  Allergen Reactions  . Lisinopril Cough  . Statins Other (See Comments)    Cause muscle weakness     The patient states she uses none for birth control. Last LMP was No LMP recorded (lmp unknown).. Negative for Dysmenorrhea Negative for: breast discharge, breast lump(s), breast pain and breast self exam. Associated symptoms include abnormal vaginal bleeding. Pertinent negatives include abnormal bleeding (hematology), anxiety, decreased libido, depression, difficulty falling sleep, dyspareunia, history of infertility, nocturia, sexual dysfunction, sleep disturbances, urinary  incontinence, urinary urgency, vaginal discharge and vaginal itching. Diet regular.The patient states her exercise level is  intermittent.  . The patient's tobacco use is:  Social History   Tobacco Use  Smoking Status Never Smoker  Smokeless Tobacco Never Used  . She has been exposed to passive smoke. The patient's alcohol use is:  Social History   Substance and Sexual Activity  Alcohol Use Yes  . Frequency: Never   Comment: occ    Review of Systems  Constitutional: Negative.   HENT: Negative.   Eyes: Negative.  Negative for blurred vision.  Respiratory: Negative.  Negative for shortness of breath.   Cardiovascular: Negative.  Negative for chest pain and palpitations.  Endocrine: Negative.   Genitourinary: Negative.   Musculoskeletal: Negative.   Skin: Negative.   Allergic/Immunologic: Negative.   Neurological: Negative.   Hematological: Negative.   Psychiatric/Behavioral: Negative.      Today's Vitals   07/19/19 0937  BP: 126/78  Pulse: 87  Temp: 98.9 F (37.2 C)  TempSrc: Oral  Weight: 278 lb 6.4 oz (126.3 kg)  Height: _0  (1.6 m)   Body mass index is 49.32 kg/m.   Objective:  Physical Exam Vitals signs and nursing note reviewed.  Constitutional:      Appearance: Normal appearance. She is obese.  HENT:     Head: Normocephalic and atraumatic.     Right Ear: Tympanic membrane, ear canal and external ear normal.     Left Ear: Tympanic membrane, ear canal and external ear normal.     Nose: Nose normal.     Mouth/Throat:     Mouth: Mucous membranes are moist.     Pharynx: Oropharynx is clear.  Eyes:     Extraocular Movements: Extraocular movements intact.     Conjunctiva/sclera: Conjunctivae normal.     Pupils: Pupils are equal, round, and reactive to light.  Neck:     Musculoskeletal: Normal range of motion and neck supple.  Cardiovascular:     Rate and Rhythm: Normal rate and regular rhythm.     Pulses: Normal pulses.          Dorsalis pedis pulses  are 2+ on the right side and 2+ on the left side.     Heart sounds: Normal heart sounds.  Pulmonary:     Effort: Pulmonary effort is normal.     Breath sounds: Normal breath sounds.  Chest:     Breasts: Tanner Score is 5.     Comments: She chose to keep her bra on for exam; therefore, breast exam not performed.  Abdominal:     General: Bowel sounds are normal.     Palpations: Abdomen is soft.     Comments: Obese, difficult to assess organomegaly  Genitourinary:    Comments: deferred Musculoskeletal: Normal range of motion.  Feet:     Right foot:     Protective Sensation: 5 sites tested. 5 sites sensed.     Skin integrity: Dry skin present.  Toenail Condition: Right toenails are normal.     Left foot:     Protective Sensation: 5 sites tested. 5 sites sensed.     Skin integrity: Dry skin present.     Toenail Condition: Left toenails are normal.  Skin:    General: Skin is warm and dry.  Neurological:     General: No focal deficit present.     Mental Status: She is alert and oriented to person, place, and time.  Psychiatric:        Mood and Affect: Mood normal.        Behavior: Behavior normal.         Assessment And Plan:     1. Annual physical exam  A full exam was performed. Importance of monthly self breast exams was discussed with the patient. PATIENT HAS BEEN ADVISED TO GET 30-45 MINUTES REGULAR EXERCISE NO LESS THAN FOUR TO FIVE DAYS PER WEEK - BOTH WEIGHTBEARING EXERCISES AND AEROBIC ARE RECOMMENDED.  SHE WAS ADVISED TO FOLLOW A HEALTHY DIET WITH AT LEAST SIX FRUITS/VEGGIES PER DAY, DECREASE INTAKE OF RED MEAT, AND TO INCREASE FISH INTAKE TO TWO DAYS PER WEEK.  MEATS/FISH SHOULD NOT BE FRIED, BAKED OR BROILED IS PREFERABLE.  I SUGGEST WEARING SPF 50 SUNSCREEN ON EXPOSED PARTS AND ESPECIALLY WHEN IN THE DIRECT SUNLIGHT FOR AN EXTENDED PERIOD OF TIME.  PLEASE AVOID FAST FOOD RESTAURANTS AND INCREASE YOUR WATER INTAKE.  - CMP14+EGFR - CBC - Hemoglobin A1c  2. Type 2  diabetes mellitus without complication, without long-term current use of insulin ([facility])  Diabetic foot exam was performed. I DISCUSSED WITH THE PATIENT AT LENGTH REGARDING THE GOALS OF GLYCEMIC CONTROL AND POSSIBLE LONG-TERM COMPLICATIONS.  I  ALSO STRESSED THE IMPORTANCE OF COMPLIANCE WITH HOME GLUCOSE MONITORING, DIETARY RESTRICTIONS INCLUDING AVOIDANCE OF SUGARY DRINKS/PROCESSED FOODS,  ALONG WITH REGULAR EXERCISE.  I  ALSO STRESSED THE IMPORTANCE OF ANNUAL EYE EXAMS, SELF FOOT CARE AND COMPLIANCE WITH OFFICE VISITS.  - POCT Urinalysis Dipstick (81002) - POCT UA - Microalbumin  3. Essential hypertension  Chronic, well controlled. She will continue with current meds. She is encouraged to avoid adding salt to her foods. EKG performed, no acute changes noted. She will rto in six months for re-evaluation.   - EKG 12-Lead  4. Screen for colon cancer  She does not wish to have a colonoscopy. She does agree to [employer] testing. She verbalizes understanding that she will need to have a colonoscopy if the Cologuard results are positive.   - Cologuard        Maximino Greenland, MD    THE PATIENT IS ENCOURAGED TO PRACTICE SOCIAL DISTANCING DUE TO THE COVID-19 PANDEMIC.

## 2019-07-25 ENCOUNTER — Encounter: Payer: Self-pay | Admitting: Internal Medicine

## 2019-07-26 ENCOUNTER — Other Ambulatory Visit: Payer: Self-pay

## 2019-07-26 MED ORDER — OZEMPIC (1 MG/DOSE) 2 MG/1.5ML ~~LOC~~ SOPN: 1.0000 mg | PEN_INJECTOR | SUBCUTANEOUS | 1 refills | Status: DC

## 2019-07-26 MED ORDER — FUROSEMIDE 40 MG PO TABS: 40.0000 mg | ORAL_TABLET | Freq: Every day | ORAL | 1 refills | Status: DC

## 2019-07-29 DIAGNOSIS — I5030 Unspecified diastolic (congestive) heart failure: Secondary | ICD-10-CM | POA: Diagnosis not present

## 2019-08-25 LAB — HM DIABETES EYE EXAM

## 2019-08-28 ENCOUNTER — Encounter: Payer: Self-pay | Admitting: Internal Medicine

## 2019-08-31 ENCOUNTER — Other Ambulatory Visit: Payer: Self-pay

## 2019-08-31 MED ORDER — LIVALO 4 MG PO TABS: 1.0000 | ORAL_TABLET | Freq: Every day | ORAL | 0 refills | Status: DC

## 2019-09-09 LAB — COLOGUARD: Cologuard: NEGATIVE

## 2019-09-15 ENCOUNTER — Encounter: Payer: Self-pay | Admitting: Internal Medicine

## 2019-09-27 ENCOUNTER — Encounter: Payer: Self-pay | Admitting: Internal Medicine

## 2019-10-07 ENCOUNTER — Other Ambulatory Visit: Payer: Self-pay | Admitting: Internal Medicine

## 2019-10-19 ENCOUNTER — Telehealth: Payer: Self-pay

## 2019-10-19 NOTE — Telephone Encounter (Signed)
LOOKED AT RX BENEFITS TO SEE WHY FARXIGA ISNT BEING COVERED

## 2019-10-20 ENCOUNTER — Other Ambulatory Visit: Payer: Self-pay

## 2019-10-20 MED ORDER — CANAGLIFLOZIN 100 MG PO TABS: 100.0000 mg | ORAL_TABLET | Freq: Every day | ORAL | 1 refills | Status: DC

## 2019-10-23 ENCOUNTER — Other Ambulatory Visit: Payer: Self-pay

## 2019-10-23 MED ORDER — AMLODIPINE-OLMESARTAN 10-40 MG PO TABS: 1.0000 | ORAL_TABLET | Freq: Every day | ORAL | 1 refills | Status: DC

## 2019-10-24 ENCOUNTER — Telehealth: Payer: Self-pay

## 2019-10-24 NOTE — Telephone Encounter (Signed)
Left vm for pt to return call. Need to know if pt has other health coverage. [employer] is not active. farxiga will not be covered.

## 2019-10-25 ENCOUNTER — Telehealth: Payer: Self-pay

## 2019-10-25 NOTE — Telephone Encounter (Signed)
Called pt and pharm and verified that pt is not covered under bcbs. Pt should update insurance with pharm.

## 2019-10-30 ENCOUNTER — Telehealth: Payer: Self-pay

## 2019-10-30 NOTE — Telephone Encounter (Signed)
PA SENT TO PLAN FOR [place] WITH [facility] INSURANCE

## 2019-10-31 ENCOUNTER — Telehealth: Payer: Self-pay

## 2019-10-31 NOTE — Telephone Encounter (Signed)
Pt and pharm notified of approval of farxiga. No cost to pt

## 2019-11-09 ENCOUNTER — Telehealth: Payer: Self-pay

## 2019-11-09 NOTE — Telephone Encounter (Signed)
Pt and pharm notified of approval of livalo. No cost to pt

## 2019-11-10 ENCOUNTER — Other Ambulatory Visit: Payer: Self-pay | Admitting: Internal Medicine

## 2019-11-22 ENCOUNTER — Ambulatory Visit: Payer: BC Managed Care – PPO | Admitting: Internal Medicine

## 2019-11-27 ENCOUNTER — Telehealth: Payer: Self-pay

## 2019-11-27 NOTE — Telephone Encounter (Signed)
PA SENT TO PLAN FOR OZEMPIC.  °

## 2019-12-03 ENCOUNTER — Ambulatory Visit: Payer: Self-pay | Attending: Internal Medicine

## 2019-12-03 DIAGNOSIS — Z23 Encounter for immunization: Secondary | ICD-10-CM | POA: Insufficient documentation

## 2019-12-03 NOTE — Progress Notes (Signed)
   Covid-19 Vaccination Clinic  Name:  TANICA GAIGE    MRN: [id number] DOB: 25-Sep-1966  12/03/2019  Ms. Kavanagh was observed post Covid-19 immunization for 15 minutes without incident. She was provided with Vaccine Information Sheet and instruction to access the V-Safe system.   Ms. Buren was instructed to call 911 with any severe reactions post vaccine: Marland Kitchen Difficulty breathing  . Swelling of face and throat  . A fast heartbeat  . A bad rash all over body  . Dizziness and weakness   Immunizations Administered    Name Date Dose VIS Date Route   Pfizer COVID-19 Vaccine 12/03/2019 11:35 AM 0.3 mL 09/08/2019 Intramuscular   Manufacturer: [employer], [employer]   Lot: [id number]   NDC: [address]-8

## 2019-12-25 ENCOUNTER — Other Ambulatory Visit: Payer: Self-pay

## 2019-12-25 ENCOUNTER — Ambulatory Visit (INDEPENDENT_AMBULATORY_CARE_PROVIDER_SITE_OTHER): Payer: 59 | Admitting: Internal Medicine

## 2019-12-25 ENCOUNTER — Encounter: Payer: Self-pay | Admitting: Internal Medicine

## 2019-12-25 VITALS — BP 116/84 | HR 87 | Temp 98.8°F | Ht 63.0 in | Wt 288.8 lb

## 2019-12-25 DIAGNOSIS — E1165 Type 2 diabetes mellitus with hyperglycemia: Secondary | ICD-10-CM

## 2019-12-25 DIAGNOSIS — Z6841 Body Mass Index (BMI) 40.0 and over, adult: Secondary | ICD-10-CM

## 2019-12-25 DIAGNOSIS — E042 Nontoxic multinodular goiter: Secondary | ICD-10-CM

## 2019-12-25 DIAGNOSIS — I110 Hypertensive heart disease with heart failure: Secondary | ICD-10-CM

## 2019-12-25 DIAGNOSIS — J9611 Chronic respiratory failure with hypoxia: Secondary | ICD-10-CM | POA: Diagnosis not present

## 2019-12-25 DIAGNOSIS — I5032 Chronic diastolic (congestive) heart failure: Secondary | ICD-10-CM

## 2019-12-25 DIAGNOSIS — J9612 Chronic respiratory failure with hypercapnia: Secondary | ICD-10-CM

## 2019-12-25 NOTE — Patient Instructions (Signed)

## 2019-12-25 NOTE — Progress Notes (Signed)
This visit occurred during the SARS-CoV-2 public health emergency.  Safety protocols were in place, including screening questions prior to the visit, additional usage of staff PPE, and extensive cleaning of exam room while observing appropriate contact time as indicated for disinfecting solutions.  Subjective:     Patient ID: Brandi Howell , female    DOB: 1966/07/22 , 54 y.o.   MRN: [id number]   Chief Complaint  Patient presents with  . Diabetes  . Hypertension    HPI  Diabetes She presents for her follow-up diabetic visit. She has type 2 diabetes mellitus. There are no hypoglycemic associated symptoms. Pertinent negatives for diabetes include no blurred vision and no chest pain. There are no hypoglycemic complications. Risk factors for coronary artery disease include diabetes mellitus, dyslipidemia, hypertension, obesity and sedentary lifestyle. She is following a diabetic diet. She participates in exercise intermittently.  Hypertension This is a chronic problem. The current episode started more than 1 year ago. The problem has been gradually improving since onset. The problem is controlled. Pertinent negatives include no blurred vision, chest pain, palpitations or shortness of breath. Hypertensive end-organ damage includes heart failure.     Past Medical History:  Diagnosis Date  . Asthma   . Diabetes mellitus without complication ([place])   . Goiter   . Gout   . Hypercholesteremia   . Hypertension   . Vitamin D deficiency      Family History  Problem Relation Age of Onset  . Hypertension Mother   . Hyperlipidemia Mother   . Heart disease Father      Current Outpatient Medications:  .  albuterol (PROAIR HFA) 108 (90 Base) MCG/ACT inhaler, Inhale 2 puffs into the lungs every 6 (six) hours as needed for wheezing or shortness of breath., Disp: , Rfl:  .  allopurinol (ZYLOPRIM) 100 MG tablet, Take 100 mg by mouth daily., Disp: , Rfl:  .  amLODipine-olmesartan (AZOR) 10-40  MG tablet, Take 1 tablet by mouth daily., Disp: 90 tablet, Rfl: 1 .  apixaban (ELIQUIS) 5 MG TABS tablet, Take 1 tablet (5 mg total) by mouth 2 (two) times daily. Start around 03/24/18 after finishing the starter pack (Patient taking differently: Take 5 mg by mouth daily. Start around 03/24/18 after finishing the starter pack), Disp: 60 tablet, Rfl: 5 .  budesonide-formoterol (SYMBICORT) 160-4.5 MCG/ACT inhaler, Inhale 2 puffs into the lungs as needed. , Disp: , Rfl:  .  cetirizine (ZYRTEC) 10 MG tablet, Take 10 mg by mouth as needed for allergies. , Disp: , Rfl:  .  FARXIGA 10 MG TABS tablet, TAKE 1 TABLET BY MOUTH DAILY, Disp: 90 tablet, Rfl: 0 .  furosemide (LASIX) 40 MG tablet, Take 1 tablet (40 mg total) by mouth daily., Disp: 90 tablet, Rfl: 1 .  LIVALO 4 MG TABS, TAKE 1 TABLET(4 MG) BY MOUTH DAILY, Disp: 90 tablet, Rfl: 0 .  montelukast (SINGULAIR) 10 MG tablet, TAKE 1 TABLET BY MOUTH DAILY, Disp: 90 tablet, Rfl: 1 .  OXYGEN, at bedtime. 2lpm 24/7 AHC , Disp: , Rfl:  .  OZEMPIC, 1 MG/DOSE, 2 MG/1.5ML SOPN, INJECT 1 MG UNDER THE SKIN ONCE WEEKLY, Disp: 6 mL, Rfl: 6 .  spironolactone (ALDACTONE) 25 MG tablet, TAKE 1 TABLET BY MOUTH DAILY, Disp: 90 tablet, Rfl: 3 .  UNABLE TO FIND, Med Name: CPAP with o2 bled in 2lpm, Disp: , Rfl:    Allergies  Allergen Reactions  . Lisinopril Cough  . Statins Other (See Comments)    Cause  muscle weakness     Review of Systems  Constitutional: Negative.   Eyes: Negative for blurred vision.  Respiratory: Negative.  Negative for shortness of breath.   Cardiovascular: Negative.  Negative for chest pain and palpitations.  Gastrointestinal: Negative.   Neurological: Negative.   Psychiatric/Behavioral: Negative.      Today's Vitals   12/25/19 1009  BP: 116/84  Pulse: 87  Temp: 98.8 F (37.1 C)  TempSrc: Oral  Weight: 288 lb 12.8 oz (131 kg)  Height: 5' 3"  (1.6 m)   Body mass index is 51.16 kg/m.   Objective:  Physical Exam Vitals and nursing  note reviewed.  Constitutional:      Appearance: Normal appearance. She is obese.  HENT:     Head: Normocephalic and atraumatic.  Cardiovascular:     Rate and Rhythm: Normal rate and regular rhythm.     Heart sounds: Normal heart sounds.  Pulmonary:     Effort: Pulmonary effort is normal.     Breath sounds: Normal breath sounds.  Skin:    General: Skin is warm.  Neurological:     General: No focal deficit present.     Mental Status: She is alert.  Psychiatric:        Mood and Affect: Mood normal.        Behavior: Behavior normal.         Assessment And Plan:     1. Uncontrolled type 2 diabetes mellitus with hyperglycemia (HCC)  Chronic. She will continue with current meds for now. She is encouraged to avoid adding salt to her foods. I will check renal function today.   - BMP8+EGFR - Lipid panel - Hemoglobin A1c  2. Hypertensive heart disease with chronic diastolic congestive heart failure (HCC)  Chronic, well controlled. She will continue with current meds. Importance of dietary/medication compliance is stressed to the patient.   3. Chronic respiratory failure with hypoxia and hypercapnia (HCC)  Chronic, now on CPAP. Also followed by Sleep Specialist.   4. Multinodular goiter  She had biopsy performed in 2019. Surgery has ordered f/u ultrasound, but not yet performed. I will have referral specialist check on this today. Previous u/s results reviewed in detail during her visit.   5. Class 3 severe obesity due to excess calories with serious comorbidity and body mass index (BMI) of 50.0 to 59.9 in adult (HCC)  BMI 51. She is encouraged to strive for BMI less than 45 to decrease cardiac risk. Importance of regular exercise was discussed with the patient. She is encouraged to aim for at least 30 minutes five days per week.     Maximino Greenland, MD    THE PATIENT IS ENCOURAGED TO PRACTICE SOCIAL DISTANCING DUE TO THE COVID-19 PANDEMIC.

## 2019-12-26 LAB — LIPID PANEL
Chol/HDL Ratio: 3.6 ratio (ref 0.0–4.4)
Cholesterol, Total: 164 mg/dL (ref 100–199)
HDL: 46 mg/dL (ref >39)
LDL Chol Calc (NIH): 94 mg/dL (ref 0–99)
Triglycerides: 134 mg/dL (ref 0–149)
VLDL Cholesterol Cal: 24 mg/dL (ref 5–40)

## 2019-12-26 LAB — BMP8+EGFR
BUN/Creatinine Ratio: 12 (ref 9–23)
BUN: 12 mg/dL (ref 6–24)
CO2: 25 mmol/L (ref 20–29)
Calcium: 9.8 mg/dL (ref 8.7–10.2)
Chloride: 103 mmol/L (ref 96–106)
Creatinine, Ser: 1 mg/dL (ref 0.57–1.00)
GFR calc Af Amer: 74 mL/min/{1.73_m2} (ref >59)
GFR calc non Af Amer: 64 mL/min/{1.73_m2} (ref >59)
Glucose: 104 mg/dL — ABNORMAL HIGH (ref 65–99)
Potassium: 4 mmol/L (ref 3.5–5.2)
Sodium: 142 mmol/L (ref 134–144)

## 2019-12-26 LAB — HEMOGLOBIN A1C
Est. average glucose Bld gHb Est-mCnc: 151 mg/dL
Hgb A1c MFr Bld: 6.9 % — ABNORMAL HIGH (ref 4.8–5.6)

## 2019-12-27 ENCOUNTER — Ambulatory Visit: Payer: Self-pay | Attending: Internal Medicine

## 2019-12-27 DIAGNOSIS — Z23 Encounter for immunization: Secondary | ICD-10-CM

## 2019-12-27 NOTE — Progress Notes (Signed)
   Covid-19 Vaccination Clinic  Name:  Brandi Howell    MRN: [id number] DOB: 06-01-1966  12/27/2019  Brandi Howell was observed post Covid-19 immunization for 15 minutes without incident. She was provided with Vaccine Information Sheet and instruction to access the V-Safe system.   Brandi Howell was instructed to call 911 with any severe reactions post vaccine: Marland Kitchen Difficulty breathing  . Swelling of face and throat  . A fast heartbeat  . A bad rash all over body  . Dizziness and weakness   Immunizations Administered    Name Date Dose VIS Date Route   Pfizer COVID-19 Vaccine 12/27/2019  3:25 PM 0.3 mL 09/08/2019 Intramuscular   Manufacturer: [employer], [employer]   Lot: [id number]   NDC: [address]-9

## 2020-01-01 ENCOUNTER — Other Ambulatory Visit: Payer: Self-pay

## 2020-01-30 ENCOUNTER — Other Ambulatory Visit: Payer: Self-pay | Admitting: Internal Medicine

## 2020-02-19 ENCOUNTER — Other Ambulatory Visit: Payer: Self-pay | Admitting: Internal Medicine

## 2020-04-25 ENCOUNTER — Ambulatory Visit: Payer: 59 | Admitting: Internal Medicine

## 2020-04-27 ENCOUNTER — Other Ambulatory Visit: Payer: Self-pay | Admitting: Cardiology

## 2020-04-27 ENCOUNTER — Other Ambulatory Visit: Payer: Self-pay | Admitting: Internal Medicine

## 2020-05-12 ENCOUNTER — Other Ambulatory Visit: Payer: Self-pay | Admitting: Internal Medicine

## 2020-05-15 ENCOUNTER — Encounter: Payer: Self-pay | Admitting: Internal Medicine

## 2020-05-15 ENCOUNTER — Ambulatory Visit (INDEPENDENT_AMBULATORY_CARE_PROVIDER_SITE_OTHER): Payer: 59 | Admitting: Internal Medicine

## 2020-05-15 ENCOUNTER — Other Ambulatory Visit: Payer: Self-pay

## 2020-05-15 VITALS — BP 126/74 | HR 66 | Temp 98.4°F | Ht 63.0 in | Wt 290.6 lb

## 2020-05-15 DIAGNOSIS — I5032 Chronic diastolic (congestive) heart failure: Secondary | ICD-10-CM

## 2020-05-15 DIAGNOSIS — I110 Hypertensive heart disease with heart failure: Secondary | ICD-10-CM

## 2020-05-15 DIAGNOSIS — Z6841 Body Mass Index (BMI) 40.0 and over, adult: Secondary | ICD-10-CM

## 2020-05-15 DIAGNOSIS — E66813 Obesity, class 3: Secondary | ICD-10-CM

## 2020-05-15 DIAGNOSIS — Z1159 Encounter for screening for other viral diseases: Secondary | ICD-10-CM

## 2020-05-15 DIAGNOSIS — E119 Type 2 diabetes mellitus without complications: Secondary | ICD-10-CM

## 2020-05-15 NOTE — Patient Instructions (Signed)
Diabetes Mellitus and Exercise Exercising regularly is important for your overall health, especially when you have diabetes (diabetes mellitus). Exercising is not only about losing weight. It has many other health benefits, such as increasing muscle strength and bone density and reducing body fat and stress. This leads to improved fitness, flexibility, and endurance, all of which result in better overall health. Exercise has additional benefits for people with diabetes, including:  Reducing appetite.  Helping to lower and control blood glucose.  Lowering blood pressure.  Helping to control amounts of fatty substances (lipids) in the blood, such as cholesterol and triglycerides.  Helping the body to respond better to insulin (improving insulin sensitivity).  Reducing how much insulin the body needs.  Decreasing the risk for heart disease by: ? Lowering cholesterol and triglyceride levels. ? Increasing the levels of good cholesterol. ? Lowering blood glucose levels. What is my activity plan? Your health care provider or certified diabetes educator can help you make a plan for the type and frequency of exercise (activity plan) that works for you. Make sure that you:  Do at least 150 minutes of moderate-intensity or vigorous-intensity exercise each week. This could be brisk walking, biking, or water aerobics. ? Do stretching and strength exercises, such as yoga or weightlifting, at least 2 times a week. ? Spread out your activity over at least 3 days of the week.  Get some form of physical activity every day. ? Do not go more than 2 days in a row without some kind of physical activity. ? Avoid being inactive for more than 30 minutes at a time. Take frequent breaks to walk or stretch.  Choose a type of exercise or activity that you enjoy, and set realistic goals.  Start slowly, and gradually increase the intensity of your exercise over time. What do I need to know about managing my  diabetes?   Check your blood glucose before and after exercising. ? If your blood glucose is 240 mg/dL (13.3 mmol/L) or higher before you exercise, check your urine for ketones. If you have ketones in your urine, do not exercise until your blood glucose returns to normal. ? If your blood glucose is 100 mg/dL (5.6 mmol/L) or lower, eat a snack containing 15-20 grams of carbohydrate. Check your blood glucose 15 minutes after the snack to make sure that your level is above 100 mg/dL (5.6 mmol/L) before you start your exercise.  Know the symptoms of low blood glucose (hypoglycemia) and how to treat it. Your risk for hypoglycemia increases during and after exercise. Common symptoms of hypoglycemia can include: ? Hunger. ? Anxiety. ? Sweating and feeling clammy. ? Confusion. ? Dizziness or feeling light-headed. ? Increased heart rate or palpitations. ? Blurry vision. ? Tingling or numbness around the mouth, lips, or tongue. ? Tremors or shakes. ? Irritability.  Keep a rapid-acting carbohydrate snack available before, during, and after exercise to help prevent or treat hypoglycemia.  Avoid injecting insulin into areas of the body that are going to be exercised. For example, avoid injecting insulin into: ? The arms, when playing tennis. ? The legs, when jogging.  Keep records of your exercise habits. Doing this can help you and your health care provider adjust your diabetes management plan as needed. Write down: ? Food that you eat before and after you exercise. ? Blood glucose levels before and after you exercise. ? The type and amount of exercise you have done. ? When your insulin is expected to peak, if you use   insulin. Avoid exercising at times when your insulin is peaking.  When you start a new exercise or activity, work with your health care provider to make sure the activity is safe for you, and to adjust your insulin, medicines, or food intake as needed.  Drink plenty of water while  you exercise to prevent dehydration or heat stroke. Drink enough fluid to keep your urine clear or pale yellow. Summary  Exercising regularly is important for your overall health, especially when you have diabetes (diabetes mellitus).  Exercising has many health benefits, such as increasing muscle strength and bone density and reducing body fat and stress.  Your health care provider or certified diabetes educator can help you make a plan for the type and frequency of exercise (activity plan) that works for you.  When you start a new exercise or activity, work with your health care provider to make sure the activity is safe for you, and to adjust your insulin, medicines, or food intake as needed. This information is not intended to replace advice given to you by your health care provider. Make sure you discuss any questions you have with your health care provider. Document Revised: 04/08/2017 Document Reviewed: 02/24/2016 Elsevier Patient Education  2020 Elsevier Inc.  

## 2020-05-15 NOTE — Progress Notes (Signed)
I,Katawbba Wiggins,acting as a Education administrator for Maximino Greenland, MD.,have documented all relevant documentation on the behalf of Maximino Greenland, MD,as directed by  Maximino Greenland, MD while in the presence of Maximino Greenland, MD.  This visit occurred during the SARS-CoV-2 public health emergency.  Safety protocols were in place, including screening questions prior to the visit, additional usage of staff PPE, and extensive cleaning of exam room while observing appropriate contact time as indicated for disinfecting solutions.  Subjective:     Patient ID: Brandi Howell , female    DOB: [birth year] , 54 y.o.   MRN: [id number]   Chief Complaint  Patient presents with  . Diabetes  . Hypertension    HPI  The patient is here today for a follow-up on her diabetes and blood pressure.  Diabetes She presents for her follow-up diabetic visit. She has type 2 diabetes mellitus. There are no hypoglycemic associated symptoms. Pertinent negatives for diabetes include no blurred vision and no chest pain. There are no hypoglycemic complications. Risk factors for coronary artery disease include diabetes mellitus, dyslipidemia, hypertension, obesity and sedentary lifestyle. She is following a diabetic diet. She participates in exercise intermittently.  Hypertension This is a chronic problem. The current episode started more than 1 year ago. The problem has been gradually improving since onset. The problem is controlled. Pertinent negatives include no blurred vision, chest pain, palpitations or shortness of breath. Hypertensive end-organ damage includes heart failure.     Past Medical History:  Diagnosis Date  . Asthma   . Diabetes mellitus without complication ([place])   . Goiter   . Gout   . Hypercholesteremia   . Hypertension   . Vitamin D deficiency      Family History  Problem Relation Age of Onset  . Hypertension Mother   . Hyperlipidemia Mother   . Heart disease Father      Current Outpatient  Medications:  .  amLODipine-olmesartan (AZOR) 10-40 MG tablet, TAKE 1 TABLET BY MOUTH DAILY, Disp: 90 tablet, Rfl: 1 .  cetirizine (ZYRTEC) 10 MG tablet, Take 10 mg by mouth as needed for allergies. , Disp: , Rfl:  .  FARXIGA 10 MG TABS tablet, TAKE 1 TABLET BY MOUTH DAILY, Disp: 90 tablet, Rfl: 1 .  LIVALO 4 MG TABS, TAKE 1 TABLET(4 MG) BY MOUTH DAILY, Disp: 90 tablet, Rfl: 0 .  montelukast (SINGULAIR) 10 MG tablet, TAKE 1 TABLET BY MOUTH DAILY, Disp: 90 tablet, Rfl: 1 .  OXYGEN, at bedtime. 2lpm 24/7 AHC , Disp: , Rfl:  .  OZEMPIC, 1 MG/DOSE, 2 MG/1.5ML SOPN, INJECT 1 MG UNDER THE SKIN ONCE WEEKLY, Disp: 6 mL, Rfl: 6 .  spironolactone (ALDACTONE) 25 MG tablet, TAKE 1 TABLET BY MOUTH DAILY, Disp: 90 tablet, Rfl: 0 .  UNABLE TO FIND, Med Name: CPAP with o2 bled in 2lpm, Disp: , Rfl:  .  albuterol (PROAIR HFA) 108 (90 Base) MCG/ACT inhaler, Inhale 2 puffs into the lungs every 6 (six) hours as needed for wheezing or shortness of breath. (Patient not taking: Reported on 05/15/2020), Disp: , Rfl:  .  allopurinol (ZYLOPRIM) 100 MG tablet, Take 100 mg by mouth daily. (Patient not taking: Reported on 05/15/2020), Disp: , Rfl:  .  budesonide-formoterol (SYMBICORT) 160-4.5 MCG/ACT inhaler, Inhale 2 puffs into the lungs as needed.  (Patient not taking: Reported on 05/15/2020), Disp: , Rfl:  .  ELIQUIS 5 MG TABS tablet, TAKE 1 TABLET BY MOUTH TWICE DAILY., Disp: 60 tablet, Rfl: 5 .  furosemide (LASIX) 40 MG tablet, TAKE 1 TABLET(40 MG) BY MOUTH DAILY (Patient taking differently: 2 tablets daily), Disp: 90 tablet, Rfl: 1   Allergies  Allergen Reactions  . Lisinopril Cough  . Statins Other (See Comments)    Cause muscle weakness     Review of Systems  Constitutional: Negative.   Eyes: Negative for blurred vision.  Respiratory: Negative.  Negative for shortness of breath.   Cardiovascular: Negative.  Negative for chest pain and palpitations.  Gastrointestinal: Negative.   Psychiatric/Behavioral:  Negative.   All other systems reviewed and are negative.    Today's Vitals   05/15/20 1557  BP: 126/74  Pulse: 66  Temp: 98.4 F (36.9 C)  TempSrc: Oral  Weight: 290 lb 9.6 oz (131.8 kg)  Height: 5' 3"  (1.6 m)   Body mass index is 51.48 kg/m.  Wt Readings from Last 3 Encounters:  05/15/20 290 lb 9.6 oz (131.8 kg)  12/25/19 288 lb 12.8 oz (131 kg)  07/19/19 278 lb 6.4 oz (126.3 kg)   Objective:  Physical Exam Vitals and nursing note reviewed.  Constitutional:      Appearance: Normal appearance. She is obese.  HENT:     Head: Normocephalic and atraumatic.  Cardiovascular:     Rate and Rhythm: Normal rate and regular rhythm.     Heart sounds: Normal heart sounds.  Pulmonary:     Breath sounds: Normal breath sounds.  Skin:    General: Skin is warm.  Neurological:     General: No focal deficit present.     Mental Status: She is alert and oriented to person, place, and time.         Assessment And Plan:     1. Type 2 diabetes mellitus without complication, without long-term current use of insulin (HCC) Comments: Chronic, I will check labs as listed below. She is encouraged to incorporate more exercise into her daily routine. I would like for her to consider higher dose  - BMP8+EGFR - Hemoglobin A1c  2. Hypertensive heart disease with chronic diastolic congestive heart failure ([place]) Comments: Chronic, well controlled. She will continue with current meds. She is advised to limit her salt intake.   3. Class 3 severe obesity due to excess calories with serious comorbidity and body mass index (BMI) of 50.0 to 59.9 in adult [facility]) Comments: She is encouraged to lose ten percent of her body weight to decrease cardiac risk. Advised to increase exercise to at least 150 minutes per week.   4. Encounter for HCV screening test for low risk patient Comments: I will check HCV-antibody today.  - Hepatitis C antibody  She is encouraged to strive for BMI less than 30 to decrease  cardiac risk. Advised to aim for at least 150 minutes of exercise per week.   Patient was given opportunity to ask questions. Patient verbalized understanding of the plan and was able to repeat key elements of the plan. All questions were answered to their satisfaction.  Maximino Greenland, MD   I, Maximino Greenland, MD, have reviewed all documentation for this visit. The documentation on 05/27/20 for the exam, diagnosis, procedures, and orders are all accurate and complete.  THE PATIENT IS ENCOURAGED TO PRACTICE SOCIAL DISTANCING DUE TO THE COVID-19 PANDEMIC.

## 2020-05-16 ENCOUNTER — Telehealth: Payer: Self-pay

## 2020-05-16 LAB — BMP8+EGFR
BUN/Creatinine Ratio: 19 (ref 9–23)
BUN: 19 mg/dL (ref 6–24)
CO2: 25 mmol/L (ref 20–29)
Calcium: 9.9 mg/dL (ref 8.7–10.2)
Chloride: 101 mmol/L (ref 96–106)
Creatinine, Ser: 1.01 mg/dL — ABNORMAL HIGH (ref 0.57–1.00)
GFR calc Af Amer: 73 mL/min/{1.73_m2} (ref >59)
GFR calc non Af Amer: 64 mL/min/{1.73_m2} (ref >59)
Glucose: 149 mg/dL — ABNORMAL HIGH (ref 65–99)
Potassium: 3.7 mmol/L (ref 3.5–5.2)
Sodium: 141 mmol/L (ref 134–144)

## 2020-05-16 LAB — HEMOGLOBIN A1C
Est. average glucose Bld gHb Est-mCnc: 183 mg/dL
Hgb A1c MFr Bld: 8 % — ABNORMAL HIGH (ref 4.8–5.6)

## 2020-05-16 LAB — HEPATITIS C ANTIBODY: Hep C Virus Ab: <0.1 s/co ratio (ref 0.0–0.9)

## 2020-05-16 NOTE — Telephone Encounter (Signed)
The patient called to say that she got her lab results from Dr/. Sanders and that she agrees to an increase in her Ozempic.

## 2020-05-18 ENCOUNTER — Other Ambulatory Visit: Payer: Self-pay | Admitting: Internal Medicine

## 2020-06-12 ENCOUNTER — Ambulatory Visit: Payer: BC Managed Care – PPO | Admitting: Neurology

## 2020-06-27 ENCOUNTER — Other Ambulatory Visit: Payer: Self-pay

## 2020-06-27 MED ORDER — FUROSEMIDE 80 MG PO TABS
ORAL_TABLET | ORAL | 0 refills | Status: DC
Start: 2020-06-27 — End: 2020-08-05

## 2020-07-01 ENCOUNTER — Other Ambulatory Visit: Payer: Self-pay

## 2020-07-01 DIAGNOSIS — Z79899 Other long term (current) drug therapy: Secondary | ICD-10-CM

## 2020-07-01 NOTE — Progress Notes (Signed)
bmp 

## 2020-07-02 LAB — BMP8+EGFR
BUN/Creatinine Ratio: 11 (ref 9–23)
BUN: 9 mg/dL (ref 6–24)
CO2: 23 mmol/L (ref 20–29)
Calcium: 9.5 mg/dL (ref 8.7–10.2)
Chloride: 104 mmol/L (ref 96–106)
Creatinine, Ser: 0.83 mg/dL (ref 0.57–1.00)
GFR calc Af Amer: 93 mL/min/{1.73_m2} (ref >59)
GFR calc non Af Amer: 81 mL/min/{1.73_m2} (ref >59)
Glucose: 115 mg/dL — ABNORMAL HIGH (ref 65–99)
Potassium: 4.2 mmol/L (ref 3.5–5.2)
Sodium: 143 mmol/L (ref 134–144)

## 2020-07-20 ENCOUNTER — Other Ambulatory Visit: Payer: Self-pay | Admitting: Internal Medicine

## 2020-07-26 ENCOUNTER — Other Ambulatory Visit: Payer: Self-pay | Admitting: Cardiology

## 2020-07-26 ENCOUNTER — Encounter: Payer: Self-pay | Admitting: Cardiology

## 2020-08-05 ENCOUNTER — Encounter: Payer: Self-pay | Admitting: Internal Medicine

## 2020-08-05 ENCOUNTER — Other Ambulatory Visit: Payer: Self-pay | Admitting: Internal Medicine

## 2020-08-05 MED ORDER — FUROSEMIDE 80 MG PO TABS
ORAL_TABLET | ORAL | 1 refills | Status: DC
Start: 2020-08-05 — End: 2021-04-09

## 2020-08-06 ENCOUNTER — Encounter: Payer: BC Managed Care – PPO | Admitting: Internal Medicine

## 2020-09-09 ENCOUNTER — Other Ambulatory Visit: Payer: Self-pay

## 2020-09-09 ENCOUNTER — Other Ambulatory Visit: Payer: Self-pay | Admitting: Internal Medicine

## 2020-09-09 ENCOUNTER — Encounter: Payer: Self-pay | Admitting: Nurse Practitioner

## 2020-09-09 ENCOUNTER — Ambulatory Visit (INDEPENDENT_AMBULATORY_CARE_PROVIDER_SITE_OTHER): Payer: 59 | Admitting: Nurse Practitioner

## 2020-09-09 VITALS — BP 124/70 | HR 91 | Temp 98.7°F | Ht 63.0 in | Wt 288.2 lb

## 2020-09-09 DIAGNOSIS — I110 Hypertensive heart disease with heart failure: Secondary | ICD-10-CM | POA: Diagnosis not present

## 2020-09-09 DIAGNOSIS — E1169 Type 2 diabetes mellitus with other specified complication: Secondary | ICD-10-CM | POA: Diagnosis not present

## 2020-09-09 DIAGNOSIS — E1165 Type 2 diabetes mellitus with hyperglycemia: Secondary | ICD-10-CM

## 2020-09-09 DIAGNOSIS — I5032 Chronic diastolic (congestive) heart failure: Secondary | ICD-10-CM | POA: Diagnosis not present

## 2020-09-09 MED ORDER — DAPAGLIFLOZIN PROPANEDIOL 10 MG PO TABS: 10.0000 mg | ORAL_TABLET | Freq: Every day | ORAL | 1 refills | Status: DC

## 2020-09-09 NOTE — Progress Notes (Signed)
I,Yamilka Roman [employer] as a Education administrator for [employer], FNP.,have documented all relevant documentation on the behalf of [name], FNP,as directed by  [name], FNP while in the presence of [name], [place]. This visit occurred during the SARS-CoV-2 public health emergency.  Safety protocols were in place, including screening questions prior to the visit, additional usage of staff PPE, and extensive cleaning of exam room while observing appropriate contact time as indicated for disinfecting solutions.  Subjective:     Patient ID: Brandi Howell , female    DOB: 09-05-1966 , 54 y.o.   MRN: [id number]   Chief Complaint  Patient presents with  . Diabetes    HPI  Wt Readings from Last 3 Encounters: 09/09/20 : 288 lb 3.2 oz (130.7 kg) 05/15/20 : 290 lb 9.6 oz (131.8 kg) 12/25/19 : 288 lb 12.8 oz (131 kg)   Diabetes She presents for her follow-up diabetic visit. She has type 2 diabetes mellitus. There are no hypoglycemic associated symptoms. Pertinent negatives for hypoglycemia include no dizziness or headaches. Pertinent negatives for diabetes include no chest pain. There are no hypoglycemic complications. Risk factors for coronary artery disease include sedentary lifestyle, obesity, diabetes mellitus and hypertension. Current diabetic treatment includes oral agent (dual therapy) and oral agent (monotherapy). She is compliant with treatment most of the time. She has not had a previous visit with a dietitian. She rarely participates in exercise. (95-153 blood sugar range) An ACE inhibitor/angiotensin II receptor blocker is being taken. She does not see a podiatrist.Eye exam is not current (she has an appt next month).     Past Medical History:  Diagnosis Date  . Asthma   . Diabetes mellitus without complication ([place])   . Goiter   . Gout   . Hypercholesteremia   . Hypertension   . Vitamin D deficiency      Family History  Problem Relation Age of Onset  . Hypertension  Mother   . Hyperlipidemia Mother   . Heart disease Father      Current Outpatient Medications:  .  amLODipine-olmesartan (AZOR) 10-40 MG tablet, TAKE 1 TABLET BY MOUTH DAILY, Disp: 90 tablet, Rfl: 1 .  cetirizine (ZYRTEC) 10 MG tablet, Take 10 mg by mouth as needed for allergies. , Disp: , Rfl:  .  ELIQUIS 5 MG TABS tablet, TAKE 1 TABLET BY MOUTH TWICE DAILY., Disp: 60 tablet, Rfl: 5 .  furosemide (LASIX) 80 MG tablet, Take 1 tablet by mouth daily, Disp: 90 tablet, Rfl: 1 .  LIVALO 4 MG TABS, TAKE 1 TABLET(4 MG) BY MOUTH DAILY, Disp: 90 tablet, Rfl: 0 .  montelukast (SINGULAIR) 10 MG tablet, TAKE 1 TABLET BY MOUTH DAILY, Disp: 90 tablet, Rfl: 1 .  OXYGEN, at bedtime. 2lpm 24/7 AHC, Disp: , Rfl:  .  OZEMPIC, 1 MG/DOSE, 2 MG/1.5ML SOPN, INJECT 1 MG UNDER THE SKIN ONCE WEEKLY, Disp: 6 mL, Rfl: 6 .  spironolactone (ALDACTONE) 25 MG tablet, TAKE 1 TABLET BY MOUTH DAILY, Disp: 60 tablet, Rfl: 1 .  UNABLE TO FIND, Med Name: CPAP with o2 bled in 2lpm, Disp: , Rfl:  .  albuterol (VENTOLIN HFA) 108 (90 Base) MCG/ACT inhaler, Inhale 2 puffs into the lungs every 6 (six) hours as needed for wheezing or shortness of breath. (Patient not taking: No sig reported), Disp: , Rfl:  .  allopurinol (ZYLOPRIM) 100 MG tablet, Take 100 mg by mouth daily. (Patient not taking: No sig reported), Disp: , Rfl:  .  budesonide-formoterol (SYMBICORT) 160-4.5 MCG/ACT  inhaler, Inhale 2 puffs into the lungs as needed.  (Patient not taking: No sig reported), Disp: , Rfl:  .  dapagliflozin propanediol (FARXIGA) 10 MG TABS tablet, Take 1 tablet (10 mg total) by mouth daily., Disp: 90 tablet, Rfl: 1   Allergies  Allergen Reactions  . Lisinopril Cough  . Statins Other (See Comments)    Cause muscle weakness     Review of Systems  Constitutional: Negative.   Respiratory: Negative.   Cardiovascular: Negative.  Negative for chest pain, palpitations and leg swelling.  Neurological: Negative for dizziness and headaches.   Psychiatric/Behavioral: Negative.      Today's Vitals   09/09/20 1110  BP: 124/70  Pulse: 91  Temp: 98.7 F (37.1 C)  TempSrc: Oral  Weight: 288 lb 3.2 oz (130.7 kg)  Height: 5' 3"  (1.6 m)  PainSc: 0-No pain   Body mass index is 51.05 kg/m.   Objective:  Physical Exam Vitals reviewed.  Constitutional:      General: She is not in acute distress.    Appearance: Normal appearance.  Cardiovascular:     Rate and Rhythm: Normal rate and regular rhythm.     Pulses: Normal pulses.     Heart sounds: Normal heart sounds. No murmur heard.   Pulmonary:     Effort: Pulmonary effort is normal. No respiratory distress.     Breath sounds: Normal breath sounds. No wheezing.  Neurological:     General: No focal deficit present.     Mental Status: She is alert and oriented to person, place, and time.     Cranial Nerves: No cranial nerve deficit.  Psychiatric:        Mood and Affect: Mood normal.        Behavior: Behavior normal.        Thought Content: Thought content normal.        Judgment: Judgment normal.         Assessment And Plan:     1. Type 2 diabetes mellitus without complication, without long-term current use of insulin (HCC) Chronic, she is tolerating farxiga well Will check HgbA1c  - Hemoglobin A1c - CMP14+EGFR - dapagliflozin propanediol (FARXIGA) 10 MG TABS tablet; Take 1 tablet (10 mg total) by mouth daily.  Dispense: 90 tablet; Refill: 1  2. Hypertensive heart disease with chronic diastolic congestive heart failure (HCC) Chronic, well controlled Continue with current medications No changes to medications    Patient was given opportunity to ask questions. Patient verbalized understanding of the plan and was able to repeat key elements of the plan. All questions were answered to their satisfaction.   Teola Bradley, FNP, have reviewed all documentation for this visit. The documentation on 09/15/20 for the exam, diagnosis, procedures, and orders are all  accurate and complete.  THE PATIENT IS ENCOURAGED TO PRACTICE SOCIAL DISTANCING DUE TO THE COVID-19 PANDEMIC.

## 2020-09-09 NOTE — Patient Instructions (Signed)

## 2020-09-10 LAB — CMP14+EGFR
ALT: 29 IU/L (ref 0–32)
AST: 20 IU/L (ref 0–40)
Albumin/Globulin Ratio: 1.7 (ref 1.2–2.2)
Albumin: 5 g/dL — ABNORMAL HIGH (ref 3.8–4.9)
Alkaline Phosphatase: 100 IU/L (ref 44–121)
BUN/Creatinine Ratio: 15 (ref 9–23)
BUN: 12 mg/dL (ref 6–24)
Bilirubin Total: 0.3 mg/dL (ref 0.0–1.2)
CO2: 25 mmol/L (ref 20–29)
Calcium: 10 mg/dL (ref 8.7–10.2)
Chloride: 100 mmol/L (ref 96–106)
Creatinine, Ser: 0.8 mg/dL (ref 0.57–1.00)
GFR calc Af Amer: 97 mL/min/{1.73_m2} (ref >59)
GFR calc non Af Amer: 84 mL/min/{1.73_m2} (ref >59)
Globulin, Total: 2.9 g/dL (ref 1.5–4.5)
Glucose: 174 mg/dL — ABNORMAL HIGH (ref 65–99)
Potassium: 3.8 mmol/L (ref 3.5–5.2)
Sodium: 142 mmol/L (ref 134–144)
Total Protein: 7.9 g/dL (ref 6.0–8.5)

## 2020-09-10 LAB — HEMOGLOBIN A1C
Est. average glucose Bld gHb Est-mCnc: 157 mg/dL
Hgb A1c MFr Bld: 7.1 % — ABNORMAL HIGH (ref 4.8–5.6)

## 2020-09-17 DIAGNOSIS — I110 Hypertensive heart disease with heart failure: Secondary | ICD-10-CM | POA: Insufficient documentation

## 2020-09-18 ENCOUNTER — Other Ambulatory Visit: Payer: Self-pay

## 2020-09-18 ENCOUNTER — Telehealth: Payer: Self-pay

## 2020-09-18 DIAGNOSIS — E1165 Type 2 diabetes mellitus with hyperglycemia: Secondary | ICD-10-CM

## 2020-09-18 MED ORDER — EMPAGLIFLOZIN 10 MG PO TABS: 10.0000 mg | ORAL_TABLET | Freq: Every day | ORAL | 0 refills | Status: DC

## 2020-09-18 NOTE — Telephone Encounter (Signed)
I called pt and left her a v/m to call the office we wanted to inform her that jm has changed her farxiga to jardiance 10mg  due to her ins not covering farxiga. YL,RMA

## 2020-09-18 NOTE — Telephone Encounter (Signed)
The pt was notified a sample of ozempic and some farxiga is ready for pickup.

## 2020-09-24 ENCOUNTER — Other Ambulatory Visit: Payer: Self-pay

## 2020-09-24 NOTE — Telephone Encounter (Signed)
Prior auth completed for Jardiance 10 mg waiting on response from the patient.

## 2020-10-16 ENCOUNTER — Other Ambulatory Visit: Payer: Self-pay | Admitting: Cardiology

## 2020-10-30 ENCOUNTER — Other Ambulatory Visit: Payer: Self-pay

## 2020-10-30 NOTE — Telephone Encounter (Signed)
Prior authorization for ozempic has been done waiting on a response form pt's insurance.

## 2020-11-01 ENCOUNTER — Other Ambulatory Visit: Payer: Self-pay | Admitting: Internal Medicine

## 2020-12-17 ENCOUNTER — Telehealth: Payer: Self-pay

## 2020-12-17 ENCOUNTER — Ambulatory Visit (INDEPENDENT_AMBULATORY_CARE_PROVIDER_SITE_OTHER): Payer: 59 | Admitting: Internal Medicine

## 2020-12-17 ENCOUNTER — Other Ambulatory Visit: Payer: Self-pay

## 2020-12-17 ENCOUNTER — Other Ambulatory Visit (HOSPITAL_COMMUNITY): Payer: Self-pay | Admitting: Internal Medicine

## 2020-12-17 ENCOUNTER — Encounter: Payer: Self-pay | Admitting: Internal Medicine

## 2020-12-17 VITALS — BP 138/76 | HR 85 | Temp 99.1°F | Ht 63.0 in | Wt 290.6 lb

## 2020-12-17 DIAGNOSIS — Z0000 Encounter for general adult medical examination without abnormal findings: Secondary | ICD-10-CM

## 2020-12-17 DIAGNOSIS — I5032 Chronic diastolic (congestive) heart failure: Secondary | ICD-10-CM

## 2020-12-17 DIAGNOSIS — E1165 Type 2 diabetes mellitus with hyperglycemia: Secondary | ICD-10-CM

## 2020-12-17 DIAGNOSIS — Z6841 Body Mass Index (BMI) 40.0 and over, adult: Secondary | ICD-10-CM

## 2020-12-17 DIAGNOSIS — I110 Hypertensive heart disease with heart failure: Secondary | ICD-10-CM

## 2020-12-17 DIAGNOSIS — J9612 Chronic respiratory failure with hypercapnia: Secondary | ICD-10-CM

## 2020-12-17 DIAGNOSIS — E049 Nontoxic goiter, unspecified: Secondary | ICD-10-CM

## 2020-12-17 DIAGNOSIS — R0600 Dyspnea, unspecified: Secondary | ICD-10-CM

## 2020-12-17 DIAGNOSIS — R0609 Other forms of dyspnea: Secondary | ICD-10-CM

## 2020-12-17 DIAGNOSIS — J9611 Chronic respiratory failure with hypoxia: Secondary | ICD-10-CM

## 2020-12-17 DIAGNOSIS — J301 Allergic rhinitis due to pollen: Secondary | ICD-10-CM

## 2020-12-17 LAB — POCT URINALYSIS DIPSTICK
Bilirubin, UA: NEGATIVE
Glucose, UA: POSITIVE — AB
Ketones, UA: NEGATIVE
Nitrite, UA: NEGATIVE
Protein, UA: NEGATIVE
Spec Grav, UA: 1.015 (ref 1.010–1.025)
Urobilinogen, UA: 0.2 E.U./dL
pH, UA: 5.5 (ref 5.0–8.0)

## 2020-12-17 LAB — POCT UA - MICROALBUMIN
Albumin/Creatinine Ratio, Urine, POC: 30
Creatinine, POC: 50 mg/dL
Microalbumin Ur, POC: 10 mg/L

## 2020-12-17 MED ORDER — LEVOCETIRIZINE DIHYDROCHLORIDE 5 MG PO TABS: 5.0000 mg | ORAL_TABLET | Freq: Every evening | ORAL | 1 refills | Status: AC

## 2020-12-17 MED ORDER — DAPAGLIFLOZIN PROPANEDIOL 10 MG PO TABS: 10.0000 mg | ORAL_TABLET | Freq: Every day | ORAL | 5 refills | Status: DC

## 2020-12-17 MED ORDER — WEGOVY 2.4 MG/0.75ML ~~LOC~~ SOAJ: 2.4000 mg | SUBCUTANEOUS | 3 refills | Status: DC

## 2020-12-17 NOTE — Progress Notes (Signed)
I,Katawbba Wiggins,acting as a Education administrator for Maximino Greenland, MD.,have documented all relevant documentation on the behalf of Maximino Greenland, MD,as directed by  Maximino Greenland, MD while in the presence of Maximino Greenland, MD.  This visit occurred during the SARS-CoV-2 public health emergency.  Safety protocols were in place, including screening questions prior to the visit, additional usage of staff PPE, and extensive cleaning of exam room while observing appropriate contact time as indicated for disinfecting solutions.  Subjective:     Patient ID: ACQUANETTA CABANILLA , female    DOB: 1966/05/09 , 55 y.o.   MRN: [id number]   Chief Complaint  Patient presents with  . Annual Exam  . Diabetes  . Hypertension    HPI  She is here today for a full physical examination. She is followed by Dr. Crawford Givens for her pelvic exams. Last pap and mammogram were done in 2018.Marland Kitchen She has no specific concerns or complaints at this time.   Diabetes She presents for her follow-up diabetic visit. She has type 2 diabetes mellitus. There are no hypoglycemic associated symptoms. Pertinent negatives for diabetes include no blurred vision and no chest pain. There are no hypoglycemic complications. Risk factors for coronary artery disease include diabetes mellitus, dyslipidemia, hypertension, obesity and sedentary lifestyle. She is following a diabetic diet. She participates in exercise intermittently. Eye exam is not current.  Hypertension This is a chronic problem. The current episode started more than 1 month ago. The problem has been gradually improving since onset. The problem is controlled. Associated symptoms include shortness of breath. Pertinent negatives include no blurred vision, chest pain or palpitations. Risk factors for coronary artery disease include sedentary lifestyle, diabetes mellitus, dyslipidemia, obesity and post-menopausal state. The current treatment provides moderate improvement. Hypertensive  end-organ damage includes heart failure.     Past Medical History:  Diagnosis Date  . Asthma   . Diabetes mellitus without complication ([place])   . Goiter   . Gout   . Hypercholesteremia   . Hypertension   . Vitamin D deficiency      Family History  Problem Relation Age of Onset  . Hypertension Mother   . Hyperlipidemia Mother   . Heart disease Father      Current Outpatient Medications:  .  albuterol (VENTOLIN HFA) 108 (90 Base) MCG/ACT inhaler, Inhale 2 puffs into the lungs every 6 (six) hours as needed for wheezing or shortness of breath., Disp: , Rfl:  .  budesonide-formoterol (SYMBICORT) 160-4.5 MCG/ACT inhaler, Inhale 2 puffs into the lungs as needed., Disp: , Rfl:  .  cetirizine (ZYRTEC) 10 MG tablet, Take 10 mg by mouth as needed for allergies. , Disp: , Rfl:  .  dapagliflozin propanediol (FARXIGA) 10 MG TABS tablet, Take 1 tablet (10 mg total) by mouth daily before breakfast., Disp: 30 tablet, Rfl: 5 .  ELIQUIS 5 MG TABS tablet, TAKE 1 TABLET BY MOUTH TWICE DAILY. (Patient taking differently: 1 daily), Disp: 60 tablet, Rfl: 5 .  furosemide (LASIX) 80 MG tablet, Take 1 tablet by mouth daily, Disp: 90 tablet, Rfl: 1 .  levocetirizine (XYZAL) 5 MG tablet, Take 1 tablet (5 mg total) by mouth every evening., Disp: 90 tablet, Rfl: 1 .  LIVALO 4 MG TABS, TAKE 1 TABLET(4 MG) BY MOUTH DAILY, Disp: 90 tablet, Rfl: 0 .  OXYGEN, at bedtime. 2lpm 24/7 [facility], Disp: , Rfl:  .  OZEMPIC, 1 MG/DOSE, 2 MG/1.5ML SOPN, INJECT 1 MG UNDER THE SKIN ONCE WEEKLY (Patient taking  differently: 2 mg.), Disp: 6 mL, Rfl: 6 .  UNABLE TO FIND, Med Name: CPAP with o2 bled in 2lpm, Disp: , Rfl:  .  allopurinol (ZYLOPRIM) 100 MG tablet, Take 100 mg by mouth daily., Disp: , Rfl:  .  amLODipine-olmesartan (AZOR) 10-40 MG tablet, TAKE 1 TABLET BY MOUTH DAILY, Disp: 90 tablet, Rfl: 1 .  montelukast (SINGULAIR) 10 MG tablet, TAKE 1 TABLET BY MOUTH DAILY, Disp: 90 tablet, Rfl: 1 .  spironolactone (ALDACTONE) 25 MG  tablet, TAKE 1 TABLET BY MOUTH DAILY, Disp: 60 tablet, Rfl: 1   Allergies  Allergen Reactions  . Lisinopril Cough  . Statins Other (See Comments)    Cause muscle weakness      The patient states she uses none for birth control. Last LMP was No LMP recorded.. Negative for Dysmenorrhea. Negative for: breast discharge, breast lump(s), breast pain and breast self exam. Associated symptoms include abnormal vaginal bleeding. Pertinent negatives include abnormal bleeding (hematology), anxiety, decreased libido, depression, difficulty falling sleep, dyspareunia, history of infertility, nocturia, sexual dysfunction, sleep disturbances, urinary incontinence, urinary urgency, vaginal discharge and vaginal itching. Diet regular.The patient states her exercise level is    . The patient's tobacco use is:  Social History   Tobacco Use  Smoking Status Never Smoker  Smokeless Tobacco Never Used  . She has been exposed to passive smoke. The patient's alcohol use is:  Social History   Substance and Sexual Activity  Alcohol Use Yes   Comment: occ     Review of Systems  Constitutional: Negative.   HENT: Positive for postnasal drip.   Eyes: Negative.  Negative for blurred vision.  Respiratory: Positive for shortness of breath.   Cardiovascular: Negative.  Negative for chest pain and palpitations.  Gastrointestinal: Negative.   Endocrine: Negative.   Genitourinary: Negative.   Musculoskeletal: Negative.   Skin: Negative.   Allergic/Immunologic: Negative.   Neurological: Negative.   Hematological: Negative.   Psychiatric/Behavioral: Negative.      Today's Vitals   12/17/20 0953  BP: 138/76  Pulse: 85  Temp: 99.1 F (37.3 C)  TempSrc: Oral  Weight: 290 lb 9.6 oz (131.8 kg)  Height: 5' 3"  (1.6 m)   Body mass index is 51.48 kg/m.   BP Readings from Last 3 Encounters:  12/23/20 130/76  12/17/20 138/76  09/09/20 124/70   Wt Readings from Last 3 Encounters:  12/23/20 293 lb 6.4 oz  (133.1 kg)  12/17/20 290 lb 9.6 oz (131.8 kg)  09/09/20 288 lb 3.2 oz (130.7 kg)    Objective:  Physical Exam Vitals and nursing note reviewed.  Constitutional:      General: She is not in acute distress.    Appearance: Normal appearance. She is well-developed. She is obese.  HENT:     Head: Normocephalic and atraumatic.     Right Ear: Hearing, tympanic membrane, ear canal and external ear normal. There is no impacted cerumen.     Left Ear: Hearing, tympanic membrane, ear canal and external ear normal. There is no impacted cerumen.     Nose:     Comments: Deferred, masked     Mouth/Throat:     Comments: Deferred, masked  Eyes:     General: Lids are normal.     Extraocular Movements: Extraocular movements intact.     Conjunctiva/sclera: Conjunctivae normal.     Pupils: Pupils are equal, round, and reactive to light.     Funduscopic exam:    Right eye: No papilledema.  Left eye: No papilledema.  Neck:     Thyroid: No thyroid mass.     Vascular: No carotid bruit.  Cardiovascular:     Rate and Rhythm: Normal rate and regular rhythm.     Pulses: Normal pulses.          Dorsalis pedis pulses are 2+ on the right side and 2+ on the left side.     Heart sounds: Murmur heard.    Pulmonary:     Effort: Pulmonary effort is normal.     Breath sounds: Normal breath sounds.  Chest:  Breasts:     Tanner Score is 5.      Comments: She kept bra on.  Abdominal:     General: Abdomen is flat. Bowel sounds are normal. There is no distension.     Palpations: Abdomen is soft.     Tenderness: There is no abdominal tenderness.  Genitourinary:    Comments: Deferred  Musculoskeletal:        General: No swelling. Normal range of motion.     Cervical back: Full passive range of motion without pain, normal range of motion and neck supple.     Right lower leg: No edema.     Left lower leg: No edema.  Feet:     Right foot:     Protective Sensation: 5 sites tested. 5 sites sensed.      Skin integrity: Dry skin present.     Toenail Condition: Right toenails are normal.     Left foot:     Protective Sensation: 5 sites tested. 5 sites sensed.     Skin integrity: Dry skin present.     Toenail Condition: Left toenails are normal.  Skin:    General: Skin is warm and dry.     Capillary Refill: Capillary refill takes less than 2 seconds.  Neurological:     General: No focal deficit present.     Mental Status: She is alert and oriented to person, place, and time.     Cranial Nerves: No cranial nerve deficit.     Sensory: No sensory deficit.  Psychiatric:        Mood and Affect: Mood normal.        Behavior: Behavior normal.        Thought Content: Thought content normal.        Judgment: Judgment normal.         Assessment And Plan:     1. Annual physical exam Comments: A full exam was performed. Importance of monthly self breast exams was discussed with the patient. She is encouraged to schedule f/u with GYN. Cologuard performed DEC 2020 and this was negative. Advised she should have colonoscopy by Dec 2023. PATIENT IS ADVISED TO GET 30-45 MINUTES REGULAR EXERCISE NO LESS THAN FOUR TO FIVE DAYS PER WEEK - BOTH WEIGHTBEARING EXERCISES AND AEROBIC ARE RECOMMENDED.  PATIENT IS ADVISED TO FOLLOW A HEALTHY DIET WITH AT LEAST SIX FRUITS/VEGGIES PER DAY, DECREASE INTAKE OF RED MEAT, AND TO INCREASE FISH INTAKE TO TWO DAYS PER WEEK.  MEATS/FISH SHOULD NOT BE FRIED, BAKED OR BROILED IS PREFERABLE.  IT IS ALSO IMPORTANT TO CUT BACK ON YOUR SUGAR INTAKE. PLEASE AVOID ANYTHING WITH ADDED SUGAR, CORN SYRUP OR OTHER SWEETENERS. IF YOU MUST USE A SWEETENER, YOU CAN TRY STEVIA. IT IS ALSO IMPORTANT TO AVOID ARTIFICIALLY SWEETENERS AND DIET BEVERAGES. LASTLY, I SUGGEST WEARING SPF 50 SUNSCREEN ON EXPOSED PARTS AND ESPECIALLY WHEN IN THE DIRECT SUNLIGHT FOR AN EXTENDED  PERIOD OF TIME.  PLEASE AVOID FAST FOOD RESTAURANTS AND INCREASE YOUR WATER INTAKE.  - Hemoglobin A1c - CBC - CMP14+EGFR -  Lipid panel - TSH  2. Uncontrolled type 2 diabetes mellitus with hyperglycemia ([place]) Comments: Diabetic foot exam was performed. I plan to increase her dose of Ozempic to 80m once available. She is encouraged to schedule eye exam within the next six weeks. I DISCUSSED WITH THE PATIENT AT LENGTH REGARDING THE GOALS OF GLYCEMIC CONTROL AND POSSIBLE LONG-TERM COMPLICATIONS.  I  ALSO STRESSED THE IMPORTANCE OF COMPLIANCE WITH HOME GLUCOSE MONITORING, DIETARY RESTRICTIONS INCLUDING AVOIDANCE OF SUGARY DRINKS/PROCESSED FOODS,  ALONG WITH REGULAR EXERCISE.  I  ALSO STRESSED THE IMPORTANCE OF ANNUAL EYE EXAMS, SELF FOOT CARE AND COMPLIANCE WITH OFFICE VISITS.  - POCT Urinalysis Dipstick (81002) - POCT UA - Microalbumin - Hemoglobin A1c - CBC - CMP14+EGFR - Lipid panel  3. Hypertensive heart disease with chronic diastolic congestive heart failure (HCC) Comments: Chronic, fair control. She was made aware that goal BP is less than 130/80. Advised to follow low sodium diet. EKG performed, NSR w/o acute changes. Again, importance of regular exercise was d/w patient.  - EKG 12-Lead - Hemoglobin A1c - CBC - CMP14+EGFR - Lipid panel  4. Goiter Comments: I will scheule her for thyroid ulrasound.  - [place]; Future  5. Dyspnea on exertion Comments: LIkely multifactorial - deconditioning, heart failure, hypoventilation. I will refer her back to Cardiology for further eval. Needs to f/u with coronary CT.  - Ambulatory referral to Cardiology  6. Chronic respiratory failure with hypoxia and hypercapnia (HCC) Comments: She uses supplemental oxygen nightly along with her CPAP.   7. Seasonal allergic rhinitis due to pollen Comments: She was given rx levocetirizine to use nightly. May need Flonase NS if sx persist/worsen.   8. Class 3 severe obesity due to excess calories with serious comorbidity and body mass index (BMI) of 50.0 to 59.9 in adult ([facility] Comments: BMI 51. She is encouraged to gradually  increase her daily activity - starting w/ 10 minutes and working her way up to 30 minutes.  Patient was given opportunity to ask questions. Patient verbalized understanding of the plan and was able to repeat key elements of the plan. All questions were answered to their satisfaction.   I, RMaximino Greenland MD, have reviewed all documentation for this visit. The documentation on 12/23/20 for the exam, diagnosis, procedures, and orders are all accurate and complete.  THE PATIENT IS ENCOURAGED TO PRACTICE SOCIAL DISTANCING DUE TO THE COVID-19 PANDEMIC.

## 2020-12-17 NOTE — Telephone Encounter (Signed)
Pt notified samples of farxiga was placed up front for pickup.

## 2020-12-17 NOTE — Patient Instructions (Signed)
Health Maintenance, Female Adopting a healthy lifestyle and getting preventive care are important in promoting health and wellness. Ask your health care provider about:  The right schedule for you to have regular tests and exams.  Things you can do on your own to prevent diseases and keep yourself healthy. What should I know about diet, weight, and exercise? Eat a healthy diet  Eat a diet that includes plenty of vegetables, fruits, low-fat dairy products, and lean protein.  Do not eat a lot of foods that are high in solid fats, added sugars, or sodium.   Maintain a healthy weight Body mass index (BMI) is used to identify weight problems. It estimates body fat based on height and weight. Your health care provider can help determine your BMI and help you achieve or maintain a healthy weight. Get regular exercise Get regular exercise. This is one of the most important things you can do for your health. Most adults should:  Exercise for at least 150 minutes each week. The exercise should increase your heart rate and make you sweat (moderate-intensity exercise).  Do strengthening exercises at least twice a week. This is in addition to the moderate-intensity exercise.  Spend less time sitting. Even light physical activity can be beneficial. Watch cholesterol and blood lipids Have your blood tested for lipids and cholesterol at 54 years of age, then have this test every 5 years. Have your cholesterol levels checked more often if:  Your lipid or cholesterol levels are high.  You are older than 54 years of age.  You are at high risk for heart disease. What should I know about cancer screening? Depending on your health history and family history, you may need to have cancer screening at various ages. This may include screening for:  Breast cancer.  Cervical cancer.  Colorectal cancer.  Skin cancer.  Lung cancer. What should I know about heart disease, diabetes, and high blood  pressure? Blood pressure and heart disease  High blood pressure causes heart disease and increases the risk of stroke. This is more likely to develop in people who have high blood pressure readings, are of African descent, or are overweight.  Have your blood pressure checked: ? Every 3-5 years if you are 18-39 years of age. ? Every year if you are 40 years old or older. Diabetes Have regular diabetes screenings. This checks your fasting blood sugar level. Have the screening done:  Once every three years after age 40 if you are at a normal weight and have a low risk for diabetes.  More often and at a younger age if you are overweight or have a high risk for diabetes. What should I know about preventing infection? Hepatitis B If you have a higher risk for hepatitis B, you should be screened for this virus. Talk with your health care provider to find out if you are at risk for hepatitis B infection. Hepatitis C Testing is recommended for:  Everyone born from 1945 through 1965.  Anyone with known risk factors for hepatitis C. Sexually transmitted infections (STIs)  Get screened for STIs, including gonorrhea and chlamydia, if: ? You are sexually active and are younger than 54 years of age. ? You are older than 54 years of age and your health care provider tells you that you are at risk for this type of infection. ? Your sexual activity has changed since you were last screened, and you are at increased risk for chlamydia or gonorrhea. Ask your health care provider   if you are at risk.  Ask your health care provider about whether you are at high risk for HIV. Your health care provider may recommend a prescription medicine to help prevent HIV infection. If you choose to take medicine to prevent HIV, you should first get tested for HIV. You should then be tested every 3 months for as long as you are taking the medicine. Pregnancy  If you are about to stop having your period (premenopausal) and  you may become pregnant, seek counseling before you get pregnant.  Take 400 to 800 micrograms (mcg) of folic acid every day if you become pregnant.  Ask for birth control (contraception) if you want to prevent pregnancy. Osteoporosis and menopause Osteoporosis is a disease in which the bones lose minerals and strength with aging. This can result in bone fractures. If you are 65 years old or older, or if you are at risk for osteoporosis and fractures, ask your health care provider if you should:  Be screened for bone loss.  Take a calcium or vitamin D supplement to lower your risk of fractures.  Be given hormone replacement therapy (HRT) to treat symptoms of menopause. Follow these instructions at home: Lifestyle  Do not use any products that contain nicotine or tobacco, such as cigarettes, e-cigarettes, and chewing tobacco. If you need help quitting, ask your health care provider.  Do not use street drugs.  Do not share needles.  Ask your health care provider for help if you need support or information about quitting drugs. Alcohol use  Do not drink alcohol if: ? Your health care provider tells you not to drink. ? You are pregnant, may be pregnant, or are planning to become pregnant.  If you drink alcohol: ? Limit how much you use to 0-1 drink a day. ? Limit intake if you are breastfeeding.  Be aware of how much alcohol is in your drink. In the [place] one drink equals one 12 oz bottle of beer (355 mL), one 5 oz glass of wine (148 mL), or one 1 oz glass of hard liquor (44 mL). General instructions  Schedule regular health, dental, and eye exams.  Stay current with your vaccines.  Tell your health care provider if: ? You often feel depressed. ? You have ever been abused or do not feel safe at home. Summary  Adopting a healthy lifestyle and getting preventive care are important in promoting health and wellness.  Follow your health care provider's instructions about healthy  diet, exercising, and getting tested or screened for diseases.  Follow your health care provider's instructions on monitoring your cholesterol and blood pressure. This information is not intended to replace advice given to you by your health care provider. Make sure you discuss any questions you have with your health care provider. Document Revised: 09/07/2018 Document Reviewed: 09/07/2018 Elsevier Patient Education  2021 [employer]  

## 2020-12-18 ENCOUNTER — Other Ambulatory Visit: Payer: Self-pay | Admitting: Internal Medicine

## 2020-12-18 ENCOUNTER — Other Ambulatory Visit: Payer: Self-pay | Admitting: Cardiology

## 2020-12-18 LAB — CMP14+EGFR
ALT: 31 IU/L (ref 0–32)
AST: 19 IU/L (ref 0–40)
Albumin/Globulin Ratio: 1.9 (ref 1.2–2.2)
Albumin: 4.9 g/dL (ref 3.8–4.9)
Alkaline Phosphatase: 95 IU/L (ref 44–121)
BUN/Creatinine Ratio: 16 (ref 9–23)
BUN: 14 mg/dL (ref 6–24)
Bilirubin Total: 0.2 mg/dL (ref 0.0–1.2)
CO2: 21 mmol/L (ref 20–29)
Calcium: 10.1 mg/dL (ref 8.7–10.2)
Chloride: 101 mmol/L (ref 96–106)
Creatinine, Ser: 0.87 mg/dL (ref 0.57–1.00)
Globulin, Total: 2.6 g/dL (ref 1.5–4.5)
Glucose: 117 mg/dL — ABNORMAL HIGH (ref 65–99)
Potassium: 3.9 mmol/L (ref 3.5–5.2)
Sodium: 140 mmol/L (ref 134–144)
Total Protein: 7.5 g/dL (ref 6.0–8.5)
eGFR: 79 mL/min/{1.73_m2} (ref >59)

## 2020-12-18 LAB — CBC
Hematocrit: 39.6 % (ref 34.0–46.6)
Hemoglobin: 12.4 g/dL (ref 11.1–15.9)
MCH: 25.3 pg — ABNORMAL LOW (ref 26.6–33.0)
MCHC: 31.3 g/dL — ABNORMAL LOW (ref 31.5–35.7)
MCV: 81 fL (ref 79–97)
Platelets: 212 10*3/uL (ref 150–450)
RBC: 4.91 x10E6/uL (ref 3.77–5.28)
RDW: 15.8 % — ABNORMAL HIGH (ref 11.7–15.4)
WBC: 6.9 10*3/uL (ref 3.4–10.8)

## 2020-12-18 LAB — TSH: TSH: 1.17 u[IU]/mL (ref 0.450–4.500)

## 2020-12-18 LAB — LIPID PANEL
Chol/HDL Ratio: 3.6 ratio (ref 0.0–4.4)
Cholesterol, Total: 167 mg/dL (ref 100–199)
HDL: 47 mg/dL (ref >39)
LDL Chol Calc (NIH): 97 mg/dL (ref 0–99)
Triglycerides: 130 mg/dL (ref 0–149)
VLDL Cholesterol Cal: 23 mg/dL (ref 5–40)

## 2020-12-18 LAB — HEMOGLOBIN A1C
Est. average glucose Bld gHb Est-mCnc: 171 mg/dL
Hgb A1c MFr Bld: 7.6 % — ABNORMAL HIGH (ref 4.8–5.6)

## 2020-12-20 ENCOUNTER — Telehealth: Payer: Self-pay

## 2020-12-20 NOTE — Telephone Encounter (Signed)
The pt was notified that she needed a diabetic eye exam done.

## 2020-12-23 ENCOUNTER — Encounter: Payer: Self-pay | Admitting: Cardiology

## 2020-12-23 ENCOUNTER — Other Ambulatory Visit: Payer: Self-pay

## 2020-12-23 ENCOUNTER — Ambulatory Visit (INDEPENDENT_AMBULATORY_CARE_PROVIDER_SITE_OTHER): Payer: 59 | Admitting: Cardiology

## 2020-12-23 VITALS — BP 130/76 | HR 84 | Ht 63.0 in | Wt 293.4 lb

## 2020-12-23 DIAGNOSIS — I5032 Chronic diastolic (congestive) heart failure: Secondary | ICD-10-CM

## 2020-12-23 DIAGNOSIS — R0609 Other forms of dyspnea: Secondary | ICD-10-CM

## 2020-12-23 DIAGNOSIS — Z86711 Personal history of pulmonary embolism: Secondary | ICD-10-CM

## 2020-12-23 DIAGNOSIS — R0600 Dyspnea, unspecified: Secondary | ICD-10-CM

## 2020-12-23 DIAGNOSIS — I10 Essential (primary) hypertension: Secondary | ICD-10-CM

## 2020-12-23 DIAGNOSIS — Z7189 Other specified counseling: Secondary | ICD-10-CM

## 2020-12-23 DIAGNOSIS — E1169 Type 2 diabetes mellitus with other specified complication: Secondary | ICD-10-CM

## 2020-12-23 DIAGNOSIS — E7800 Pure hypercholesterolemia, unspecified: Secondary | ICD-10-CM

## 2020-12-23 NOTE — Patient Instructions (Signed)

## 2020-12-23 NOTE — Progress Notes (Signed)
Cardiology Office Note:    Date:  12/23/2020   ID:  Brandi Howell, DOB 03-Feb-1966, MRN [id number]  PCP:  Dorothyann Peng, MD  Cardiologist:  Jodelle Red, MD PhD  Referring MD: Dorothyann Peng, MD   CC: follow up   History of Present Illness:    Brandi Howell is a 55 y.o. female with a hx of diastolic heart failure, prior pulmonary embolism who is seen in follow up for the evaluation and management of shortness of breath and chest pain.  She was initially seen by me on 04/07/18. She had recently been hospitalized in 01/2018 with blood clots in the lungs, pneumonia, and heart failure. She also endorsed left chest tightness intermittently. She deferred further workup of chest pain at that time.  Today: Saw Dr. Allyne Gee recently, noted more shortness of breath with exertion.   Dropped to one a day apixaban some time ago, has been on this for some time. She is unsure when she was told to do this. Reviewed the guidelines, recommendation is for BID dosing (though can be 5 mg or 2.5 mg BID based on patient factors). She will discuss this with Dr. Allyne Gee. Never seen by hematology. Cleared by pulmonology, does not need routine follow up. No further clots. No bleeding issues.   Notices that she gets winded more over the last few months. Hasn't done any seriously strenuous activity, but does have to climb about 15 flights of stairs to get to her bedroom. Doesn't have to stop but feels more winded. Rare pinching chest pain, not related to exertion.   Has not been to the gym. No routine activity. Uses CPAP and O2 at night.  Denies other chest pain, shortness of breath at rest. No PND, orthopnea, LE edema or unexpected weight gain. No syncope or palpitations.  Past Medical History:  Diagnosis Date  . Asthma   . Diabetes mellitus without complication (HCC)   . Goiter   . Gout   . Hypercholesteremia   . Hypertension   . Vitamin D deficiency     Past Surgical History:   Procedure Laterality Date  . CHOLECYSTECTOMY    . KNEE ARTHROSCOPY    . KNEE ARTHROSCOPY Left 10/25/2017   Procedure: LEFT KNEE ARTHROSCOPY WITH MEDIAL MENISECTOMY, CHONDROPLASTY;  Surgeon: Gean Birchwood, MD;  Location: [facility] OR;  Service: Orthopedics;  Laterality: Left;  . TUBAL LIGATION      Current Medications: Current Outpatient Medications on File Prior to Visit  Medication Sig  . albuterol (VENTOLIN HFA) 108 (90 Base) MCG/ACT inhaler Inhale 2 puffs into the lungs every 6 (six) hours as needed for wheezing or shortness of breath.  . allopurinol (ZYLOPRIM) 100 MG tablet Take 100 mg by mouth daily.  Marland Kitchen amLODipine-olmesartan (AZOR) 10-40 MG tablet TAKE 1 TABLET BY MOUTH DAILY  . budesonide-formoterol (SYMBICORT) 160-4.5 MCG/ACT inhaler Inhale 2 puffs into the lungs as needed.  . cetirizine (ZYRTEC) 10 MG tablet Take 10 mg by mouth as needed for allergies.   . dapagliflozin propanediol (FARXIGA) 10 MG TABS tablet Take 1 tablet (10 mg total) by mouth daily before breakfast.  . ELIQUIS 5 MG TABS tablet TAKE 1 TABLET BY MOUTH TWICE DAILY. (Patient taking differently: 1 daily)  . furosemide (LASIX) 80 MG tablet Take 1 tablet by mouth daily  . levocetirizine (XYZAL) 5 MG tablet Take 1 tablet (5 mg total) by mouth every evening.  Marland Kitchen LIVALO 4 MG TABS TAKE 1 TABLET(4 MG) BY MOUTH DAILY  . montelukast (SINGULAIR) 10 MG  tablet TAKE 1 TABLET BY MOUTH DAILY  . OXYGEN at bedtime. 2lpm 24/7 [facility]  . OZEMPIC, 1 MG/DOSE, 2 MG/1.5ML SOPN INJECT 1 MG UNDER THE SKIN ONCE WEEKLY (Patient taking differently: 2 mg.)  . spironolactone (ALDACTONE) 25 MG tablet TAKE 1 TABLET BY MOUTH DAILY  . UNABLE TO FIND Med Name: CPAP with o2 bled in 2lpm  . Semaglutide-Weight Management 2.4 MG/0.75ML SOAJ INJECT 2.4 MG INTO THE SKIN ONCE A WEEK.   No current facility-administered medications on file prior to visit.     Allergies:   Lisinopril and Statins   Social History   Tobacco Use  . Smoking status: Never Smoker  .  Smokeless tobacco: Never Used  Vaping Use  . Vaping Use: Never used  Substance Use Topics  . Alcohol use: Yes    Comment: occ  . Drug use: No    Family History: The patient's Family history: gma died at age 54 from MI. That gma's sister had AAA. Father has 2 prior aortic valve surgeries, died at age [id number]. Brother died at age 54 of an MI. Mother with cholesterol and high blood pressure.  ROS:   Please see the history of present illness.  Additional pertinent ROS otherwise unremarkable.  EKGs/Labs/Other Studies Reviewed:    The following studies were reviewed today: Echo from 02/20/18 Study Conclusions  - Procedure narrative: Transthoracic echocardiography. Image   quality was suboptimal. Technically difficult study. - Left ventricle: The cavity size was normal. Wall thickness was   increased in a pattern of moderate LVH. Systolic function was   normal. The estimated ejection fraction was in the range of 60%   to 65%. Wall motion was normal; there were no regional wall   motion abnormalities. Doppler parameters are consistent with   abnormal left ventricular relaxation (grade 1 diastolic   dysfunction). The Ee&' ratio is >15, suggesting elevated LV   filling pressure. - Left atrium: The atrium was normal in size. - Inferior vena cava: The vessel was dilated. The respirophasic   diameter changes were blunted (< 50%), consistent with elevated   central venous pressure.  Impressions: - Technically difficult study. 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  CTA 02/20/18 FINDINGS: Cardiovascular: Satisfactory opacification of the pulmonary arteries to the segmental level. Small nonocclusive pulmonary emboli within segmental and subsegmental branches of the basilar segments of the left lower lobe. Suspected subsegmental nonocclusive pulmonary emboli in the right lower lobe. No evidence of right heart strain. No pericardial  effusion. Grossly normal thoracic aorta.  Mediastinum/Nodes: No enlarged mediastinal, hilar, or axillary lymph nodes. Thyroid gland, trachea, and esophagus demonstrate no significant findings.  Lungs/Pleura: Bilateral patchy areas of airspace consolidation throughout both lungs with upper lobe predominance.  Upper Abdomen: No acute abnormality.  Musculoskeletal: No chest wall abnormality. No acute or significant osseous findings.  Review of the MIP images confirms the above findings.  IMPRESSION: Scattered nonobstructive pulmonary emboli within segmental and subsegmental branches of the basilar segments of the left lower lobe.  Suspected subsegmental nonocclusive pulmonary emboli in the right lower lobe.  No evidence of right heart strain.  Multifocal bilateral airspace consolidation with upper lobe predominance. In the appropriate clinical setting, this likely represents multifocal pneumonia. Some of the peripheral areas of consolidation may potentially represent pulmonary infarctions, however the degree and distribution of airspace consolidation is out of proportion to the small peripheral pulmonary emboli seen, and therefore infectious etiology is favored.  EKG:  EKG is personally reviewed.  The ekg ordered 12/17/20 demonstrates normal sinus rhythm, LVH with repol abnormalities.  Recent Labs: 12/17/2020: ALT 31; BUN 14; Creatinine, Ser 0.87; Hemoglobin 12.4; Platelets 212; Potassium 3.9; Sodium 140; TSH 1.170  Recent Lipid Panel    Component Value Date/Time   CHOL 167 12/17/2020 1445   TRIG 130 12/17/2020 1445   HDL 47 12/17/2020 1445   CHOLHDL 3.6 12/17/2020 1445   LDLCALC 97 12/17/2020 1445    Physical Exam:    VS:  BP 130/76   Pulse 84   Ht 5\' 3"  (1.6 m)   Wt 293 lb 6.4 oz (133.1 kg)   SpO2 98%   BMI 51.97 kg/m     Wt Readings from Last 3 Encounters:  12/23/20 293 lb 6.4 oz (133.1 kg)  12/17/20 290 lb 9.6 oz (131.8 kg)  09/09/20 288 lb 3.2 oz  (130.7 kg)    GEN: Well nourished, well developed in no acute distress HEENT: Normal, moist mucous membranes NECK: No JVD CARDIAC: regular rhythm, normal S1 and S2, no rubs or gallops. 1/6 systolic murmur. VASCULAR: Radial and DP pulses 2+ bilaterally. No carotid bruits RESPIRATORY:  Clear to auscultation without rales, wheezing or rhonchi  ABDOMEN: Soft, non-tender, non-distended MUSCULOSKELETAL:  Ambulates independently SKIN: Warm and dry, no edema NEUROLOGIC:  Alert and oriented x 3. No focal neuro deficits noted. PSYCHIATRIC:  Normal affect   ASSESSMENT:    1. Dyspnea on exertion   2. Chronic diastolic heart failure (HCC)   3. Essential hypertension   4. History of pulmonary embolism   5. Type 2 diabetes mellitus with hypercholesterolemia (HCC)   6. Cardiac risk counseling   7. Counseling on health promotion and disease prevention    PLAN:    Shortness of breath with exertion -she notices some mild shortness of breath on exertion. We discussed possible etiologies of this. -given body habitus, do not think CT would be a good option for her -we discussed both echo and 2 day nuclear stress test, either chemical or treadmill -she would like to think about these options and contact me -we discussed red flag warning signs that need immediate medical attention -after shared decision making, she will think about testing and contact me if she wishes to proceed now. Otherwise, she will monitor her symptoms and we will follow up in 3 mos  Recent DVT/PE: we reviewed her anticoagulation. She dropped her apixaban to 5 mg once daily. Discussed that based on the way the drug is active, this isn't adequate dosing. Should be on apixaban 5 mg BID. She isn't sure who told her to drop it, but she denies bleeding issues. Would recommend returning to 5 mg BID  Chronic diastolic heart failure Type II diabetes not on insulin Hypercholesterolemia -appears euvolemic today -continue furosemide,  spironolactone, dapagliflozin. States she is not currently taking semaglutide -continue pitavastatin. Reports issues on prior statins -no aspirin while she is on apixaban  Hypertension -continue amlodipine 10mg -olmesartan 40 mg combo pill.  -furosemide and spironolactone as above -goal <130/80, at goal today  Obesity -working on weight loss -BMI ~52 today  sleep apnea:  -on CPAP  ASCVD prevention and risk factor management: -recommend heart healthy/Mediterranean diet, with whole grains, fruits, vegetable, fish, lean meats, nuts, and olive oil. Limit salt. -recommend moderate walking, 3-5 times/week for 30-50 minutes each session. Aim for at least 150 minutes.week. Goal should be pace of 3 miles/hours, or walking 1.5 miles in 30 minutes -recommend avoidance of tobacco products. Avoid excess  alcohol.  The 10-year ASCVD risk score Denman George DC Montez Hageman., et al., 2013) is: 11.2%   Values used to calculate the score:     Age: 81 years     Sex: Female     Is Non-Hispanic African American: Yes     Diabetic: Yes     Tobacco smoker: No     Systolic Blood Pressure: 130 mmHg     Is BP treated: Yes     HDL Cholesterol: 47 mg/dL     Total Cholesterol: 167 mg/dL  Follow up: 3 mos  Jodelle Red, MD, PhD, [facility]   [facility]   Medication Adjustments/Labs and Tests Ordered: Current medicines are reviewed at length with the patient today.  Concerns regarding medicines are outlined above.  No orders of the defined types were placed in this encounter.  No orders of the defined types were placed in this encounter.   Patient Instructions  Medication Instructions:  Your Physician recommend you continue on your current medication as directed.    *If you need a refill on your cardiac medications before your next appointment, please call your pharmacy*   Lab Work: None   Testing/Procedures: None   Follow-Up: At [facility], you and your health needs are our  priority.  As part of our continuing mission to provide you with exceptional heart care, we have created designated Provider Care Teams.  These Care Teams include your primary Cardiologist (physician) and Advanced Practice Providers (APPs -  Physician Assistants and Nurse Practitioners) who all work together to provide you with the care you need, when you need it.  We recommend signing up for the patient portal called "MyChart".  Sign up information is provided on this After Visit Summary.  MyChart is used to connect with patients for Virtual Visits (Telemedicine).  Patients are able to view lab/test results, encounter notes, upcoming appointments, etc.  Non-urgent messages can be sent to your provider as well.   To learn more about what you can do with MyChart, go to [email or web address].    Your next appointment:   3 month(s)  The format for your next appointment:   In Person  Provider:   Jodelle Red, MD      Signed, Jodelle Red, MD PhD 12/23/2020    [facility]

## 2020-12-31 ENCOUNTER — Encounter: Payer: Self-pay | Admitting: Cardiology

## 2021-01-16 ENCOUNTER — Other Ambulatory Visit: Payer: Self-pay

## 2021-01-16 ENCOUNTER — Telehealth: Payer: Self-pay

## 2021-01-16 MED ORDER — EMPAGLIFLOZIN 10 MG PO TABS: 10.0000 mg | ORAL_TABLET | Freq: Every day | ORAL | 1 refills | Status: DC

## 2021-01-16 NOTE — Telephone Encounter (Signed)
Per JM stop farxiga it's not covered by insurance and start jardiance 10mg  prescription sent left detailed message

## 2021-03-05 NOTE — Progress Notes (Incomplete)
Cardiology Office Note:    Date:  03/05/2021   ID:  Brandi Howell, DOB [birth year], MRN [id number]  PCP:  Dorothyann Peng, Brandi  Cardiologist:  Brandi Red, Brandi Howell  Referring Brandi: Dorothyann Peng, Brandi   CC: follow up   History of Present Illness:    Brandi Howell is a 55 y.o. female with a hx of diastolic heart failure, prior pulmonary embolism who is seen in follow up for the evaluation and management of shortness of breath and chest pain.  She was initially seen by me on 04/07/18. She had recently been hospitalized in 01/2018 with blood clots in the lungs, pneumonia, and heart failure. She also endorsed left chest tightness intermittently. She deferred further workup of chest pain at that time.  Previous Visit: Saw Dr. Allyne Gee recently, noted more shortness of breath with exertion.   Dropped to one a day apixaban some time ago, has been on this for some time. She is unsure when she was told to do this. Reviewed the guidelines, recommendation is for BID dosing (though can be 5 mg or 2.5 mg BID based on patient factors). She will discuss this with Dr. Allyne Gee. Never seen by hematology. Cleared by pulmonology, does not need routine follow up. No further clots. No bleeding issues.   Notices that she gets winded more over the last few months. Hasn't done any seriously strenuous activity, but does have to climb about 15 flights of stairs to get to her bedroom. Doesn't have to stop but feels more winded. Rare pinching chest pain, not related to exertion.   Has not been to the gym. No routine activity. Uses CPAP and O2 at night.  Denies other chest pain, shortness of breath at rest. No PND, orthopnea, LE edema or unexpected weight gain. No syncope or palpitations.  Today:  She denies any chest pain, shortness of breath, palpitations, or exertional symptoms. No headaches, lightheadedness, or syncope to report. Also has no lower extremity edema, orthopnea or PND.   Past Medical  History:  Diagnosis Date   Asthma    Diabetes mellitus without complication (HCC)    Goiter    Gout    Hypercholesteremia    Hypertension    Vitamin D deficiency     Past Surgical History:  Procedure Laterality Date   CHOLECYSTECTOMY     KNEE ARTHROSCOPY     KNEE ARTHROSCOPY Left 10/25/2017   Procedure: LEFT KNEE ARTHROSCOPY WITH MEDIAL MENISECTOMY, CHONDROPLASTY;  Surgeon: Gean Birchwood, Brandi;  Location: [facility] OR;  Service: Orthopedics;  Laterality: Left;   TUBAL LIGATION      Current Medications: Current Outpatient Medications on File Prior to Visit  Medication Sig   albuterol (VENTOLIN HFA) 108 (90 Base) MCG/ACT inhaler Inhale 2 puffs into the lungs every 6 (six) hours as needed for wheezing or shortness of breath.   allopurinol (ZYLOPRIM) 100 MG tablet Take 100 mg by mouth daily.   amLODipine-olmesartan (AZOR) 10-40 MG tablet TAKE 1 TABLET BY MOUTH DAILY   budesonide-formoterol (SYMBICORT) 160-4.5 MCG/ACT inhaler Inhale 2 puffs into the lungs as needed.   cetirizine (ZYRTEC) 10 MG tablet Take 10 mg by mouth as needed for allergies.    ELIQUIS 5 MG TABS tablet TAKE 1 TABLET BY MOUTH TWICE DAILY. (Patient taking differently: 1 daily)   empagliflozin (JARDIANCE) 10 MG TABS tablet Take 1 tablet (10 mg total) by mouth daily before breakfast.   furosemide (LASIX) 80 MG tablet Take 1 tablet by mouth daily   levocetirizine (XYZAL)  5 MG tablet Take 1 tablet (5 mg total) by mouth every evening.   LIVALO 4 MG TABS TAKE 1 TABLET(4 MG) BY MOUTH DAILY   montelukast (SINGULAIR) 10 MG tablet TAKE 1 TABLET BY MOUTH DAILY   OXYGEN at bedtime. 2lpm 24/7 AHC   OZEMPIC, 1 MG/DOSE, 2 MG/1.5ML SOPN INJECT 1 MG UNDER THE SKIN ONCE WEEKLY (Patient taking differently: 2 mg.)   Semaglutide-Weight Management 2.4 MG/0.75ML SOAJ INJECT 2.4 MG INTO THE SKIN ONCE A WEEK.   spironolactone (ALDACTONE) 25 MG tablet TAKE 1 TABLET BY MOUTH DAILY   UNABLE TO FIND Med Name: CPAP with o2 bled  in 2lpm   No current facility-administered medications on file prior to visit.     Allergies:   Lisinopril and Statins   Social History   Tobacco Use   Smoking status: Never Smoker   Smokeless tobacco: Never Used  Vaping Use   Vaping Use: Never used  Substance Use Topics   Alcohol use: Yes    Comment: occ   Drug use: No    Family History: The patient's Family history: gma died at age 69 from MI. That gma's sister had AAA. Father has 2 prior aortic valve surgeries, died at age 44. Brother died at age 54 of an MI. Mother with cholesterol and high blood pressure.  ROS:   Please see the history of present illness.   (+) Additional pertinent ROS otherwise unremarkable.  EKGs/Labs/Other Studies Reviewed:    The following studies were reviewed today:  Echo from 02/20/18 Study Conclusions  - Procedure narrative: Transthoracic echocardiography. Image   quality was suboptimal. Technically difficult study. - Left ventricle: The cavity size was normal. Wall thickness was   increased in a pattern of moderate LVH. Systolic function was   normal. The estimated ejection fraction was in the range of 60%   to 65%. Wall motion was normal; there were no regional wall   motion abnormalities. Doppler parameters are consistent with   abnormal left ventricular relaxation (grade 1 diastolic   dysfunction). The Ee&' ratio is >15, suggesting elevated LV   filling pressure. - Left atrium: The atrium was normal in size. - Inferior vena cava: The vessel was dilated. The respirophasic   diameter changes were blunted (< 50%), consistent with elevated   central venous pressure.  Impressions: - Technically difficult study. 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  CTA 02/20/18 FINDINGS: Cardiovascular: Satisfactory opacification of the pulmonary arteries to the segmental level. Small nonocclusive pulmonary emboli within segmental and  subsegmental branches of the basilar segments of the left lower lobe. Suspected subsegmental nonocclusive pulmonary emboli in the right lower lobe. No evidence of right heart strain. No pericardial effusion. Grossly normal thoracic aorta.  Mediastinum/Nodes: No enlarged mediastinal, hilar, or axillary lymph nodes. Thyroid gland, trachea, and esophagus demonstrate no significant findings.  Lungs/Pleura: Bilateral patchy areas of airspace consolidation throughout both lungs with upper lobe predominance.  Upper Abdomen: No acute abnormality.  Musculoskeletal: No chest wall abnormality. No acute or significant osseous findings.  Review of the MIP images confirms the above findings.  IMPRESSION: Scattered nonobstructive pulmonary emboli within segmental and subsegmental branches of the basilar segments of the left lower lobe.  Suspected subsegmental nonocclusive pulmonary emboli in the right lower lobe.  No evidence of right heart strain.  Multifocal bilateral airspace consolidation with upper lobe predominance. In the appropriate clinical setting, this likely represents multifocal pneumonia. Some of the peripheral  areas of consolidation may potentially represent pulmonary infarctions, however the degree and distribution of airspace consolidation is out of proportion to the small peripheral pulmonary emboli seen, and therefore infectious etiology is favored.  EKG:  EKG is personally reviewed.   03/07/2021: Sinus Rhythm. Rate *** bpm. 12/17/20 demonstrates normal sinus rhythm, LVH with repol abnormalities.  Recent Labs: 12/17/2020: ALT 31; BUN 14; Creatinine, Ser 0.87; Hemoglobin 12.4; Platelets 212; Potassium 3.9; Sodium 140; TSH 1.170  Recent Lipid Panel    Component Value Date/Time   CHOL 167 12/17/2020 1445   TRIG 130 12/17/2020 1445   HDL 47 12/17/2020 1445   CHOLHDL 3.6 12/17/2020 1445   LDLCALC 97 12/17/2020 1445    Physical Exam:    VS:  There were no  vitals taken for this visit.    Wt Readings from Last 3 Encounters:  12/23/20 293 lb 6.4 oz (133.1 kg)  12/17/20 290 lb 9.6 oz (131.8 kg)  09/09/20 288 lb 3.2 oz (130.7 kg)    GEN: Well nourished, well developed in no acute distress HEENT: Normal, moist mucous membranes NECK: No JVD CARDIAC: regular rhythm, normal S1 and S2, no rubs or gallops. 1/6 systolic murmur.*** VASCULAR: Radial and DP pulses 2+ bilaterally. No carotid bruits RESPIRATORY:  Clear to auscultation without rales, wheezing or rhonchi  ABDOMEN: Soft, non-tender, non-distended MUSCULOSKELETAL:  Ambulates independently SKIN: Warm and dry, no edema NEUROLOGIC:  Alert and oriented x 3. No focal neuro deficits noted. PSYCHIATRIC:  Normal affect   ASSESSMENT:    No diagnosis found. PLAN:    Shortness of breath with exertion -she notices some mild shortness of breath on exertion. We discussed possible etiologies of this. -given body habitus, do not think CT would be a good option for her -we discussed both echo and 2 day nuclear stress test, either chemical or treadmill -she would like to think about these options and contact me -we discussed red flag warning signs that need immediate medical attention -after shared decision making, she will think about testing and contact me if she wishes to proceed now. Otherwise, she will monitor her symptoms and we will follow up in 3 mos  Recent DVT/PE: we reviewed her anticoagulation. She dropped her apixaban to 5 mg once daily. Discussed that based on the way the drug is active, this isn't adequate dosing. Should be on apixaban 5 mg BID. She isn't sure who told her to drop it, but she denies bleeding issues. Would recommend returning to 5 mg BID  Chronic diastolic heart failure Type II diabetes not on insulin Hypercholesterolemia -appears euvolemic today -continue furosemide, spironolactone, dapagliflozin. States she is not currently taking semaglutide -continue pitavastatin.  Reports issues on prior statins -no aspirin while she is on apixaban  Hypertension -continue amlodipine 10mg -olmesartan 40 mg combo pill.  -furosemide and spironolactone as above -goal <130/80, at goal today  Obesity -working on weight loss -BMI ~52 today  sleep apnea:  -on CPAP  ASCVD prevention and risk factor management: -recommend heart healthy/Mediterranean diet, with whole grains, fruits, vegetable, fish, lean meats, nuts, and olive oil. Limit salt. -recommend moderate walking, 3-5 times/week for 30-50 minutes each session. Aim for at least 150 minutes.week. Goal should be pace of 3 miles/hours, or walking 1.5 miles in 30 minutes -recommend avoidance of tobacco products. Avoid excess alcohol.  The 10-year ASCVD risk score DC Jr., et al., 2013) is: 11.2%   Values used to calculate the score:     Age: 57 years  Sex: Female     Is Non-Hispanic African American: Yes     Diabetic: Yes     Tobacco smoker: No     Systolic Blood Pressure: 130 mmHg     Is BP treated: Yes     HDL Cholesterol: 47 mg/dL     Total Cholesterol: 167 mg/dL  Follow up: 3 mos ***  Brandi RedBridgette Christopher, Brandi, Howell, [facility] [place]   [facility]   Medication Adjustments/Labs and Tests Ordered: Current medicines are reviewed at length with the patient today.  Concerns regarding medicines are outlined above.  No orders of the defined types were placed in this encounter.  No orders of the defined types were placed in this encounter.   There are no Patient Instructions on file for this visit.   I,Mathew Stumpf,acting as a Neurosurgeonscribe for Genuine PartsBridgette Christopher, Brandi.,have documented all relevant documentation on the behalf of Brandi RedBridgette Christopher, Brandi,as directed by  Brandi RedBridgette Christopher, Brandi while in the presence of Brandi RedBridgette Christopher, Brandi.  ***  Signed, Brandi RedBridgette Christopher, Brandi Howell 03/05/2021    [facility]

## 2021-03-07 ENCOUNTER — Ambulatory Visit: Payer: 59 | Admitting: Cardiology

## 2021-03-24 ENCOUNTER — Ambulatory Visit (INDEPENDENT_AMBULATORY_CARE_PROVIDER_SITE_OTHER): Payer: 59 | Admitting: Internal Medicine

## 2021-03-24 ENCOUNTER — Encounter: Payer: Self-pay | Admitting: Internal Medicine

## 2021-03-24 ENCOUNTER — Other Ambulatory Visit: Payer: Self-pay

## 2021-03-24 VITALS — BP 110/72 | HR 86 | Temp 98.6°F | Ht 63.0 in | Wt 286.6 lb

## 2021-03-24 DIAGNOSIS — I110 Hypertensive heart disease with heart failure: Secondary | ICD-10-CM

## 2021-03-24 DIAGNOSIS — Z23 Encounter for immunization: Secondary | ICD-10-CM

## 2021-03-24 DIAGNOSIS — E1165 Type 2 diabetes mellitus with hyperglycemia: Secondary | ICD-10-CM | POA: Diagnosis not present

## 2021-03-24 DIAGNOSIS — I5032 Chronic diastolic (congestive) heart failure: Secondary | ICD-10-CM | POA: Diagnosis not present

## 2021-03-24 DIAGNOSIS — Z6841 Body Mass Index (BMI) 40.0 and over, adult: Secondary | ICD-10-CM

## 2021-03-24 DIAGNOSIS — Z86711 Personal history of pulmonary embolism: Secondary | ICD-10-CM

## 2021-03-24 MED ORDER — OZEMPIC (2 MG/DOSE) 8 MG/3ML ~~LOC~~ SOPN: 2.0000 mg | PEN_INJECTOR | SUBCUTANEOUS | 3 refills | Status: DC

## 2021-03-24 MED ORDER — SHINGRIX 50 MCG/0.5ML IM SUSR: 0.5000 mL | Freq: Once | INTRAMUSCULAR | 0 refills | Status: AC

## 2021-03-24 NOTE — Progress Notes (Signed)
I,Katawbba Wiggins,acting as a Education administrator for Maximino Greenland, MD.,have documented all relevant documentation on the behalf of Maximino Greenland, MD,as directed by  Maximino Greenland, MD while in the presence of Maximino Greenland, MD.  This visit occurred during the SARS-CoV-2 public health emergency.  Safety protocols were in place, including screening questions prior to the visit, additional usage of staff PPE, and extensive cleaning of exam room while observing appropriate contact time as indicated for disinfecting solutions.  Subjective:     Patient ID: Brandi Howell , female    DOB: 18-Dec-1965 , 55 y.o.   MRN: [id number]   Chief Complaint  Patient presents with   Diabetes   Hypertension    HPI  The patient is here today for a diabetes/HTN  f/u.  She reports compliance with meds. She denies headaches, palpitations and shortness of breath.   Diabetes She presents for her follow-up diabetic visit. She has type 2 diabetes mellitus. There are no hypoglycemic associated symptoms. Pertinent negatives for hypoglycemia include no dizziness or headaches. Pertinent negatives for diabetes include no chest pain. There are no hypoglycemic complications. Risk factors for coronary artery disease include sedentary lifestyle, obesity, diabetes mellitus and hypertension. Current diabetic treatment includes oral agent (dual therapy) and oral agent (monotherapy). She is compliant with treatment most of the time. She has not had a previous visit with a dietitian. She rarely participates in exercise. (95-153 blood sugar range) An ACE inhibitor/angiotensin II receptor blocker is being taken. She does not see a podiatrist.Eye exam is not current (she has an appt next month).    Past Medical History:  Diagnosis Date   Asthma    Diabetes mellitus without complication ([place])    Goiter    Gout    Hypercholesteremia    Hypertension    Vitamin D deficiency      Family History  Problem Relation Age of Onset    Hypertension Mother    Hyperlipidemia Mother    Heart disease Father      Current Outpatient Medications:    albuterol (VENTOLIN HFA) 108 (90 Base) MCG/ACT inhaler, Inhale 2 puffs into the lungs every 6 (six) hours as needed for wheezing or shortness of breath., Disp: , Rfl:    amLODipine-olmesartan (AZOR) 10-40 MG tablet, TAKE 1 TABLET BY MOUTH DAILY, Disp: 90 tablet, Rfl: 1   budesonide-formoterol (SYMBICORT) 160-4.5 MCG/ACT inhaler, Inhale 2 puffs into the lungs as needed., Disp: , Rfl:    cetirizine (ZYRTEC) 10 MG tablet, Take 10 mg by mouth as needed for allergies. , Disp: , Rfl:    ELIQUIS 5 MG TABS tablet, TAKE 1 TABLET BY MOUTH TWICE DAILY. (Patient taking differently: 1 daily), Disp: 60 tablet, Rfl: 5   furosemide (LASIX) 80 MG tablet, Take 1 tablet by mouth daily, Disp: 90 tablet, Rfl: 1   levocetirizine (XYZAL) 5 MG tablet, Take 1 tablet (5 mg total) by mouth every evening., Disp: 90 tablet, Rfl: 1   LIVALO 4 MG TABS, TAKE 1 TABLET(4 MG) BY MOUTH DAILY, Disp: 90 tablet, Rfl: 0   montelukast (SINGULAIR) 10 MG tablet, TAKE 1 TABLET BY MOUTH DAILY, Disp: 90 tablet, Rfl: 1   OXYGEN, at bedtime. 2lpm 24/7 A[facility], Disp: , Rfl:    OZEMPIC, 1 MG/DOSE, 2 MG/1.5ML SOPN, INJECT 1 MG UNDER THE SKIN ONCE WEEKLY (Patient taking differently: 2 mg.), Disp: 6 mL, Rfl: 6   spironolactone (ALDACTONE) 25 MG tablet, TAKE 1 TABLET BY MOUTH DAILY, Disp: 60 tablet, Rfl: 1  UNABLE TO FIND, Med Name: CPAP with o2 bled in 2lpm, Disp: , Rfl:    Zoster Vaccine Adjuvanted (SHINGRIX) injection, Inject 0.5 mLs into the muscle once for 1 dose., Disp: 0.5 mL, Rfl: 0   allopurinol (ZYLOPRIM) 100 MG tablet, Take 100 mg by mouth daily. (Patient not taking: Reported on 03/24/2021), Disp: , Rfl:    empagliflozin (JARDIANCE) 10 MG TABS tablet, Take 1 tablet (10 mg total) by mouth daily before breakfast. (Patient not taking: Reported on 03/24/2021), Disp: 30 tablet, Rfl: 1   Allergies  Allergen Reactions   Lisinopril Cough    Statins Other (See Comments)    Cause muscle weakness     Review of Systems  Constitutional: Negative.   HENT: Negative.    Respiratory: Negative.    Cardiovascular: Negative.  Negative for chest pain.  Gastrointestinal: Negative.   Neurological:  Negative for dizziness and headaches.  Psychiatric/Behavioral: Negative.    All other systems reviewed and are negative.   Today's Vitals   03/24/21 1007  BP: 110/72  Pulse: 86  Temp: 98.6 F (37 C)  TempSrc: Oral  Weight: 286 lb 9.6 oz (130 kg)  Height: _0  (1.6 m)   Body mass index is 50.77 kg/m.  Wt Readings from Last 3 Encounters:  03/24/21 286 lb 9.6 oz (130 kg)  12/23/20 293 lb 6.4 oz (133.1 kg)  12/17/20 290 lb 9.6 oz (131.8 kg)    BP Readings from Last 3 Encounters:  03/24/21 110/72  12/23/20 130/76  12/17/20 138/76    Objective:  Physical Exam Vitals and nursing note reviewed.  Constitutional:      Appearance: Normal appearance. She is obese.  HENT:     Head: Normocephalic and atraumatic.     Nose:     Comments: Masked     Mouth/Throat:     Comments: Masked  Cardiovascular:     Rate and Rhythm: Normal rate and regular rhythm.     Heart sounds: Normal heart sounds.  Pulmonary:     Effort: Pulmonary effort is normal.     Breath sounds: Normal breath sounds.  Musculoskeletal:     Cervical back: Normal range of motion.  Skin:    General: Skin is warm.  Neurological:     General: No focal deficit present.     Mental Status: She is alert.  Psychiatric:        Mood and Affect: Mood normal.        Behavior: Behavior normal.        Assessment And Plan:     1. Uncontrolled type 2 diabetes mellitus with hyperglycemia (HCC) Comments: Chronic, I will increase Ozempic to 38m weekly. She was also given a sample. Advised insurance may require PA for 268mdose.  - Hemoglobin A1c - BMP8+EGFR  2. Hypertensive heart disease with chronic diastolic congestive heart failure (HCMidwayComments: Chronic, well  controlled. Encouraged to folllow low sodium diet.   3. Class 3 severe obesity due to excess calories with serious comorbidity and body mass index (BMI) of 50.0 to 59.9 in adult ([facility]enterComments: BMI 50 - she was commended on her regular walking regimen. Encouraged to strive to lose ten percent of her body weight to decrease cardiac risk.   4. History of pulmonary embolism Comments: Advised to take Eliquis as prescribed, twice daily.   5. Immunization due Comments: I will send rx Shingrix to local pharmacy.    Patient was given opportunity to ask questions. Patient verbalized understanding of the plan  and was able to repeat key elements of the plan. All questions were answered to their satisfaction.   I, Maximino Greenland, MD, have reviewed all documentation for this visit. The documentation on 03/24/21 for the exam, diagnosis, procedures, and orders are all accurate and complete.   IF YOU HAVE BEEN REFERRED TO A SPECIALIST, IT MAY TAKE 1-2 WEEKS TO SCHEDULE/PROCESS THE REFERRAL. IF YOU HAVE NOT HEARD FROM US/SPECIALIST IN TWO WEEKS, PLEASE GIVE [place] A CALL AT [phone].   THE PATIENT IS ENCOURAGED TO PRACTICE SOCIAL DISTANCING DUE TO THE COVID-19 PANDEMIC.

## 2021-03-25 ENCOUNTER — Telehealth: Payer: Self-pay

## 2021-03-25 LAB — HEMOGLOBIN A1C
Est. average glucose Bld gHb Est-mCnc: 163 mg/dL
Hgb A1c MFr Bld: 7.3 % — ABNORMAL HIGH (ref 4.8–5.6)

## 2021-03-25 LAB — BMP8+EGFR
BUN/Creatinine Ratio: 13 (ref 9–23)
BUN: 12 mg/dL (ref 6–24)
CO2: 26 mmol/L (ref 20–29)
Calcium: 9.7 mg/dL (ref 8.7–10.2)
Chloride: 106 mmol/L (ref 96–106)
Creatinine, Ser: 0.91 mg/dL (ref 0.57–1.00)
Glucose: 104 mg/dL — ABNORMAL HIGH (ref 65–99)
Potassium: 4.3 mmol/L (ref 3.5–5.2)
Sodium: 144 mmol/L (ref 134–144)
eGFR: 75 mL/min/{1.73_m2} (ref >59)

## 2021-03-25 NOTE — Telephone Encounter (Signed)
I left the pt a message that her Ozempic has been approved  and the pharmacy has been notified.

## 2021-04-01 ENCOUNTER — Telehealth: Payer: Self-pay

## 2021-04-01 NOTE — Telephone Encounter (Signed)
Looked at dispense hx  

## 2021-04-08 ENCOUNTER — Other Ambulatory Visit: Payer: Self-pay | Admitting: Internal Medicine

## 2021-05-08 ENCOUNTER — Other Ambulatory Visit: Payer: Self-pay

## 2021-05-08 MED ORDER — DAPAGLIFLOZIN PROPANEDIOL 10 MG PO TABS: 10.0000 mg | ORAL_TABLET | Freq: Every day | ORAL | 2 refills | Status: DC

## 2021-05-19 ENCOUNTER — Other Ambulatory Visit: Payer: Self-pay | Admitting: Cardiology

## 2021-05-26 ENCOUNTER — Telehealth: Payer: Self-pay

## 2021-05-26 NOTE — Telephone Encounter (Signed)
error 

## 2021-05-28 ENCOUNTER — Other Ambulatory Visit: Payer: Self-pay | Admitting: Internal Medicine

## 2021-06-21 ENCOUNTER — Other Ambulatory Visit: Payer: Self-pay | Admitting: Cardiology

## 2021-06-23 NOTE — Telephone Encounter (Signed)
Rx(s) sent to pharmacy electronically.  

## 2021-07-02 ENCOUNTER — Ambulatory Visit: Payer: 59 | Admitting: Internal Medicine

## 2021-07-03 LAB — HM PAP SMEAR

## 2021-07-03 LAB — HM MAMMOGRAPHY

## 2021-07-07 LAB — HM PAP SMEAR: HM Pap smear: NORMAL

## 2021-08-20 ENCOUNTER — Telehealth: Payer: Self-pay

## 2021-08-20 NOTE — Telephone Encounter (Signed)
I called the pt to schedule her an appt for a diabetes f/u and for possible replacement for Ozempic.

## 2021-08-22 ENCOUNTER — Other Ambulatory Visit: Payer: Self-pay | Admitting: Internal Medicine

## 2021-08-25 ENCOUNTER — Other Ambulatory Visit: Payer: Self-pay

## 2021-08-25 MED ORDER — DAPAGLIFLOZIN PROPANEDIOL 10 MG PO TABS: 10.0000 mg | ORAL_TABLET | Freq: Every day | ORAL | 1 refills | Status: DC

## 2021-08-25 MED ORDER — APIXABAN 5 MG PO TABS
5.0000 mg | ORAL_TABLET | Freq: Two times a day (BID) | ORAL | 0 refills | Status: DC
Start: 2021-08-25 — End: 2021-11-03

## 2021-08-25 MED ORDER — FUROSEMIDE 80 MG PO TABS: 80.0000 mg | ORAL_TABLET | Freq: Every day | ORAL | 1 refills | Status: DC

## 2021-09-05 ENCOUNTER — Other Ambulatory Visit: Payer: Self-pay

## 2021-09-05 MED ORDER — AMLODIPINE-OLMESARTAN 10-40 MG PO TABS: 1.0000 | ORAL_TABLET | Freq: Every day | ORAL | 0 refills | Status: DC

## 2021-09-25 ENCOUNTER — Other Ambulatory Visit: Payer: Self-pay

## 2021-09-25 ENCOUNTER — Encounter: Payer: Self-pay | Admitting: Nurse Practitioner

## 2021-09-25 ENCOUNTER — Other Ambulatory Visit: Payer: Self-pay | Admitting: Internal Medicine

## 2021-09-25 ENCOUNTER — Ambulatory Visit (INDEPENDENT_AMBULATORY_CARE_PROVIDER_SITE_OTHER): Payer: 59 | Admitting: Nurse Practitioner

## 2021-09-25 VITALS — BP 124/80 | HR 95 | Temp 99.1°F | Ht 62.4 in | Wt 292.6 lb

## 2021-09-25 DIAGNOSIS — E1165 Type 2 diabetes mellitus with hyperglycemia: Secondary | ICD-10-CM

## 2021-09-25 DIAGNOSIS — Z6841 Body Mass Index (BMI) 40.0 and over, adult: Secondary | ICD-10-CM

## 2021-09-25 DIAGNOSIS — E66813 Obesity, class 3: Secondary | ICD-10-CM

## 2021-09-25 DIAGNOSIS — Z23 Encounter for immunization: Secondary | ICD-10-CM | POA: Diagnosis not present

## 2021-09-25 NOTE — Progress Notes (Signed)
I,Brandi Howell,acting as a Education administrator for [employer], FNP.,have documented all relevant documentation on the behalf of Brandi Brine, FNP,as directed by  Brandi Brine, FNP while in the presence of Brandi Howell, Brandi Howell.   This visit occurred during the SARS-CoV-2 public health emergency.  Safety protocols were in place, including screening questions prior to the visit, additional usage of staff PPE, and extensive cleaning of exam room while observing appropriate contact time as indicated for disinfecting solutions.  Subjective:     Patient ID: Brandi Howell , female    DOB: 08-31-1966 , 55 y.o.   MRN: [id number]   Chief Complaint  Patient presents with   Diabetes    HPI  The patient is here today for a diabetes/HTN  f/u.  She reports compliance with meds. She denies headaches, palpitations and shortness of breath. She had not had her Ozempic for one month just received on December 7th. She did not take for 2-3 weeks. She has been working with a Physiological scientist for 3 weeks. She is not on a meal plan.   Wt Readings from Last 3 Encounters: 09/25/21 : 292 lb 9.6 oz (132.7 kg) 03/24/21 : 286 lb 9.6 oz (130 kg) 12/23/20 : 293 lb 6.4 oz (133.1 kg)  Weight with the trainer she was 293.2 lbs on December 21st.    Diabetes She presents for her follow-up diabetic visit. She has type 2 diabetes mellitus. There are no hypoglycemic associated symptoms. Pertinent negatives for hypoglycemia include no dizziness or headaches. Associated symptoms include polyphagia. Pertinent negatives for diabetes include no chest pain, no polydipsia and no polyuria. There are no hypoglycemic complications. Risk factors for coronary artery disease include sedentary lifestyle, obesity, diabetes mellitus and hypertension. Current diabetic treatment includes oral agent (dual therapy) and oral agent (monotherapy). She is compliant with treatment most of the time. She has not had a previous visit with a dietitian. She  rarely participates in exercise. (122-190 (3 hours after eating she also had banana pudding after that time frame) blood sugar range) An ACE inhibitor/angiotensin II receptor blocker is being taken. She does not see a podiatrist.Eye exam is not current (she has an appt next month).    Past Medical History:  Diagnosis Date   Asthma    Diabetes mellitus without complication ([place])    Goiter    Gout    Hypercholesteremia    Hypertension    Vitamin D deficiency      Family History  Problem Relation Age of Onset   Hypertension Mother    Hyperlipidemia Mother    Heart disease Father      Current Outpatient Medications:    albuterol (VENTOLIN HFA) 108 (90 Base) MCG/ACT inhaler, Inhale 2 puffs into the lungs every 6 (six) hours as needed for wheezing or shortness of breath., Disp: , Rfl:    apixaban (ELIQUIS) 5 MG TABS tablet, Take 1 tablet (5 mg total) by mouth 2 (two) times daily., Disp: 180 tablet, Rfl: 0   budesonide-formoterol (SYMBICORT) 160-4.5 MCG/ACT inhaler, Inhale 2 puffs into the lungs as needed., Disp: , Rfl:    cetirizine (ZYRTEC) 10 MG tablet, Take 10 mg by mouth as needed for allergies. , Disp: , Rfl:    dapagliflozin propanediol (FARXIGA) 10 MG TABS tablet, Take 1 tablet (10 mg total) by mouth daily., Disp: 90 tablet, Rfl: 1   furosemide (LASIX) 80 MG tablet, Take 1 tablet (80 mg total) by mouth daily., Disp: 90 tablet, Rfl: 1   levocetirizine (XYZAL) 5  MG tablet, Take 1 tablet (5 mg total) by mouth every evening., Disp: 90 tablet, Rfl: 1   montelukast (SINGULAIR) 10 MG tablet, TAKE 1 TABLET BY MOUTH DAILY, Disp: 90 tablet, Rfl: 1   OXYGEN, at bedtime. 2lpm 24/7 AHC, Disp: , Rfl:    Semaglutide, 2 MG/DOSE, (OZEMPIC, 2 MG/DOSE,) 8 MG/3ML SOPN, Inject 2 mg into the skin once a week., Disp: 9 mL, Rfl: 3   spironolactone (ALDACTONE) 25 MG tablet, TAKE 1 TABLET BY MOUTH DAILY, Disp: 90 tablet, Rfl: 1   UNABLE TO FIND, Med Name: CPAP with o2 bled in 2lpm, Disp: , Rfl:     amLODipine-olmesartan (AZOR) 10-40 MG tablet, Take 1 tablet by mouth daily., Disp: 90 tablet, Rfl: 0   OZEMPIC, 1 MG/DOSE, 4 MG/3ML SOPN, INJECT 1 MG UNDER THE SKIN ONE DAY A WEEK, Disp: 3 mL, Rfl: 1   Pitavastatin Calcium (LIVALO) 4 MG TABS, Take 1 tablet (4 mg total) by mouth daily., Disp: 90 tablet, Rfl: 0   Allergies  Allergen Reactions   Lisinopril Cough   Statins Other (See Comments)    Cause muscle weakness     Review of Systems  Constitutional: Negative.   Cardiovascular:  Negative for chest pain, palpitations and leg swelling.  Endocrine: Positive for polyphagia. Negative for polydipsia and polyuria.  Neurological:  Negative for dizziness and headaches.  Psychiatric/Behavioral: Negative.      Today's Vitals   09/25/21 1602  BP: 124/80  Pulse: 95  Temp: 99.1 F (37.3 C)  Weight: 292 lb 9.6 oz (132.7 kg)  Height: 5' 2.4" (1.585 m)  PainSc: 0-No pain   Body mass index is 52.83 kg/m.   Objective:  Physical Exam Constitutional:      General: She is not in acute distress.    Appearance: Normal appearance. She is well-developed. She is obese.  Cardiovascular:     Rate and Rhythm: Normal rate and regular rhythm.     Pulses: Normal pulses.     Heart sounds: Normal heart sounds. No murmur heard. Pulmonary:     Effort: Pulmonary effort is normal. No respiratory distress.     Breath sounds: Normal breath sounds. No wheezing.  Chest:     Chest wall: No tenderness.  Skin:    General: Skin is warm and dry.     Capillary Refill: Capillary refill takes less than 2 seconds.  Neurological:     General: No focal deficit present.     Mental Status: She is alert and oriented to person, place, and time.     Cranial Nerves: No cranial nerve deficit.     Motor: No weakness.  Psychiatric:        Mood and Affect: Mood normal.        Behavior: Behavior normal.        Thought Content: Thought content normal.        Judgment: Judgment normal.        Assessment And Plan:      1. Uncontrolled type 2 diabetes mellitus with hyperglycemia ([place]) Comments: HgbA1c was slightly better at last visit, will recheck today.  - Hemoglobin A1c - BMP8+eGFR  2. Class 3 severe obesity due to excess calories with serious comorbidity and body mass index (BMI) of 50.0 to 59.9 in adult [facility]) Chronic Discussed healthy diet and regular exercise options  Encouraged to exercise at least 150 minutes per week with 2 days of strength training  3. Immunization due Influenza vaccine administered Encouraged to take Tylenol as needed  for fever or muscle aches. - Pneumococcal conjugate vaccine 20-valent (Prevnar-20) - Flu Vaccine QUAD 6+ mos PF IM (Fluarix Quad PF)     Patient was given opportunity to ask questions. Patient verbalized understanding of the plan and was able to repeat key elements of the plan. All questions were answered to their satisfaction.  Brandi Brine, FNP   I, Brandi Brine, FNP, have reviewed all documentation for this visit. The documentation on 09/25/21 for the exam, diagnosis, procedures, and orders are all accurate and complete.   IF YOU HAVE BEEN REFERRED TO A SPECIALIST, IT MAY TAKE 1-2 WEEKS TO SCHEDULE/PROCESS THE REFERRAL. IF YOU HAVE NOT HEARD FROM US/SPECIALIST IN TWO WEEKS, PLEASE GIVE [place] A CALL AT [phone].   THE PATIENT IS ENCOURAGED TO PRACTICE SOCIAL DISTANCING DUE TO THE COVID-19 PANDEMIC.

## 2021-09-25 NOTE — Patient Instructions (Signed)

## 2021-09-26 LAB — BMP8+EGFR
BUN/Creatinine Ratio: 22 (ref 9–23)
BUN: 17 mg/dL (ref 6–24)
CO2: 22 mmol/L (ref 20–29)
Calcium: 9.3 mg/dL (ref 8.7–10.2)
Chloride: 104 mmol/L (ref 96–106)
Creatinine, Ser: 0.79 mg/dL (ref 0.57–1.00)
Glucose: 196 mg/dL — ABNORMAL HIGH (ref 70–99)
Potassium: 4 mmol/L (ref 3.5–5.2)
Sodium: 143 mmol/L (ref 134–144)
eGFR: 89 mL/min/{1.73_m2} (ref >59)

## 2021-09-26 LAB — HEMOGLOBIN A1C
Est. average glucose Bld gHb Est-mCnc: 171 mg/dL
Hgb A1c MFr Bld: 7.6 % — ABNORMAL HIGH (ref 4.8–5.6)

## 2021-10-02 ENCOUNTER — Other Ambulatory Visit: Payer: Self-pay

## 2021-10-02 NOTE — Telephone Encounter (Signed)
I left the pt a message that the office needed her insurance benefits infomration to complete the priro auth for her ozempic, the office keeps getting a response that the current info on file isn't active.

## 2021-10-09 ENCOUNTER — Encounter: Payer: Self-pay | Admitting: Internal Medicine

## 2021-10-14 ENCOUNTER — Other Ambulatory Visit: Payer: Self-pay

## 2021-10-14 MED ORDER — AMLODIPINE-OLMESARTAN 10-40 MG PO TABS: 1.0000 | ORAL_TABLET | Freq: Every day | ORAL | 0 refills | Status: DC

## 2021-10-14 MED ORDER — LIVALO 4 MG PO TABS: 1.0000 | ORAL_TABLET | Freq: Every day | ORAL | 0 refills | Status: DC

## 2021-10-20 ENCOUNTER — Encounter: Payer: Self-pay | Admitting: Internal Medicine

## 2021-11-02 ENCOUNTER — Other Ambulatory Visit: Payer: Self-pay | Admitting: Internal Medicine

## 2021-11-10 ENCOUNTER — Other Ambulatory Visit: Payer: Self-pay

## 2021-11-10 MED ORDER — AMLODIPINE-OLMESARTAN 10-40 MG PO TABS: 1.0000 | ORAL_TABLET | Freq: Every day | ORAL | 2 refills | Status: DC

## 2021-12-01 ENCOUNTER — Other Ambulatory Visit: Payer: Self-pay

## 2021-12-19 ENCOUNTER — Other Ambulatory Visit: Payer: Self-pay | Admitting: Internal Medicine

## 2021-12-23 ENCOUNTER — Encounter: Payer: Self-pay | Admitting: Internal Medicine

## 2021-12-23 ENCOUNTER — Ambulatory Visit (INDEPENDENT_AMBULATORY_CARE_PROVIDER_SITE_OTHER): Payer: 59 | Admitting: Internal Medicine

## 2021-12-23 ENCOUNTER — Other Ambulatory Visit: Payer: Self-pay

## 2021-12-23 VITALS — BP 132/70 | HR 86 | Temp 98.6°F | Ht 63.0 in | Wt 289.4 lb

## 2021-12-23 DIAGNOSIS — E1165 Type 2 diabetes mellitus with hyperglycemia: Secondary | ICD-10-CM | POA: Diagnosis not present

## 2021-12-23 DIAGNOSIS — E042 Nontoxic multinodular goiter: Secondary | ICD-10-CM | POA: Diagnosis not present

## 2021-12-23 DIAGNOSIS — I110 Hypertensive heart disease with heart failure: Secondary | ICD-10-CM

## 2021-12-23 DIAGNOSIS — E66813 Obesity, class 3: Secondary | ICD-10-CM

## 2021-12-23 DIAGNOSIS — I5032 Chronic diastolic (congestive) heart failure: Secondary | ICD-10-CM

## 2021-12-23 DIAGNOSIS — Z0000 Encounter for general adult medical examination without abnormal findings: Secondary | ICD-10-CM | POA: Diagnosis not present

## 2021-12-23 DIAGNOSIS — Z23 Encounter for immunization: Secondary | ICD-10-CM

## 2021-12-23 DIAGNOSIS — Z6841 Body Mass Index (BMI) 40.0 and over, adult: Secondary | ICD-10-CM

## 2021-12-23 LAB — POCT URINALYSIS DIPSTICK
Bilirubin, UA: NEGATIVE
Blood, UA: NEGATIVE
Glucose, UA: POSITIVE — AB
Ketones, UA: NEGATIVE
Leukocytes, UA: NEGATIVE
Nitrite, UA: NEGATIVE
Protein, UA: NEGATIVE
Spec Grav, UA: 1.01 (ref 1.010–1.025)
Urobilinogen, UA: 0.2 E.U./dL
pH, UA: 6 (ref 5.0–8.0)

## 2021-12-23 MED ORDER — CONTOUR NEXT TEST VI STRP
1.0000 | ORAL_STRIP | 11 refills | Status: DC | PRN
Start: 2021-12-23 — End: 2024-07-13

## 2021-12-23 MED ORDER — MICROLET LANCETS MISC
11 refills | Status: AC
Start: 2021-12-23 — End: ?

## 2021-12-23 MED ORDER — TETANUS-DIPHTH-ACELL PERTUSSIS 5-2.5-18.5 LF-MCG/0.5 IM SUSY
0.5000 mL | PREFILLED_SYRINGE | Freq: Once | INTRAMUSCULAR | Status: AC
  Administered 2021-12-23: 0.5 mL via INTRAMUSCULAR

## 2021-12-23 NOTE — Patient Instructions (Signed)

## 2021-12-23 NOTE — Progress Notes (Signed)
?Brandi Howell,acting as a Neurosurgeon for Brandi Aliment, MD.,have documented all relevant documentation on the behalf of Brandi Aliment, MD,as directed by  Brandi Aliment, MD while in the presence of Brandi Aliment, MD.  ?This visit occurred during the SARS-CoV-2 public health emergency.  Safety protocols were in place, including screening questions prior to the visit, additional usage of staff PPE, and extensive cleaning of exam room while observing appropriate contact time as indicated for disinfecting solutions. ? ?Subjective:  ?  ? Patient ID: Brandi Howell , female    DOB: 1966-04-30 , 56 y.o.   MRN: [id number] ? ? ?Chief Complaint  ?Patient presents with  ? Annual Exam  ? ? ?HPI ? ?She is here today for a full physical examination. She is followed by Dr. Jaymes Graff for her pelvic exams. She reports compliance with meds. She denies headaches, chest pain and shortness of breath. She has no specific concerns or complaints at this time.  ? ?Diabetes ?She presents for her follow-up diabetic visit. She has type 2 diabetes mellitus. There are no hypoglycemic associated symptoms. Pertinent negatives for diabetes include no blurred vision and no chest pain. There are no hypoglycemic complications. Risk factors for coronary artery disease include diabetes mellitus, dyslipidemia, hypertension, obesity and sedentary lifestyle. She is following a diabetic diet. She participates in exercise intermittently. Eye exam is not current.  ?Hypertension ?This is a chronic problem. The current episode started more than 1 month ago. The problem has been gradually improving since onset. The problem is controlled. Pertinent negatives include no blurred vision, chest pain or palpitations. Risk factors for coronary artery disease include sedentary lifestyle, diabetes mellitus, dyslipidemia, obesity and post-menopausal state. The current treatment provides moderate improvement. Hypertensive end-organ damage includes heart  failure.   ? ?Past Medical History:  ?Diagnosis Date  ? Asthma   ? Diabetes mellitus without complication (HCC)   ? Goiter   ? Gout   ? Hypercholesteremia   ? Hypertension   ? Vitamin D deficiency   ?  ? ?Family History  ?Problem Relation Age of Onset  ? Hypertension Mother   ? Hyperlipidemia Mother   ? Heart disease Father   ? ? ? ?Current Outpatient Medications:  ?  albuterol (VENTOLIN HFA) 108 (90 Base) MCG/ACT inhaler, Inhale 2 puffs into the lungs every 6 (six) hours as needed for wheezing or shortness of breath., Disp: , Rfl:  ?  budesonide-formoterol (SYMBICORT) 160-4.5 MCG/ACT inhaler, Inhale 2 puffs into the lungs as needed., Disp: , Rfl:  ?  cetirizine (ZYRTEC) 10 MG tablet, Take 10 mg by mouth as needed for allergies. , Disp: , Rfl:  ?  dapagliflozin propanediol (FARXIGA) 10 MG TABS tablet, Take 1 tablet (10 mg total) by mouth daily., Disp: 90 tablet, Rfl: 1 ?  ELIQUIS 5 MG TABS tablet, TAKE 1 TABLET BY MOUTH TWICE  DAILY, Disp: 180 tablet, Rfl: 3 ?  furosemide (LASIX) 80 MG tablet, Take 1 tablet (80 mg total) by mouth daily., Disp: 90 tablet, Rfl: 1 ?  levocetirizine (XYZAL) 5 MG tablet, Take 1 tablet (5 mg total) by mouth every evening., Disp: 90 tablet, Rfl: 1 ?  LIVALO 4 MG TABS, TAKE 1 TABLET BY MOUTH DAILY, Disp: 90 tablet, Rfl: 3 ?  montelukast (SINGULAIR) 10 MG tablet, TAKE 1 TABLET BY MOUTH DAILY, Disp: 90 tablet, Rfl: 1 ?  OXYGEN, at bedtime. 2lpm 24/7 AHC, Disp: , Rfl:  ?  Semaglutide, 2 MG/DOSE, (OZEMPIC, 2 MG/DOSE,) 8 MG/3ML  SOPN, Inject 2 mg into the skin once a week., Disp: 9 mL, Rfl: 3 ?  spironolactone (ALDACTONE) 25 MG tablet, TAKE 1 TABLET BY MOUTH DAILY, Disp: 90 tablet, Rfl: 1 ?  UNABLE TO FIND, Med Name: CPAP with o2 bled in 2lpm, Disp: , Rfl:  ?  amLODipine-olmesartan (AZOR) 10-40 MG tablet, Take 1 tablet by mouth daily., Disp: 90 tablet, Rfl: 2 ?  glucose blood (CONTOUR NEXT TEST) test strip, 1 each by Other route as needed for other. Use as instructed to check blood sugars daily  dx: e11.9, Disp: 100 each, Rfl: 11 ?  Microlet Lancets MISC, Use as instructed to check blood sugars daily dx: e11.9, Disp: 100 each, Rfl: 11  ? ?Allergies  ?Allergen Reactions  ? Lisinopril Cough  ? Statins Other (See Comments)  ?  Cause muscle weakness  ?  ? ? ?The patient states she uses post menopausal status for birth control. Last LMP was No LMP recorded.. Negative for Dysmenorrhea. Negative for: breast discharge, breast lump(s), breast pain and breast self exam. Associated symptoms include abnormal vaginal bleeding. Pertinent negatives include abnormal bleeding (hematology), anxiety, decreased libido, depression, difficulty falling sleep, dyspareunia, history of infertility, nocturia, sexual dysfunction, sleep disturbances, urinary incontinence, urinary urgency, vaginal discharge and vaginal itching. Diet regular.The patient states her exercise level is  minimal. ? . The patient's tobacco use is:  ?Social History  ? ?Tobacco Use  ?Smoking Status Never  ?Smokeless Tobacco Never  ?Marland Kitchen. She has been exposed to passive smoke. The patient's alcohol use is:  ?Social History  ? ?Substance and Sexual Activity  ?Alcohol Use Yes  ? Comment: occ  ? ? ?Review of Systems  ?Constitutional: Negative.   ?HENT: Negative.    ?Eyes: Negative.  Negative for blurred vision.  ?Respiratory: Negative.    ?Cardiovascular: Negative.  Negative for chest pain and palpitations.  ?Gastrointestinal: Negative.   ?Endocrine: Negative.   ?Genitourinary: Negative.   ?Musculoskeletal: Negative.   ?Skin: Negative.   ?Allergic/Immunologic: Negative.   ?Neurological: Negative.   ?Hematological: Negative.   ?Psychiatric/Behavioral: Negative.     ? ?Today's Vitals  ? 12/23/21 0911  ?BP: 132/70  ?Pulse: 86  ?Temp: 98.6 ?F (37 ?C)  ?Weight: 289 lb 6.4 oz (131.3 kg)  ?Height: 5\' 3"  (1.6 m)  ?PainSc: 0-No pain  ? ?Body mass index is 51.26 kg/m?.  ?Wt Readings from Last 3 Encounters:  ?12/23/21 289 lb 6.4 oz (131.3 kg)  ?09/25/21 292 lb 9.6 oz (132.7 kg)   ?03/24/21 286 lb 9.6 oz (130 kg)  ?  ? ?Objective:  ?Physical Exam ?Constitutional:   ?   General: She is not in acute distress. ?   Appearance: Normal appearance. She is well-developed. She is obese.  ?HENT:  ?   Head: Normocephalic and atraumatic.  ?   Right Ear: Hearing, tympanic membrane, ear canal and external ear normal. There is no impacted cerumen.  ?   Left Ear: Hearing, tympanic membrane, ear canal and external ear normal. There is no impacted cerumen.  ?   Nose:  ?   Comments: Deferred - masked ?   Mouth/Throat:  ?   Comments: Deferred - masked ?Eyes:  ?   General: Lids are normal.  ?   Extraocular Movements: Extraocular movements intact.  ?   Conjunctiva/sclera: Conjunctivae normal.  ?   Pupils: Pupils are equal, round, and reactive to light.  ?   Funduscopic exam: ?   Right eye: No papilledema.     ?  Left eye: No papilledema.  ?Neck:  ?   Thyroid: Thyromegaly present. No thyroid mass.  ?   Vascular: No carotid bruit.  ?   Comments: Large neck circumference ?Cardiovascular:  ?   Rate and Rhythm: Normal rate and regular rhythm.  ?   Pulses: Normal pulses.  ?   Heart sounds: Normal heart sounds. No murmur heard. ?Pulmonary:  ?   Effort: Pulmonary effort is normal.  ?   Breath sounds: Normal breath sounds.  ?Chest:  ?   Chest wall: No mass.  ?Breasts: ?   Tanner Score is 5.  ?   Right: No mass or tenderness.  ?   Left: No mass or tenderness.  ?   Comments: She elected to keep her bra on ? ?Hyperpigmentation beneath both breasts ?Abdominal:  ?   General: Bowel sounds are normal. There is no distension.  ?   Palpations: Abdomen is soft.  ?   Tenderness: There is no abdominal tenderness.  ?   Comments: Obese, soft  Difficult to assess organomegaly  ?Genitourinary: ?   Rectum: Guaiac result negative.  ?Musculoskeletal:     ?   General: No swelling. Normal range of motion.  ?   Cervical back: Full passive range of motion without pain, normal range of motion and neck supple.  ?   Right lower leg: No edema.  ?    Left lower leg: No edema.  ?Lymphadenopathy:  ?   Upper Body:  ?   Right upper body: No supraclavicular, axillary or pectoral adenopathy.  ?   Left upper body: No supraclavicular, axillary or pectoral

## 2021-12-24 ENCOUNTER — Other Ambulatory Visit: Payer: Self-pay

## 2021-12-24 LAB — CMP14+EGFR
ALT: 27 IU/L (ref 0–32)
AST: 17 IU/L (ref 0–40)
Albumin/Globulin Ratio: 1.7 (ref 1.2–2.2)
Albumin: 4.7 g/dL (ref 3.8–4.9)
Alkaline Phosphatase: 89 IU/L (ref 44–121)
BUN/Creatinine Ratio: 16 (ref 9–23)
BUN: 14 mg/dL (ref 6–24)
Bilirubin Total: 0.3 mg/dL (ref 0.0–1.2)
CO2: 25 mmol/L (ref 20–29)
Calcium: 9.9 mg/dL (ref 8.7–10.2)
Chloride: 101 mmol/L (ref 96–106)
Creatinine, Ser: 0.9 mg/dL (ref 0.57–1.00)
Globulin, Total: 2.7 g/dL (ref 1.5–4.5)
Glucose: 123 mg/dL — ABNORMAL HIGH (ref 70–99)
Potassium: 3.9 mmol/L (ref 3.5–5.2)
Sodium: 141 mmol/L (ref 134–144)
Total Protein: 7.4 g/dL (ref 6.0–8.5)
eGFR: 75 mL/min/{1.73_m2} (ref >59)

## 2021-12-24 LAB — CBC
Hematocrit: 39.6 % (ref 34.0–46.6)
Hemoglobin: 12.7 g/dL (ref 11.1–15.9)
MCH: 26 pg — ABNORMAL LOW (ref 26.6–33.0)
MCHC: 32.1 g/dL (ref 31.5–35.7)
MCV: 81 fL (ref 79–97)
Platelets: 213 10*3/uL (ref 150–450)
RBC: 4.88 x10E6/uL (ref 3.77–5.28)
RDW: 15.2 % (ref 11.7–15.4)
WBC: 6.8 10*3/uL (ref 3.4–10.8)

## 2021-12-24 LAB — LIPID PANEL
Chol/HDL Ratio: 3.2 ratio (ref 0.0–4.4)
Cholesterol, Total: 152 mg/dL (ref 100–199)
HDL: 47 mg/dL (ref >39)
LDL Chol Calc (NIH): 83 mg/dL (ref 0–99)
Triglycerides: 121 mg/dL (ref 0–149)
VLDL Cholesterol Cal: 22 mg/dL (ref 5–40)

## 2021-12-24 LAB — MICROALBUMIN / CREATININE URINE RATIO
Creatinine, Urine: 7.7 mg/dL
Microalbumin, Urine: <3 ug/mL

## 2021-12-24 LAB — HEMOGLOBIN A1C
Est. average glucose Bld gHb Est-mCnc: 171 mg/dL
Hgb A1c MFr Bld: 7.6 % — ABNORMAL HIGH (ref 4.8–5.6)

## 2021-12-24 MED ORDER — AMLODIPINE-OLMESARTAN 10-40 MG PO TABS: 1.0000 | ORAL_TABLET | Freq: Every day | ORAL | 2 refills | Status: DC

## 2021-12-25 ENCOUNTER — Encounter: Payer: Self-pay | Admitting: Internal Medicine

## 2021-12-28 ENCOUNTER — Other Ambulatory Visit: Payer: Self-pay | Admitting: Internal Medicine

## 2021-12-29 ENCOUNTER — Other Ambulatory Visit: Payer: 59

## 2021-12-31 ENCOUNTER — Ambulatory Visit
Admission: RE | Admit: 2021-12-31 | Discharge: 2021-12-31 | Disposition: A | Discharge status: Home / Self Care | Payer: 59 | Source: Ambulatory Visit | Attending: Internal Medicine | Admitting: Internal Medicine

## 2021-12-31 DIAGNOSIS — E042 Nontoxic multinodular goiter: Secondary | ICD-10-CM

## 2022-01-11 ENCOUNTER — Other Ambulatory Visit: Payer: Self-pay | Admitting: Cardiology

## 2022-01-11 ENCOUNTER — Other Ambulatory Visit: Payer: Self-pay | Admitting: Internal Medicine

## 2022-01-12 NOTE — Telephone Encounter (Signed)
Rx(s) sent to pharmacy electronically.  

## 2022-01-20 ENCOUNTER — Telehealth: Payer: Self-pay

## 2022-01-20 NOTE — Telephone Encounter (Signed)
PA for Wilder Glade has been submitted through covermymeds and we are just waiting on the determination. YL,RMA ?

## 2022-01-26 ENCOUNTER — Other Ambulatory Visit: Payer: Self-pay | Admitting: Internal Medicine

## 2022-01-28 ENCOUNTER — Telehealth: Payer: Self-pay

## 2022-01-28 NOTE — Telephone Encounter (Signed)
I called and left pt vm to let her know that her PA for farxiga has been approved YL,RMA ?

## 2022-02-17 ENCOUNTER — Other Ambulatory Visit: Payer: Self-pay

## 2022-02-17 MED ORDER — MONTELUKAST SODIUM 10 MG PO TABS: 10.0000 mg | ORAL_TABLET | Freq: Every day | ORAL | 1 refills | Status: DC

## 2022-02-17 MED ORDER — AMLODIPINE-OLMESARTAN 10-40 MG PO TABS: 1.0000 | ORAL_TABLET | Freq: Every day | ORAL | 2 refills | Status: DC

## 2022-02-19 ENCOUNTER — Other Ambulatory Visit: Payer: Self-pay

## 2022-02-19 MED ORDER — SPIRONOLACTONE 25 MG PO TABS: 25.0000 mg | ORAL_TABLET | Freq: Every day | ORAL | 0 refills | Status: DC

## 2022-04-08 ENCOUNTER — Other Ambulatory Visit: Payer: Self-pay | Admitting: Cardiology

## 2022-04-08 NOTE — Telephone Encounter (Signed)
Please call pt to schedule overdue appt for refills. Thank you!

## 2022-04-08 NOTE — Telephone Encounter (Signed)
Rx(s) sent to pharmacy electronically.  

## 2022-04-14 ENCOUNTER — Telehealth (HOSPITAL_BASED_OUTPATIENT_CLINIC_OR_DEPARTMENT_OTHER): Payer: Self-pay | Admitting: Cardiology

## 2022-04-14 NOTE — Telephone Encounter (Signed)
Left message for patient to call and schedule over due follow up appt with Dr. Cristal Deer or APP (to refill meds)

## 2022-04-17 ENCOUNTER — Telehealth (HOSPITAL_BASED_OUTPATIENT_CLINIC_OR_DEPARTMENT_OTHER): Payer: Self-pay | Admitting: Cardiology

## 2022-04-17 NOTE — Telephone Encounter (Signed)
Left message 04/14/22 and 04/17/22 for patient to call and schedule overdue follow up with Dr. Cristal Deer

## 2022-04-25 ENCOUNTER — Other Ambulatory Visit: Payer: Self-pay | Admitting: Cardiology

## 2022-04-27 ENCOUNTER — Encounter: Payer: Self-pay | Admitting: Internal Medicine

## 2022-04-27 ENCOUNTER — Ambulatory Visit (INDEPENDENT_AMBULATORY_CARE_PROVIDER_SITE_OTHER): Payer: 59 | Admitting: Internal Medicine

## 2022-04-27 ENCOUNTER — Other Ambulatory Visit: Payer: Self-pay | Admitting: Cardiology

## 2022-04-27 VITALS — BP 130/82 | HR 84 | Temp 98.4°F | Ht 62.2 in | Wt 292.2 lb

## 2022-04-27 DIAGNOSIS — Z6841 Body Mass Index (BMI) 40.0 and over, adult: Secondary | ICD-10-CM

## 2022-04-27 DIAGNOSIS — I5032 Chronic diastolic (congestive) heart failure: Secondary | ICD-10-CM

## 2022-04-27 DIAGNOSIS — J9612 Chronic respiratory failure with hypercapnia: Secondary | ICD-10-CM

## 2022-04-27 DIAGNOSIS — J9611 Chronic respiratory failure with hypoxia: Secondary | ICD-10-CM | POA: Diagnosis not present

## 2022-04-27 DIAGNOSIS — E1165 Type 2 diabetes mellitus with hyperglycemia: Secondary | ICD-10-CM | POA: Diagnosis not present

## 2022-04-27 DIAGNOSIS — I110 Hypertensive heart disease with heart failure: Secondary | ICD-10-CM

## 2022-04-27 DIAGNOSIS — Z23 Encounter for immunization: Secondary | ICD-10-CM

## 2022-04-27 NOTE — Telephone Encounter (Signed)
Request for Spironolactone denied pt is overdue for followup appt.  Message left for pt to call the office.

## 2022-04-27 NOTE — Patient Instructions (Signed)

## 2022-04-27 NOTE — Progress Notes (Signed)
Rich Brave [place],acting as a Education administrator for Maximino Greenland, MD.,have documented all relevant documentation on the behalf of Maximino Greenland, MD,as directed by  Maximino Greenland, MD while in the presence of Maximino Greenland, MD.    Subjective:     Patient ID: Brandi Howell , female    DOB: 02-05-1966 , 56 y.o.   MRN: [id number]   Chief Complaint  Patient presents with   Diabetes   Hypertension    HPI  Patient presents today for a diabetes and bp check. Patient reports compliance with her meds.  She reports compliance with meds. She denies headaches, chest pain and shortness of breath. She reports compliance with CPAP, nocturnal O2. States her sugars are between 100-140, mostly between 120-140 in the mornings.    Diabetes She presents for her follow-up diabetic visit. She has type 2 diabetes mellitus. There are no hypoglycemic associated symptoms. Pertinent negatives for hypoglycemia include no dizziness or headaches. Pertinent negatives for diabetes include no blurred vision, no chest pain, no polydipsia, no polyphagia and no polyuria. There are no hypoglycemic complications. Risk factors for coronary artery disease include sedentary lifestyle, obesity, diabetes mellitus and hypertension. Current diabetic treatment includes oral agent (dual therapy) and oral agent (monotherapy). She is compliant with treatment most of the time. She is following a diabetic diet. She has not had a previous visit with a dietitian. She rarely participates in exercise. (122-190 (3 hours after eating she also had banana pudding after that time frame) blood sugar range) An ACE inhibitor/angiotensin II receptor blocker is being taken. She does not see a podiatrist.Eye exam is not current (she has an appt next month).  Hypertension This is a chronic problem. The current episode started more than 1 year ago. The problem has been gradually improving since onset. The problem is controlled. Pertinent negatives include  no blurred vision, chest pain, headaches, palpitations or shortness of breath. Hypertensive end-organ damage includes heart failure.     Past Medical History:  Diagnosis Date   Asthma    Diabetes mellitus without complication ([place])    Goiter    Gout    Hypercholesteremia    Hypertension    Vitamin D deficiency      Family History  Problem Relation Age of Onset   Hypertension Mother    Hyperlipidemia Mother    Heart disease Father      Current Outpatient Medications:    albuterol (VENTOLIN HFA) 108 (90 Base) MCG/ACT inhaler, Inhale 2 puffs into the lungs every 6 (six) hours as needed for wheezing or shortness of breath., Disp: , Rfl:    amLODipine-olmesartan (AZOR) 10-40 MG tablet, Take 1 tablet by mouth daily., Disp: 90 tablet, Rfl: 2   budesonide-formoterol (SYMBICORT) 160-4.5 MCG/ACT inhaler, Inhale 2 puffs into the lungs as needed., Disp: , Rfl:    cetirizine (ZYRTEC) 10 MG tablet, Take 10 mg by mouth as needed for allergies. , Disp: , Rfl:    ELIQUIS 5 MG TABS tablet, TAKE 1 TABLET BY MOUTH TWICE  DAILY, Disp: 180 tablet, Rfl: 3   FARXIGA 10 MG TABS tablet, TAKE 1 TABLET BY MOUTH DAILY, Disp: 90 tablet, Rfl: 3   furosemide (LASIX) 80 MG tablet, TAKE 1 TABLET BY MOUTH DAILY, Disp: 90 tablet, Rfl: 3   glucose blood (CONTOUR NEXT TEST) test strip, 1 each by Other route as needed for other. Use as instructed to check blood sugars daily dx: e11.9, Disp: 100 each, Rfl: 11   levocetirizine (XYZAL) 5  MG tablet, Take 1 tablet (5 mg total) by mouth every evening., Disp: 90 tablet, Rfl: 1   LIVALO 4 MG TABS, TAKE 1 TABLET BY MOUTH DAILY, Disp: 90 tablet, Rfl: 3   Microlet Lancets MISC, Use as instructed to check blood sugars daily dx: e11.9, Disp: 100 each, Rfl: 11   montelukast (SINGULAIR) 10 MG tablet, Take 1 tablet (10 mg total) by mouth daily., Disp: 90 tablet, Rfl: 1   OXYGEN, at bedtime. 2lpm 24/7 AHC, Disp: , Rfl:    Semaglutide, 2 MG/DOSE, (OZEMPIC, 2 MG/DOSE,) 8 MG/3ML SOPN,  Inject 2 mg into the skin once a week., Disp: 9 mL, Rfl: 3   spironolactone (ALDACTONE) 25 MG tablet, Take 1 tablet (25 mg total) by mouth daily. NEED APPOINTMENT, Disp: 15 tablet, Rfl: 0   UNABLE TO FIND, Med Name: CPAP with o2 bled in 2lpm, Disp: , Rfl:    Allergies  Allergen Reactions   Lisinopril Cough   Statins Other (See Comments)    Cause muscle weakness     Review of Systems  Constitutional: Negative.   Eyes: Negative.  Negative for blurred vision.  Respiratory: Negative.  Negative for shortness of breath.   Cardiovascular: Negative.  Negative for chest pain and palpitations.  Endocrine: Negative for polydipsia, polyphagia and polyuria.  Musculoskeletal: Negative.   Skin: Negative.   Neurological: Negative.  Negative for dizziness and headaches.  Psychiatric/Behavioral: Negative.       Today's Vitals   04/27/22 0920  BP: 130/82  Pulse: 84  Temp: 98.4 F (36.9 C)  Weight: 292 lb 3.2 oz (132.5 kg)  Height: 5' 2.2" (1.58 m)  PainSc: 0-No pain   Body mass index is 53.1 kg/m.  Wt Readings from Last 3 Encounters:  04/27/22 292 lb 3.2 oz (132.5 kg)  12/23/21 289 lb 6.4 oz (131.3 kg)  09/25/21 292 lb 9.6 oz (132.7 kg)     Objective:  Physical Exam Vitals and nursing note reviewed.  Constitutional:      Appearance: Normal appearance. She is obese.  HENT:     Head: Normocephalic and atraumatic.  Eyes:     Extraocular Movements: Extraocular movements intact.  Cardiovascular:     Rate and Rhythm: Normal rate and regular rhythm.     Heart sounds: Normal heart sounds.  Pulmonary:     Effort: Pulmonary effort is normal.     Breath sounds: Normal breath sounds.  Musculoskeletal:     Cervical back: Normal range of motion.  Skin:    General: Skin is warm.  Neurological:     General: No focal deficit present.     Mental Status: She is alert.  Psychiatric:        Mood and Affect: Mood normal.        Behavior: Behavior normal.      Assessment And Plan:     1.  Uncontrolled type 2 diabetes mellitus with hyperglycemia (HCC) Comments: Chronic, I will check labs as below. I may to add metformin to her current regimen to improve glycemic control. If added, she agrees to rto in 1 month for BMP. - Urine microalbumin-creatinine with uACR - BMP8+EGFR - Hemoglobin A1c  2. Hypertensive heart disease with chronic diastolic congestive heart failure (HCC) Comments: Chronic, controlled.  She will c/w Azor and spironolactone, encouraged to follow low sodium diet.   3. Chronic respiratory failure with hypoxia and hypercapnia (HCC) Comments: She reports compliance with CPAP and nocturnal oxygen.   4. Class 3 severe obesity due to excess  calories with serious comorbidity and body mass index (BMI) of 50.0 to 59.9 in adult [facility]) Comments: She has gained 3 lbs since March 2023, encouraged to incorporate more exercise into her daily routine.   5. Immunization due - Varicella-zoster vaccine IM   Patient was given opportunity to ask questions. Patient verbalized understanding of the plan and was able to repeat key elements of the plan. All questions were answered to their satisfaction.   I, Maximino Greenland, MD, have reviewed all documentation for this visit. The documentation on 04/27/22 for the exam, diagnosis, procedures, and orders are all accurate and complete.   IF YOU HAVE BEEN REFERRED TO A SPECIALIST, IT MAY TAKE 1-2 WEEKS TO SCHEDULE/PROCESS THE REFERRAL. IF YOU HAVE NOT HEARD FROM US/SPECIALIST IN TWO WEEKS, PLEASE GIVE [place] A CALL AT [phone].   THE PATIENT IS ENCOURAGED TO PRACTICE SOCIAL DISTANCING DUE TO THE COVID-19 PANDEMIC.

## 2022-04-28 ENCOUNTER — Other Ambulatory Visit: Payer: Self-pay | Admitting: Cardiology

## 2022-04-28 LAB — MICROALBUMIN / CREATININE URINE RATIO
Creatinine, Urine: 55.4 mg/dL
Microalb/Creat Ratio: <5 mg/g creat (ref 0–29)
Microalbumin, Urine: <3 ug/mL

## 2022-04-28 LAB — BMP8+EGFR
BUN/Creatinine Ratio: 16 (ref 9–23)
BUN: 13 mg/dL (ref 6–24)
CO2: 22 mmol/L (ref 20–29)
Calcium: 9.5 mg/dL (ref 8.7–10.2)
Chloride: 102 mmol/L (ref 96–106)
Creatinine, Ser: 0.8 mg/dL (ref 0.57–1.00)
Glucose: 129 mg/dL — ABNORMAL HIGH (ref 70–99)
Potassium: 4.2 mmol/L (ref 3.5–5.2)
Sodium: 141 mmol/L (ref 134–144)
eGFR: 87 mL/min/{1.73_m2} (ref >59)

## 2022-04-28 LAB — HEMOGLOBIN A1C
Est. average glucose Bld gHb Est-mCnc: 180 mg/dL
Hgb A1c MFr Bld: 7.9 % — ABNORMAL HIGH (ref 4.8–5.6)

## 2022-04-28 NOTE — Telephone Encounter (Signed)
Rx request sent to pharmacy.  

## 2022-05-15 ENCOUNTER — Other Ambulatory Visit: Payer: Self-pay | Admitting: Internal Medicine

## 2022-05-19 LAB — HM DIABETES EYE EXAM

## 2022-06-04 ENCOUNTER — Other Ambulatory Visit: Payer: Self-pay | Admitting: Cardiology

## 2022-06-04 NOTE — Telephone Encounter (Signed)
Rx request sent to pharmacy.  

## 2022-06-15 ENCOUNTER — Ambulatory Visit (HOSPITAL_BASED_OUTPATIENT_CLINIC_OR_DEPARTMENT_OTHER): Payer: 59 | Admitting: Cardiology

## 2022-07-13 ENCOUNTER — Ambulatory Visit (INDEPENDENT_AMBULATORY_CARE_PROVIDER_SITE_OTHER): Payer: 59 | Admitting: Cardiology

## 2022-07-13 ENCOUNTER — Encounter (HOSPITAL_BASED_OUTPATIENT_CLINIC_OR_DEPARTMENT_OTHER): Payer: Self-pay | Admitting: Cardiology

## 2022-07-13 VITALS — BP 124/68 | HR 80 | Ht 62.2 in | Wt 292.3 lb

## 2022-07-13 DIAGNOSIS — I5032 Chronic diastolic (congestive) heart failure: Secondary | ICD-10-CM

## 2022-07-13 DIAGNOSIS — E7800 Pure hypercholesterolemia, unspecified: Secondary | ICD-10-CM

## 2022-07-13 DIAGNOSIS — Z86711 Personal history of pulmonary embolism: Secondary | ICD-10-CM

## 2022-07-13 DIAGNOSIS — I10 Essential (primary) hypertension: Secondary | ICD-10-CM

## 2022-07-13 DIAGNOSIS — E1169 Type 2 diabetes mellitus with other specified complication: Secondary | ICD-10-CM | POA: Diagnosis not present

## 2022-07-13 NOTE — Progress Notes (Signed)
Cardiology Office Note:    Date:  07/13/2022   ID:  Ashok Pall, DOB [birth year], MRN [id number]  PCP:  Glendale Chard, MD  Cardiologist:  Buford Dresser, MD PhD  Referring MD: Glendale Chard, MD   CC: follow up   History of Present Illness:    Brandi Howell is a 56 y.o. female with a hx of diastolic heart failure, prior pulmonary embolism who is seen in follow up for the evaluation and management of shortness of breath and chest pain.  She was initially seen by me on 04/07/18. She had recently been hospitalized in 01/2018 with blood clots in the lungs, pneumonia, and heart failure. She also endorsed left chest tightness intermittently. She deferred further workup of chest pain at that time.  Dropped to one a day apixaban some time ago, had been on this for some time. She was unsure at what time she was told to do this. Reviewed the guidelines, recommendation is for BID dosing (though can be 5 mg or 2.5 mg BID based on patient factors). She will discuss this with Dr. Baird Cancer. Never seen by hematology. Cleared by pulmonology, does not need routine follow up. No further clots. No bleeding issues.   Today:  She says she has been doing well.   She reports that her breathing has been fine. 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  She recently had three teeth removed and was in bad pain. Her blood pressure was elevated during that time, but has been normal otherwise.   We reviewed her medications. She is doing well overall. She continues to take apixaban only once a day. Reviewed that this isn't therapeutic--needs to be twice daily dosing. Had extensive conversation, see below  She denies any palpitations, chest pain, or peripheral edema. No lightheadedness, headaches, syncope, orthopnea, or PND.  Past Medical History:  Diagnosis Date    Asthma    Diabetes mellitus without complication ([place])    Goiter    Gout    Hypercholesteremia    Hypertension    Vitamin D deficiency     Past Surgical History:  Procedure Laterality Date   CHOLECYSTECTOMY     KNEE ARTHROSCOPY     KNEE ARTHROSCOPY Left 10/25/2017   Procedure: LEFT KNEE ARTHROSCOPY WITH MEDIAL MENISECTOMY, CHONDROPLASTY;  Surgeon: Frederik Pear, MD;  Location: [facility];  Service: Orthopedics;  Laterality: Left;   TUBAL LIGATION      Current Medications: Current Outpatient Medications on File Prior to Visit  Medication Sig   albuterol (VENTOLIN HFA) 108 (90 Base) MCG/ACT inhaler Inhale 2 puffs into the lungs every 6 (six) hours as needed for wheezing or shortness of breath.   amLODipine-olmesartan (AZOR) 10-40 MG tablet Take 1 tablet by mouth daily.   budesonide-formoterol (SYMBICORT) 160-4.5 MCG/ACT inhaler Inhale 2 puffs into the lungs as needed.   cetirizine (ZYRTEC) 10 MG tablet Take 10 mg by mouth as needed for allergies.    ELIQUIS 5 MG TABS tablet TAKE 1 TABLET BY MOUTH TWICE  DAILY   FARXIGA 10 MG TABS tablet TAKE 1 TABLET BY MOUTH DAILY   furosemide (LASIX) 40 MG tablet Take 40 mg by mouth daily.   glucose blood (CONTOUR NEXT TEST) test strip 1 each by Other route as needed for other. Use as instructed to check blood sugars daily dx: e11.9   levocetirizine (XYZAL) 5  MG tablet Take 1 tablet (5 mg total) by mouth every evening.   LIVALO 4 MG TABS TAKE 1 TABLET BY MOUTH DAILY   Microlet Lancets MISC Use as instructed to check blood sugars daily dx: e11.9   montelukast (SINGULAIR) 10 MG tablet Take 1 tablet (10 mg total) by mouth daily.   OXYGEN at bedtime. 2lpm 24/7 AHC   OZEMPIC, 2 MG/DOSE, 8 MG/3ML SOPN INJECT 2 MG UNDER THE SKIN ONCE A WEEK   spironolactone (ALDACTONE) 25 MG tablet Take 1 tablet (25 mg total) by mouth daily. Please keep your upcoming appointment for refills.   UNABLE TO FIND Med Name: CPAP with o2 bled in 2lpm   No current  facility-administered medications on file prior to visit.     Allergies:   Lisinopril and Statins   Social History   Tobacco Use   Smoking status: Never   Smokeless tobacco: Never  Vaping Use   Vaping Use: Never used  Substance Use Topics   Alcohol use: Yes    Comment: occ   Drug use: No    Family History: The patient's Family history: gma died at age 51 from MI. That gma's sister had AAA. Father has 2 prior aortic valve surgeries, died at age 5. Brother died at age 55 of an MI. Mother with cholesterol and high blood pressure.  ROS:   Please see the history of present illness.   Additional pertinent ROS otherwise unremarkable.  EKGs/Labs/Other Studies Reviewed:    The following studies were reviewed today:  Echo 02/20/18: Study Conclusions   - Procedure narrative: Transthoracic echocardiography. Image   quality was suboptimal. Technically difficult study. - Left ventricle: The cavity size was normal. Wall thickness was   increased in a pattern of moderate LVH. Systolic function was   normal. The estimated ejection fraction was in the range of 60%   to 65%. Wall motion was normal; there were no regional wall   motion abnormalities. Doppler parameters are consistent with   abnormal left ventricular relaxation (grade 1 diastolic   dysfunction). The Ee&' ratio is >15, suggesting elevated LV   filling pressure. - Left atrium: The atrium was normal in size. - Inferior vena cava: The vessel was dilated. The respirophasic   diameter changes were blunted (< 50%), consistent with elevated   central venous pressure.   Impressions:  - Technically difficult study. 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  CTA 02/20/18: FINDINGS: Cardiovascular: Satisfactory opacification of the pulmonary arteries to the segmental level. Small nonocclusive pulmonary emboli within segmental and subsegmental branches of the basilar segments of  the left lower lobe. Suspected subsegmental nonocclusive pulmonary emboli in the right lower lobe. No evidence of right heart strain. No pericardial effusion. Grossly normal thoracic aorta.   Mediastinum/Nodes: No enlarged mediastinal, hilar, or axillary lymph nodes. Thyroid gland, trachea, and esophagus demonstrate no significant findings.   Lungs/Pleura: Bilateral patchy areas of airspace consolidation throughout both lungs with upper lobe predominance.   Upper Abdomen: No acute abnormality.   Musculoskeletal: No chest wall abnormality. No acute or significant osseous findings.   Review of the MIP images confirms the above findings.   IMPRESSION: Scattered nonobstructive pulmonary emboli within segmental and subsegmental branches of the basilar segments of the left lower lobe.   Suspected subsegmental nonocclusive pulmonary emboli in the right lower lobe.   No evidence of right heart strain.   Multifocal bilateral airspace consolidation with upper lobe predominance.  In the appropriate clinical setting, this likely represents multifocal pneumonia. Some of the peripheral areas of consolidation may potentially represent pulmonary infarctions, however the degree and distribution of airspace consolidation is out of proportion to the small peripheral pulmonary emboli seen, and therefore infectious etiology is favored.  EKG:  EKG is personally reviewed.   07/13/22: NSR at 80 bpm, nonspecific st pattern 12/17/20: normal sinus rhythm, LVH with repol abnormalities.  Recent Labs: 12/23/2021: ALT 27; Hemoglobin 12.7; Platelets 213 04/27/2022: BUN 13; Creatinine, Ser 0.80; Potassium 4.2; Sodium 141  Recent Lipid Panel    Component Value Date/Time   CHOL 152 12/23/2021 0954   TRIG 121 12/23/2021 0954   HDL 47 12/23/2021 0954   CHOLHDL 3.2 12/23/2021 0954   LDLCALC 83 12/23/2021 0954    Physical Exam:    VS:  BP 124/68 (BP Location: Left Arm, Patient Position: Sitting, Cuff  Size: Large)   Pulse 80   Ht 5' 2.2" (1.58 m)   Wt 292 lb 4.8 oz (132.6 kg)   BMI 53.12 kg/m     Wt Readings from Last 3 Encounters:  07/13/22 292 lb 4.8 oz (132.6 kg)  04/27/22 292 lb 3.2 oz (132.5 kg)  12/23/21 289 lb 6.4 oz (131.3 kg)    GEN: Well nourished, well developed in no acute distress HEENT: Normal, moist mucous membranes NECK: No JVD CARDIAC: regular rhythm, normal S1 and S2, no rubs or gallops. 1/6 systolic murmur. VASCULAR: Radial and DP pulses 2+ bilaterally. No carotid bruits RESPIRATORY:  Clear to auscultation without rales, wheezing or rhonchi  ABDOMEN: Soft, non-tender, non-distended MUSCULOSKELETAL:  Ambulates independently SKIN: Warm and dry, no edema NEUROLOGIC:  Alert and oriented x 3. No focal neuro deficits noted. PSYCHIATRIC:  Normal affect   ASSESSMENT:    1. Chronic diastolic heart failure ([place])   2. Essential hypertension   3. Type 2 diabetes mellitus with hypercholesterolemia ([place])   4. History of pulmonary embolism     PLAN:    Prior DVT/PE: we reviewed her anticoagulation. She has been on apixaban once a day for some time. We discussed therapeutic dosing. I would defer to Dr. Baird Cancer, as she has had only one incidence of VTE but no clear provoking factor. Would recommend either taking twice daily dosing of apixaban to be therapeutic, or discussing with Dr. Baird Cancer stopping it entirely. As she has not been taking it therapeutically, and has not had further clots, stopping anticoagulation is a reasonable option. She will think about this and let me know what they decide. In the interim, she will try taking it twice daily.   Chronic diastolic heart failure Type II diabetes not on insulin Hypercholesterolemia -appears euvolemic today -continue furosemide, spironolactone, dapagliflozin.  -tolerating semaglutide 2 mg /week -continue pitavastatin. Reports issues on prior statins -no aspirin while she is on apixaban. If she stops apixaban, would  start aspirin 81 mg daily given diabetes  Hypertension -continue amlodipine 10mg -olmesartan 40 mg combo pill.  -furosemide and spironolactone as above -goal <130/80, at goal today  Obesity -working on weight loss -BMI ~53 today  sleep apnea:  -on CPAP  ASCVD prevention and risk factor management: -recommend heart healthy/Mediterranean diet, with whole grains, fruits, vegetable, fish, lean meats, nuts, and olive oil. Limit salt. -recommend moderate walking, 3-5 times/week for 30-50 minutes each session. Aim for at least 150 minutes.week. Goal should be pace of 3 miles/hours, or walking 1.5 miles in 30 minutes -recommend avoidance of tobacco products. Avoid excess alcohol.  The 10-year ASCVD risk  score (Arnett DK, et al., 2019) is: 9.3%   Values used to calculate the score:     Age: 67 years     Sex: Female     Is Non-Hispanic African American: Yes     Diabetic: Yes     Tobacco smoker: No     Systolic Blood Pressure: 419 mmHg     Is BP treated: Yes     HDL Cholesterol: 47 mg/dL     Total Cholesterol: 152 mg/dL  Follow up: 1 year.  Buford Dresser, MD, PhD, [facility]   Medication Adjustments/Labs and Tests Ordered: Current medicines are reviewed at length with the patient today.  Concerns regarding medicines are outlined above.  Orders Placed This Encounter  Procedures   EKG 12-Lead   No orders of the defined types were placed in this encounter.  Patient Instructions  Medication Instructions:  Your Physician recommend you continue on your current medication as directed.    *If you need a refill on your cardiac medications before your next appointment, please call your pharmacy*   Lab Work: None ordered today   Testing/Procedures: Non ordered today   Follow-Up: At [facility], you and your health needs are our priority.  As part of our continuing mission to provide you with exceptional heart care, we have created  designated Provider Care Teams.  These Care Teams include your primary Cardiologist (physician) and Advanced Practice Providers (APPs -  Physician Assistants and Nurse Practitioners) who all work together to provide you with the care you need, when you need it.  We recommend signing up for the patient portal called "MyChart".  Sign up information is provided on this After Visit Summary.  MyChart is used to connect with patients for Virtual Visits (Telemedicine).  Patients are able to view lab/test results, encounter notes, upcoming appointments, etc.  Non-urgent messages can be sent to your provider as well.   To learn more about what you can do with MyChart, go to [email or web address].    Your next appointment:   1 year(s)  The format for your next appointment:   In Person  Provider:   Buford Dresser, MD             I,Breanna Adamick,acting as a scribe for Buford Dresser, MD.,have documented all relevant documentation on the behalf of Buford Dresser, MD,as directed by  Buford Dresser, MD while in the presence of Buford Dresser, MD.  I, Buford Dresser, MD, have reviewed all documentation for this visit. The documentation on 07/13/22 for the exam, diagnosis, procedures, and orders are all accurate and complete.   Signed, Buford Dresser, MD PhD 07/13/2022    [place]

## 2022-07-13 NOTE — Patient Instructions (Signed)
Medication Instructions:  Your Physician recommend you continue on your current medication as directed.    *If you need a refill on your cardiac medications before your next appointment, please call your pharmacy*   Lab Work: None ordered today   Testing/Procedures: Non ordered today   Follow-Up: At [facility], you and your health needs are our priority.  As part of our continuing mission to provide you with exceptional heart care, we have created designated Provider Care Teams.  These Care Teams include your primary Cardiologist (physician) and Advanced Practice Providers (APPs -  Physician Assistants and Nurse Practitioners) who all work together to provide you with the care you need, when you need it.  We recommend signing up for the patient portal called "MyChart".  Sign up information is provided on this After Visit Summary.  MyChart is used to connect with patients for Virtual Visits (Telemedicine).  Patients are able to view lab/test results, encounter notes, upcoming appointments, etc.  Non-urgent messages can be sent to your provider as well.   To learn more about what you can do with MyChart, go to [email or web address].    Your next appointment:   1 year(s)  The format for your next appointment:   In Person  Provider:   Buford Dresser, MD

## 2022-07-31 ENCOUNTER — Other Ambulatory Visit: Payer: Self-pay | Admitting: Internal Medicine

## 2022-08-31 ENCOUNTER — Encounter: Payer: 59 | Admitting: Internal Medicine

## 2022-08-31 NOTE — Progress Notes (Deleted)
Jeri Cos Howell,acting as a Neurosurgeon for Brandi Aliment, MD.,have documented all relevant documentation on the behalf of Brandi Aliment, MD,as directed by  Brandi Aliment, MD while in the presence of Brandi Aliment, MD.    Subjective:     Patient ID: Brandi Howell , female    DOB: 1965/12/06 , 56 y.o.   MRN: [id number]   Chief Complaint  Patient presents with   Diabetes   Hypertension    HPI  Patient presents today for a diabetes and bp check. Patient reports compliance with her meds.     Diabetes She presents for her follow-up diabetic visit. She has type 2 diabetes mellitus. There are no hypoglycemic associated symptoms. Pertinent negatives for hypoglycemia include no dizziness or headaches. Pertinent negatives for diabetes include no blurred vision, no chest pain, no polydipsia, no polyphagia and no polyuria. There are no hypoglycemic complications. Risk factors for coronary artery disease include sedentary lifestyle, obesity, diabetes mellitus and hypertension. Current diabetic treatment includes oral agent (dual therapy) and oral agent (monotherapy). She is compliant with treatment most of the time. She is following a diabetic diet. She has not had a previous visit with a dietitian. She rarely participates in exercise. (122-190 (3 hours after eating she also had banana pudding after that time frame) blood sugar range) An ACE inhibitor/angiotensin II receptor blocker is being taken. She does not see a podiatrist.Eye exam is not current (she has an appt next month).  Hypertension This is a chronic problem. The current episode started more than 1 year ago. The problem has been gradually improving since onset. The problem is controlled. Pertinent negatives include no blurred vision, chest pain, headaches, palpitations or shortness of breath. Hypertensive end-organ damage includes heart failure.     Past Medical History:  Diagnosis Date   Asthma    Diabetes mellitus without  complication (HCC)    Goiter    Gout    Hypercholesteremia    Hypertension    Vitamin D deficiency      Family History  Problem Relation Age of Onset   Hypertension Mother    Hyperlipidemia Mother    Heart disease Father      Current Outpatient Medications:    albuterol (VENTOLIN HFA) 108 (90 Base) MCG/ACT inhaler, Inhale 2 puffs into the lungs every 6 (six) hours as needed for wheezing or shortness of breath., Disp: , Rfl:    amLODipine-olmesartan (AZOR) 10-40 MG tablet, Take 1 tablet by mouth daily., Disp: 90 tablet, Rfl: 2   budesonide-formoterol (SYMBICORT) 160-4.5 MCG/ACT inhaler, Inhale 2 puffs into the lungs as needed., Disp: , Rfl:    cetirizine (ZYRTEC) 10 MG tablet, Take 10 mg by mouth as needed for allergies. , Disp: , Rfl:    ELIQUIS 5 MG TABS tablet, TAKE 1 TABLET BY MOUTH TWICE  DAILY, Disp: 180 tablet, Rfl: 3   FARXIGA 10 MG TABS tablet, TAKE 1 TABLET BY MOUTH DAILY, Disp: 90 tablet, Rfl: 3   furosemide (LASIX) 40 MG tablet, Take 40 mg by mouth daily., Disp: , Rfl:    glucose blood (CONTOUR NEXT TEST) test strip, 1 each by Other route as needed for other. Use as instructed to check blood sugars daily dx: e11.9, Disp: 100 each, Rfl: 11   levocetirizine (XYZAL) 5 MG tablet, Take 1 tablet (5 mg total) by mouth every evening., Disp: 90 tablet, Rfl: 1   LIVALO 4 MG TABS, TAKE 1 TABLET BY MOUTH DAILY, Disp: 90 tablet, Rfl: 3  Microlet Lancets MISC, Use as instructed to check blood sugars daily dx: e11.9, Disp: 100 each, Rfl: 11   montelukast (SINGULAIR) 10 MG tablet, TAKE 1 TABLET BY MOUTH DAILY, Disp: 90 tablet, Rfl: 3   OXYGEN, at bedtime. 2lpm 24/7 AHC, Disp: , Rfl:    OZEMPIC, 2 MG/DOSE, 8 MG/3ML SOPN, INJECT 2 MG UNDER THE SKIN ONCE A WEEK, Disp: 9 mL, Rfl: 3   spironolactone (ALDACTONE) 25 MG tablet, Take 1 tablet (25 mg total) by mouth daily. Please keep your upcoming appointment for refills., Disp: 90 tablet, Rfl: 0   UNABLE TO FIND, Med Name: CPAP with o2 bled in  2lpm, Disp: , Rfl:    Allergies  Allergen Reactions   Lisinopril Cough   Statins Other (See Comments)    Cause muscle weakness     Review of Systems  Constitutional: Negative.   Eyes: Negative.  Negative for blurred vision.  Respiratory:  Negative for shortness of breath.   Cardiovascular:  Negative for chest pain and palpitations.  Endocrine: Negative for polydipsia, polyphagia and polyuria.  Musculoskeletal: Negative.   Skin: Negative.   Neurological:  Negative for dizziness and headaches.  Psychiatric/Behavioral: Negative.       There were no vitals filed for this visit. There is no height or weight on file to calculate BMI.   Objective:  Physical Exam      Assessment And Plan:     1. Uncontrolled type 2 diabetes mellitus with hyperglycemia (HCC)  2. Hypertensive heart disease with chronic diastolic congestive heart failure [facility])     Patient was given opportunity to ask questions. Patient verbalized understanding of the plan and was able to repeat key elements of the plan. All questions were answered to their satisfaction.  Brandi Howell, CMA   I, Brandi Howell, CMA, have reviewed all documentation for this visit. The documentation on 08/31/22 for the exam, diagnosis, procedures, and orders are all accurate and complete.   IF YOU HAVE BEEN REFERRED TO A SPECIALIST, IT MAY TAKE 1-2 WEEKS TO SCHEDULE/PROCESS THE REFERRAL. IF YOU HAVE NOT HEARD FROM US/SPECIALIST IN TWO WEEKS, PLEASE GIVE [place] A CALL AT [phone].   THE PATIENT IS ENCOURAGED TO PRACTICE SOCIAL DISTANCING DUE TO THE COVID-19 PANDEMIC.

## 2022-09-01 ENCOUNTER — Other Ambulatory Visit: Payer: Self-pay | Admitting: Internal Medicine

## 2022-09-04 ENCOUNTER — Other Ambulatory Visit: Payer: Self-pay | Admitting: Cardiology

## 2022-09-06 NOTE — Progress Notes (Signed)
No show

## 2022-09-07 NOTE — Telephone Encounter (Signed)
Rx request sent to pharmacy.  

## 2022-10-26 ENCOUNTER — Ambulatory Visit: Payer: 59 | Admitting: Internal Medicine

## 2022-10-26 ENCOUNTER — Encounter: Payer: Self-pay | Admitting: Internal Medicine

## 2022-10-26 VITALS — BP 132/80 | HR 104 | Temp 98.6°F | Ht 62.2 in | Wt 281.8 lb

## 2022-10-26 DIAGNOSIS — I110 Hypertensive heart disease with heart failure: Secondary | ICD-10-CM

## 2022-10-26 DIAGNOSIS — I5032 Chronic diastolic (congestive) heart failure: Secondary | ICD-10-CM | POA: Diagnosis not present

## 2022-10-26 DIAGNOSIS — E1169 Type 2 diabetes mellitus with other specified complication: Secondary | ICD-10-CM

## 2022-10-26 DIAGNOSIS — E66813 Obesity, class 3: Secondary | ICD-10-CM

## 2022-10-26 DIAGNOSIS — R102 Pelvic and perineal pain unspecified side: Secondary | ICD-10-CM

## 2022-10-26 DIAGNOSIS — E785 Hyperlipidemia, unspecified: Secondary | ICD-10-CM | POA: Diagnosis not present

## 2022-10-26 DIAGNOSIS — Z6841 Body Mass Index (BMI) 40.0 and over, adult: Secondary | ICD-10-CM

## 2022-10-26 DIAGNOSIS — Z1211 Encounter for screening for malignant neoplasm of colon: Secondary | ICD-10-CM

## 2022-10-26 DIAGNOSIS — Z86711 Personal history of pulmonary embolism: Secondary | ICD-10-CM

## 2022-10-26 NOTE — Patient Instructions (Signed)

## 2022-10-26 NOTE — Progress Notes (Signed)
Rich Brave [place],acting as a Education administrator for Maximino Greenland, MD.,have documented all relevant documentation on the behalf of Maximino Greenland, MD,as directed by  Maximino Greenland, MD while in the presence of Maximino Greenland, MD.    Subjective:     Patient ID: Brandi Howell , female    DOB: 02-26-1966 , 57 y.o.   MRN: [id number]   Chief Complaint  Patient presents with   Diabetes   Hypertension    HPI  Patient presents today for a diabetes and bp check. Patient reports compliance with her meds.  She reports compliance with meds. She denies headaches, chest pain and shortness of breath.   Diabetes She presents for her follow-up diabetic visit. She has type 2 diabetes mellitus. There are no hypoglycemic associated symptoms. Pertinent negatives for hypoglycemia include no dizziness or headaches. Pertinent negatives for diabetes include no blurred vision, no chest pain, no polydipsia, no polyphagia and no polyuria. There are no hypoglycemic complications. Risk factors for coronary artery disease include sedentary lifestyle, obesity, diabetes mellitus and hypertension. Current diabetic treatment includes oral agent (dual therapy) and oral agent (monotherapy). She is compliant with treatment most of the time. She is following a diabetic diet. She has not had a previous visit with a dietitian. She rarely participates in exercise. (122-190 (3 hours after eating she also had banana pudding after that time frame) blood sugar range) An ACE inhibitor/angiotensin II receptor blocker is being taken. She does not see a podiatrist.Eye exam is not current (she has an appt next month).  Hypertension This is a chronic problem. The current episode started more than 1 year ago. The problem has been gradually improving since onset. The problem is controlled. Pertinent negatives include no blurred vision, chest pain, headaches, palpitations or shortness of breath. Hypertensive end-organ damage includes heart  failure.     Past Medical History:  Diagnosis Date   Asthma    Diabetes mellitus without complication ([place])    Goiter    Gout    Hypercholesteremia    Hypertension    Vitamin D deficiency      Family History  Problem Relation Age of Onset   Hypertension Mother    Hyperlipidemia Mother    Heart disease Father      Current Outpatient Medications:    albuterol (VENTOLIN HFA) 108 (90 Base) MCG/ACT inhaler, Inhale 2 puffs into the lungs every 6 (six) hours as needed for wheezing or shortness of breath., Disp: , Rfl:    amLODipine-olmesartan (AZOR) 10-40 MG tablet, TAKE 1 TABLET BY MOUTH DAILY, Disp: 90 tablet, Rfl: 2   budesonide-formoterol (SYMBICORT) 160-4.5 MCG/ACT inhaler, Inhale 2 puffs into the lungs as needed., Disp: , Rfl:    cetirizine (ZYRTEC) 10 MG tablet, Take 10 mg by mouth as needed for allergies. , Disp: , Rfl:    dapagliflozin propanediol (FARXIGA) 10 MG TABS tablet, TAKE 1 TABLET(10 MG) BY MOUTH DAILY BEFORE BREAKFAST, Disp: 30 tablet, Rfl: 1   ELIQUIS 5 MG TABS tablet, TAKE 1 TABLET BY MOUTH TWICE  DAILY, Disp: 180 tablet, Rfl: 3   furosemide (LASIX) 40 MG tablet, Take 40 mg by mouth daily., Disp: , Rfl:    glucose blood (CONTOUR NEXT TEST) test strip, 1 each by Other route as needed for other. Use as instructed to check blood sugars daily dx: e11.9, Disp: 100 each, Rfl: 11   levocetirizine (XYZAL) 5 MG tablet, Take 1 tablet (5 mg total) by mouth every evening., Disp: 90 tablet, Rfl:  1   LIVALO 4 MG TABS, TAKE 1 TABLET BY MOUTH DAILY, Disp: 90 tablet, Rfl: 3   Microlet Lancets MISC, Use as instructed to check blood sugars daily dx: e11.9, Disp: 100 each, Rfl: 11   montelukast (SINGULAIR) 10 MG tablet, TAKE 1 TABLET BY MOUTH DAILY, Disp: 90 tablet, Rfl: 3   OXYGEN, at bedtime. 2lpm 24/7 AHC, Disp: , Rfl:    OZEMPIC, 2 MG/DOSE, 8 MG/3ML SOPN, INJECT 2 MG UNDER THE SKIN ONCE A WEEK, Disp: 9 mL, Rfl: 3   spironolactone (ALDACTONE) 25 MG tablet, TAKE 1 TABLET BY MOUTH  DAILY, Disp: 90 tablet, Rfl: 1   UNABLE TO FIND, Med Name: CPAP with o2 bled in 2lpm, Disp: , Rfl:    Allergies  Allergen Reactions   Lisinopril Cough   Statins Other (See Comments)    Cause muscle weakness     Review of Systems  Constitutional: Negative.   Eyes: Negative.  Negative for blurred vision.  Respiratory: Negative.  Negative for shortness of breath.   Cardiovascular: Negative.  Negative for chest pain and palpitations.  Gastrointestinal: Negative.   Endocrine: Negative for polydipsia, polyphagia and polyuria.  Genitourinary:        She c/o pelvic pain. She has been evaluated by GYN. She was scheduled for u/s; however, wanted to see if I could get her in sooner elsewhere. She is not sure what is contributing to her sx. 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 Therefore, she does more lifting than she has done in the past.   Musculoskeletal: Negative.   Skin: Negative.   Neurological: Negative.  Negative for dizziness and headaches.  Psychiatric/Behavioral: Negative.       Today's Vitals   10/26/22 1206  BP: 132/80  Pulse: (!) 104  Temp: 98.6 F (37 C)  Weight: 281 lb 12.8 oz (127.8 kg)  Height: 5' 2.2" (1.58 m)  PainSc: 0-No pain   Body mass index is 51.21 kg/m.  Wt Readings from Last 3 Encounters:  10/26/22 281 lb 12.8 oz (127.8 kg)  07/13/22 292 lb 4.8 oz (132.6 kg)  04/27/22 292 lb 3.2 oz (132.5 kg)     Objective:  Physical Exam Vitals and nursing note reviewed.  Constitutional:      Appearance: Normal appearance. She is obese.  HENT:     Head: Normocephalic and atraumatic.     Nose:     Comments: Masked     Mouth/Throat:     Comments: Masked  Eyes:     Extraocular Movements: Extraocular movements intact.  Cardiovascular:     Rate and Rhythm: Normal rate and regular rhythm.     Heart sounds: Normal heart sounds.  Pulmonary:     Effort: Pulmonary effort is normal.     Breath sounds: Normal breath  sounds.  Abdominal:     General: Bowel sounds are normal.     Palpations: Abdomen is soft.     Comments: Obese, No epigastric tenderness. Tenderness to palpation in both RLQ/LLQ. NO rebound or guarding  Musculoskeletal:     Cervical back: Normal range of motion.  Skin:    General: Skin is warm.  Neurological:     General: No focal deficit present.     Mental Status: She is alert.  Psychiatric:        Mood and Affect: Mood normal.        Behavior: Behavior normal.      Assessment And Plan:  1. Hyperlipidemia associated with type 2 diabetes mellitus (HCC) Comments: Chronic, LDL goal <70.  She is on Livalo 4mg  daily. She is encouraged to start checking her sugars daily. - Hemoglobin A1c - CMP14+EGFR  2. Hypertensive heart disease with chronic diastolic congestive heart failure (HCC) Comments: Chronic, fair control. Goal BP<130/80.She will c/w amlodipine/olmesartan, furosemide and spironolactone. Encouraged to resume her exercise regimen. - CBC no Diff  3. Pelvic pain Comments: I will request GYN eval. I will refer her for pelvic/TV u/s. There is a chance this is muscular since she has been lifting. - Pelvic Complete With Transvaginal; Future  4. Class 3 severe obesity due to excess calories with serious comorbidity and body mass index (BMI) of 50.0 to 59.9 in adult [facility]) Comments: BMI 51. She is encouraged to aim for at least 150 minutes of exercise per week.  5. Screen for colon cancer Comments: She agrees to Cologuard testing. She does not wish to pursue colonoscopy. - Cologuard  6. History of pulmonary embolism Comments: She has only been taking apaxiban  once daily; therefore, subtherapeutic. Will stop meds for next 30 days. Will let me know if she develops calf pain/sob.   Patient was given opportunity to ask questions. Patient verbalized understanding of the plan and was able to repeat key elements of the plan. All questions were answered to their satisfaction.    I, [facility], MD, have reviewed all documentation for this visit. The documentation on 10/26/22 for the exam, diagnosis, procedures, and orders are all accurate and complete.   IF YOU HAVE BEEN REFERRED TO A SPECIALIST, IT MAY TAKE 1-2 WEEKS TO SCHEDULE/PROCESS THE REFERRAL. IF YOU HAVE NOT HEARD FROM US/SPECIALIST IN TWO WEEKS, PLEASE GIVE 10/28/22 A CALL AT [phone].   THE PATIENT IS ENCOURAGED TO PRACTICE SOCIAL DISTANCING DUE TO THE COVID-19 PANDEMIC.

## 2022-10-27 LAB — CBC
Hematocrit: 40.5 % (ref 34.0–46.6)
Hemoglobin: 12.9 g/dL (ref 11.1–15.9)
MCH: 25.6 pg — ABNORMAL LOW (ref 26.6–33.0)
MCHC: 31.9 g/dL (ref 31.5–35.7)
MCV: 80 fL (ref 79–97)
Platelets: 211 10*3/uL (ref 150–450)
RBC: 5.04 x10E6/uL (ref 3.77–5.28)
RDW: 14.3 % (ref 11.7–15.4)
WBC: 6.9 10*3/uL (ref 3.4–10.8)

## 2022-10-27 LAB — HEMOGLOBIN A1C
Est. average glucose Bld gHb Est-mCnc: 177 mg/dL
Hgb A1c MFr Bld: 7.8 % — ABNORMAL HIGH (ref 4.8–5.6)

## 2022-10-27 LAB — CMP14+EGFR
ALT: 29 IU/L (ref 0–32)
AST: 18 IU/L (ref 0–40)
Albumin/Globulin Ratio: 1.9 (ref 1.2–2.2)
Albumin: 4.8 g/dL (ref 3.8–4.9)
Alkaline Phosphatase: 94 IU/L (ref 44–121)
BUN/Creatinine Ratio: 19 (ref 9–23)
BUN: 15 mg/dL (ref 6–24)
Bilirubin Total: 0.4 mg/dL (ref 0.0–1.2)
CO2: 24 mmol/L (ref 20–29)
Calcium: 10.1 mg/dL (ref 8.7–10.2)
Chloride: 103 mmol/L (ref 96–106)
Creatinine, Ser: 0.81 mg/dL (ref 0.57–1.00)
Globulin, Total: 2.5 g/dL (ref 1.5–4.5)
Glucose: 102 mg/dL — ABNORMAL HIGH (ref 70–99)
Potassium: 4 mmol/L (ref 3.5–5.2)
Sodium: 143 mmol/L (ref 134–144)
Total Protein: 7.3 g/dL (ref 6.0–8.5)
eGFR: 86 mL/min/{1.73_m2} (ref >59)

## 2022-11-02 ENCOUNTER — Ambulatory Visit
Admission: RE | Admit: 2022-11-02 | Discharge: 2022-11-02 | Disposition: A | Discharge status: Home / Self Care | Payer: 59 | Source: Ambulatory Visit | Attending: Internal Medicine | Admitting: Internal Medicine

## 2022-11-02 DIAGNOSIS — R102 Pelvic and perineal pain: Secondary | ICD-10-CM

## 2022-11-09 ENCOUNTER — Ambulatory Visit: Payer: Self-pay | Admitting: Internal Medicine

## 2022-11-20 ENCOUNTER — Telehealth: Payer: Self-pay

## 2022-11-20 NOTE — Telephone Encounter (Signed)
Called patient about her PA that wouldn't go through her insurance. After speaking with [employer] they state that her PA will not go through or even verify information due to her services ending on the 11/26/2022. [employer] insurance states they don't have an continuing date for her insurance so the PA will no go through considering it ends on the 11/26/2022 and the PA approval could take up to 15 days.  Patient needs to provide new insurance or update payment on [employer] them PA for [employer] can be done.

## 2022-11-25 LAB — COLOGUARD: COLOGUARD: NEGATIVE

## 2022-12-22 ENCOUNTER — Other Ambulatory Visit: Payer: Self-pay | Admitting: Internal Medicine

## 2022-12-23 ENCOUNTER — Other Ambulatory Visit: Payer: Self-pay

## 2023-01-18 ENCOUNTER — Encounter: Payer: 59 | Admitting: Internal Medicine

## 2023-01-22 ENCOUNTER — Other Ambulatory Visit: Payer: Self-pay

## 2023-01-27 ENCOUNTER — Other Ambulatory Visit: Payer: Self-pay

## 2023-02-08 ENCOUNTER — Ambulatory Visit: Payer: 59 | Admitting: Internal Medicine

## 2023-02-19 ENCOUNTER — Encounter: Payer: Self-pay | Admitting: Internal Medicine

## 2023-02-25 ENCOUNTER — Other Ambulatory Visit: Payer: Self-pay

## 2023-02-25 MED ORDER — WEGOVY 0.5 MG/0.5ML ~~LOC~~ SOAJ: 0.5000 mg | SUBCUTANEOUS | 0 refills | Status: DC

## 2023-03-04 ENCOUNTER — Ambulatory Visit: Payer: Commercial Managed Care - HMO | Admitting: Internal Medicine

## 2023-03-06 ENCOUNTER — Other Ambulatory Visit: Payer: Self-pay | Admitting: Cardiovascular Disease

## 2023-03-06 ENCOUNTER — Other Ambulatory Visit: Payer: Self-pay | Admitting: Internal Medicine

## 2023-03-08 NOTE — Telephone Encounter (Signed)
Rx request sent to pharmacy.  

## 2023-03-09 ENCOUNTER — Ambulatory Visit: Payer: Commercial Managed Care - HMO | Admitting: Internal Medicine

## 2023-03-09 ENCOUNTER — Encounter: Payer: Self-pay | Admitting: Internal Medicine

## 2023-03-09 VITALS — BP 134/76 | HR 81 | Temp 98.1°F | Ht 62.0 in | Wt 286.8 lb

## 2023-03-09 DIAGNOSIS — Z23 Encounter for immunization: Secondary | ICD-10-CM

## 2023-03-09 DIAGNOSIS — I5032 Chronic diastolic (congestive) heart failure: Secondary | ICD-10-CM

## 2023-03-09 DIAGNOSIS — E785 Hyperlipidemia, unspecified: Secondary | ICD-10-CM | POA: Diagnosis not present

## 2023-03-09 DIAGNOSIS — I110 Hypertensive heart disease with heart failure: Secondary | ICD-10-CM | POA: Diagnosis not present

## 2023-03-09 DIAGNOSIS — E1169 Type 2 diabetes mellitus with other specified complication: Secondary | ICD-10-CM | POA: Diagnosis not present

## 2023-03-09 DIAGNOSIS — Z6841 Body Mass Index (BMI) 40.0 and over, adult: Secondary | ICD-10-CM

## 2023-03-09 MED ORDER — WEGOVY 1 MG/0.5ML ~~LOC~~ SOAJ: 1.0000 mg | SUBCUTANEOUS | 0 refills | Status: DC

## 2023-03-09 MED ORDER — DAPAGLIFLOZIN PROPANEDIOL 10 MG PO TABS: ORAL_TABLET | ORAL | 5 refills | Status: DC

## 2023-03-09 NOTE — Progress Notes (Signed)
Subjective:  Patient ID: Brandi Howell , female    DOB: 10/01/1965 , 57 y.o.   MRN: [id number]  Chief Complaint  Patient presents with   Diabetes   Hypertension    HPI  Patient presents today for a diabetes and bp check. Patient reports compliance with her meds.  She reports compliance with meds. She denies headaches, chest pain and shortness of breath. She has a new insurance and unfortunately, we have not been able to get the Ozempic covered. However, her insurance does cover [phone]. She is currently on the 0.5mg  dose, no issues. She was also able to fill the [place] prescription.     Diabetes She presents for her follow-up diabetic visit. She has type 2 diabetes mellitus. There are no hypoglycemic associated symptoms. Pertinent negatives for hypoglycemia include no dizziness or headaches. Pertinent negatives for diabetes include no blurred vision, no chest pain, no polydipsia, no polyphagia and no polyuria. There are no hypoglycemic complications. Risk factors for coronary artery disease include sedentary lifestyle, obesity, diabetes mellitus and hypertension. Current diabetic treatment includes oral agent (dual therapy) and oral agent (monotherapy). She is compliant with treatment most of the time. She is following a diabetic diet. She has not had a previous visit with a dietitian. She rarely participates in exercise. (122-190 (3 hours after eating she also had banana pudding after that time frame) blood sugar range) An ACE inhibitor/angiotensin II receptor blocker is being taken. She does not see a podiatrist.Eye exam is not current (she has an appt next month).  Hypertension This is a chronic problem. The current episode started more than 1 year ago. The problem has been gradually improving since onset. The problem is controlled. Pertinent negatives include no blurred vision, chest pain, headaches, palpitations or shortness of breath. Hypertensive end-organ damage includes heart  failure. Identifiable causes of hypertension include sleep apnea.     Past Medical History:  Diagnosis Date   Asthma    Diabetes mellitus without complication (HCC)    Goiter    Gout    Hypercholesteremia    Hypertension    Vitamin D deficiency      Family History  Problem Relation Age of Onset   Hypertension Mother    Hyperlipidemia Mother    Heart disease Father      Current Outpatient Medications:    albuterol (VENTOLIN HFA) 108 (90 Base) MCG/ACT inhaler, Inhale 2 puffs into the lungs every 6 (six) hours as needed for wheezing or shortness of breath., Disp: , Rfl:    amLODipine-olmesartan (AZOR) 10-40 MG tablet, TAKE 1 TABLET BY MOUTH DAILY, Disp: 90 tablet, Rfl: 2   budesonide-formoterol (SYMBICORT) 160-4.5 MCG/ACT inhaler, Inhale 2 puffs into the lungs as needed., Disp: , Rfl:    cetirizine (ZYRTEC) 10 MG tablet, Take 10 mg by mouth as needed for allergies. , Disp: , Rfl:    furosemide (LASIX) 40 MG tablet, Take 40 mg by mouth daily., Disp: , Rfl:    glucose blood (CONTOUR NEXT TEST) test strip, 1 each by Other route as needed for other. Use as instructed to check blood sugars daily dx: e11.9, Disp: 100 each, Rfl: 11   levocetirizine (XYZAL) 5 MG tablet, Take 1 tablet (5 mg total) by mouth every evening., Disp: 90 tablet, Rfl: 1   Microlet Lancets MISC, Use as instructed to check blood sugars daily dx: e11.9, Disp: 100 each, Rfl: 11   montelukast (SINGULAIR) 10 MG tablet, TAKE 1 TABLET BY MOUTH DAILY, Disp: 90 tablet, Rfl:  3   OXYGEN, at bedtime. 2lpm 24/7 AHC, Disp: , Rfl:    Pitavastatin Calcium 4 MG TABS, TAKE 1 TABLET BY MOUTH DAILY, Disp: 90 tablet, Rfl: 3   Semaglutide-Weight Management (WEGOVY) 1 MG/0.5ML SOAJ, Inject 1 mg into the skin once a week., Disp: 2 mL, Rfl: 0   spironolactone (ALDACTONE) 25 MG tablet, TAKE 1 TABLET BY MOUTH DAILY, Disp: 90 tablet, Rfl: 1   UNABLE TO FIND, Med Name: CPAP with o2 bled in 2lpm, Disp: , Rfl:    dapagliflozin propanediol  (FARXIGA) 10 MG TABS tablet, TAKE 1 TABLET(10 MG) BY MOUTH DAILY BEFORE BREAKFAST, Disp: 30 tablet, Rfl: 5   ELIQUIS 5 MG TABS tablet, TAKE 1 TABLET BY MOUTH TWICE  DAILY (Patient not taking: Reported on 03/09/2023), Disp: 180 tablet, Rfl: 3   Allergies  Allergen Reactions   Lisinopril Cough   Statins Other (See Comments)    Cause muscle weakness     Review of Systems  Constitutional: Negative.   Eyes:  Negative for blurred vision.  Respiratory: Negative.  Negative for shortness of breath.   Cardiovascular: Negative.  Negative for chest pain and palpitations.  Gastrointestinal: Negative.   Endocrine: Negative for polydipsia, polyphagia and polyuria.  Musculoskeletal: Negative.   Neurological: Negative.  Negative for dizziness and headaches.  Psychiatric/Behavioral: Negative.       Today's Vitals   03/09/23 1005  BP: 134/76  Pulse: 81  Temp: 98.1 F (36.7 C)  SpO2: 98%  Weight: 286 lb 12.8 oz (130.1 kg)  Height: 5\' 2"  (1.575 m)   Body mass index is 52.46 kg/m.  Wt Readings from Last 3 Encounters:  03/09/23 286 lb 12.8 oz (130.1 kg)  10/26/22 281 lb 12.8 oz (127.8 kg)  07/13/22 292 lb 4.8 oz (132.6 kg)    The 10-year ASCVD risk score (Arnett DK, et al., 2019) is: 12.6%   Values used to calculate the score:     Age: 2 years     Sex: Female     Is Non-Hispanic African American: Yes     Diabetic: Yes     Tobacco smoker: No     Systolic Blood Pressure: 134 mmHg     Is BP treated: Yes     HDL Cholesterol: 47 mg/dL     Total Cholesterol: 152 mg/dL  Objective:  Physical Exam Vitals and nursing note reviewed.  Constitutional:      Appearance: Normal appearance. She is obese.  HENT:     Head: Normocephalic and atraumatic.  Eyes:     Extraocular Movements: Extraocular movements intact.  Cardiovascular:     Rate and Rhythm: Normal rate and regular rhythm.     Heart sounds: Normal heart sounds.  Pulmonary:     Effort: Pulmonary effort is normal.     Breath sounds:  Normal breath sounds.  Musculoskeletal:     Cervical back: Normal range of motion.  Skin:    General: Skin is warm.  Neurological:     General: No focal deficit present.     Mental Status: She is alert.  Psychiatric:        Mood and Affect: Mood normal.        Behavior: Behavior normal.         Assessment And Plan:  1. Hyperlipidemia associated with type 2 diabetes mellitus (HCC) Comments: Chronic, LDL goal < 70. I will increase weekly semaglutide to 1mg  weekly, c/w Farxiga 10mg  daily. She will f/u in 3 months for CPE. - CMP14+EGFR -  Lipid panel - Hemoglobin A1c  2. Hypertensive heart disease with chronic diastolic congestive heart failure (HCC) Comments: Chronic, fair control. She will c/w Azor 10/40 and spironolactone 25mg  daily. She is encouraged to follow a low sodium diet. - CMP14+EGFR  3. Class 3 severe obesity due to excess calories with serious comorbidity and body mass index (BMI) of 50.0 to 59.9 in adult [facility]) Comments: BMI 52. She is encouraged to initially strive for BMI less than 45 to decrease cardiac risk. Advised to aim for at least 150 minutes of exercise per week.  4. Need for shingles vaccine - Zoster Recombinant (Shingrix )   Return in 3 months (on 06/09/2023), or PHYS.  Patient was given opportunity to ask questions. Patient verbalized understanding of the plan and was able to repeat key elements of the plan. All questions were answered to their satisfaction.   I, [name], MD, have reviewed all documentation for this visit. The documentation on 03/09/23 for the exam, diagnosis, procedures, and orders are all accurate and complete.   IF YOU HAVE BEEN REFERRED TO A SPECIALIST, IT MAY TAKE 1-2 WEEKS TO SCHEDULE/PROCESS THE REFERRAL. IF YOU HAVE NOT HEARD FROM US/SPECIALIST IN TWO WEEKS, PLEASE GIVE [place] A CALL AT [phone].

## 2023-03-09 NOTE — Patient Instructions (Signed)

## 2023-03-10 ENCOUNTER — Encounter: Payer: Self-pay | Admitting: Internal Medicine

## 2023-03-10 LAB — CMP14+EGFR
ALT: 29 IU/L (ref 0–32)
AST: 20 IU/L (ref 0–40)
Albumin/Globulin Ratio: 2
Albumin: 4.8 g/dL (ref 3.8–4.9)
Alkaline Phosphatase: 89 IU/L (ref 44–121)
BUN/Creatinine Ratio: 18 (ref 9–23)
BUN: 15 mg/dL (ref 6–24)
Bilirubin Total: 0.4 mg/dL (ref 0.0–1.2)
CO2: 22 mmol/L (ref 20–29)
Calcium: 9.7 mg/dL (ref 8.7–10.2)
Chloride: 101 mmol/L (ref 96–106)
Creatinine, Ser: 0.83 mg/dL (ref 0.57–1.00)
Globulin, Total: 2.4 g/dL (ref 1.5–4.5)
Glucose: 179 mg/dL — ABNORMAL HIGH (ref 70–99)
Potassium: 4.3 mmol/L (ref 3.5–5.2)
Sodium: 140 mmol/L (ref 134–144)
Total Protein: 7.2 g/dL (ref 6.0–8.5)
eGFR: 83 mL/min/{1.73_m2} (ref >59)

## 2023-03-10 LAB — LIPID PANEL
Chol/HDL Ratio: 5 ratio — ABNORMAL HIGH (ref 0.0–4.4)
Cholesterol, Total: 211 mg/dL — ABNORMAL HIGH (ref 100–199)
HDL: 42 mg/dL (ref >39)
LDL Chol Calc (NIH): 137 mg/dL — ABNORMAL HIGH (ref 0–99)
Triglycerides: 178 mg/dL — ABNORMAL HIGH (ref 0–149)
VLDL Cholesterol Cal: 32 mg/dL (ref 5–40)

## 2023-03-10 LAB — HEMOGLOBIN A1C
Est. average glucose Bld gHb Est-mCnc: 240 mg/dL
Hgb A1c MFr Bld: 10 % — ABNORMAL HIGH (ref 4.8–5.6)

## 2023-03-11 ENCOUNTER — Encounter: Payer: Self-pay | Admitting: Internal Medicine

## 2023-04-05 ENCOUNTER — Other Ambulatory Visit: Payer: Self-pay | Admitting: Internal Medicine

## 2023-05-20 ENCOUNTER — Other Ambulatory Visit: Payer: Self-pay | Admitting: Internal Medicine

## 2023-06-10 ENCOUNTER — Ambulatory Visit (INDEPENDENT_AMBULATORY_CARE_PROVIDER_SITE_OTHER): Payer: Commercial Managed Care - HMO | Admitting: Internal Medicine

## 2023-06-10 ENCOUNTER — Encounter: Payer: Self-pay | Admitting: Internal Medicine

## 2023-06-10 VITALS — BP 130/82 | HR 98 | Temp 98.7°F | Ht 62.0 in | Wt 287.0 lb

## 2023-06-10 DIAGNOSIS — I110 Hypertensive heart disease with heart failure: Secondary | ICD-10-CM

## 2023-06-10 DIAGNOSIS — Z23 Encounter for immunization: Secondary | ICD-10-CM | POA: Diagnosis not present

## 2023-06-10 DIAGNOSIS — Z0000 Encounter for general adult medical examination without abnormal findings: Secondary | ICD-10-CM | POA: Diagnosis not present

## 2023-06-10 DIAGNOSIS — E785 Hyperlipidemia, unspecified: Secondary | ICD-10-CM

## 2023-06-10 DIAGNOSIS — Z6841 Body Mass Index (BMI) 40.0 and over, adult: Secondary | ICD-10-CM

## 2023-06-10 DIAGNOSIS — E1169 Type 2 diabetes mellitus with other specified complication: Secondary | ICD-10-CM

## 2023-06-10 DIAGNOSIS — I5032 Chronic diastolic (congestive) heart failure: Secondary | ICD-10-CM | POA: Diagnosis not present

## 2023-06-10 DIAGNOSIS — Z86711 Personal history of pulmonary embolism: Secondary | ICD-10-CM

## 2023-06-10 LAB — POCT URINALYSIS DIPSTICK
Bilirubin, UA: NEGATIVE
Blood, UA: NEGATIVE
Glucose, UA: POSITIVE — AB
Ketones, UA: NEGATIVE
Leukocytes, UA: NEGATIVE
Nitrite, UA: NEGATIVE
Protein, UA: NEGATIVE
Spec Grav, UA: 1.025 (ref 1.010–1.025)
Urobilinogen, UA: 0.2 U/dL
pH, UA: 5.5 (ref 5.0–8.0)

## 2023-06-10 MED ORDER — BUDESONIDE-FORMOTEROL FUMARATE 160-4.5 MCG/ACT IN AERO: 2.0000 | INHALATION_SPRAY | RESPIRATORY_TRACT | 3 refills | Status: AC | PRN

## 2023-06-10 MED ORDER — MOUNJARO 5 MG/0.5ML ~~LOC~~ SOAJ: 5.0000 mg | SUBCUTANEOUS | 0 refills | Status: DC

## 2023-06-10 MED ORDER — DAPAGLIFLOZIN PROPANEDIOL 10 MG PO TABS: ORAL_TABLET | ORAL | 5 refills | Status: DC

## 2023-06-10 MED ORDER — AMLODIPINE-OLMESARTAN 10-40 MG PO TABS: 1.0000 | ORAL_TABLET | Freq: Every day | ORAL | 2 refills | Status: DC

## 2023-06-10 NOTE — Progress Notes (Signed)
I,Victoria T Deloria Lair, CMA,acting as a Neurosurgeon for [name], MD.,have documented all relevant documentation on the behalf of [name], MD,as directed by  [name], MD while in the presence of [name], MD.  Subjective:    Patient ID: Brandi Howell , female    DOB: Jan 29, 1966 , 57 y.o.   MRN: [id number]  Chief Complaint  Patient presents with   Annual Exam   Hypertension   Diabetes    HPI  She is here today for a full physical examination. She is followed by Dr. Jaymes Graff for her pelvic exams. She reports compliance with meds. She denies headaches, chest pain and shortness of breath. She would like to follow up about Ozempic medication. Due to to insurance preferences she had to take [facility] for a short period of time.   Diabetes She presents for her follow-up diabetic visit. She has type 2 diabetes mellitus. There are no hypoglycemic associated symptoms. Pertinent negatives for diabetes include no blurred vision and no chest pain. There are no hypoglycemic complications. Risk factors for coronary artery disease include diabetes mellitus, dyslipidemia, hypertension, obesity and sedentary lifestyle. She is following a diabetic diet. She participates in exercise intermittently. Eye exam is not current.  Hypertension This is a chronic problem. The current episode started more than 1 month ago. The problem has been gradually improving since onset. The problem is controlled. Pertinent negatives include no blurred vision, chest pain or palpitations. Risk factors for coronary artery disease include sedentary lifestyle, diabetes mellitus, dyslipidemia, obesity and post-menopausal state. The current treatment provides moderate improvement. Hypertensive end-organ damage includes heart failure.     Past Medical History:  Diagnosis Date   Asthma    Diabetes mellitus without complication (HCC)    Goiter    Gout    Hypercholesteremia    Hypertension    Vitamin D  deficiency      Family History  Problem Relation Age of Onset   Hypertension Mother    Hyperlipidemia Mother    Heart disease Father      Current Outpatient Medications:    albuterol (VENTOLIN HFA) 108 (90 Base) MCG/ACT inhaler, Inhale 2 puffs into the lungs every 6 (six) hours as needed for wheezing or shortness of breath., Disp: , Rfl:    furosemide (LASIX) 40 MG tablet, Take 40 mg by mouth daily., Disp: , Rfl:    glucose blood (CONTOUR NEXT TEST) test strip, 1 each by Other route as needed for other. Use as instructed to check blood sugars daily dx: e11.9, Disp: 100 each, Rfl: 11   levocetirizine (XYZAL) 5 MG tablet, Take 1 tablet (5 mg total) by mouth every evening., Disp: 90 tablet, Rfl: 1   Microlet Lancets MISC, Use as instructed to check blood sugars daily dx: e11.9, Disp: 100 each, Rfl: 11   montelukast (SINGULAIR) 10 MG tablet, TAKE 1 TABLET BY MOUTH DAILY, Disp: 90 tablet, Rfl: 3   OXYGEN, at bedtime. 2lpm 24/7 [facility], Disp: , Rfl:    Pitavastatin Calcium 4 MG TABS, TAKE 1 TABLET BY MOUTH DAILY, Disp: 90 tablet, Rfl: 3   spironolactone (ALDACTONE) 25 MG tablet, TAKE 1 TABLET BY MOUTH DAILY, Disp: 90 tablet, Rfl: 1   UNABLE TO FIND, Med Name: CPAP with o2 bled in 2lpm, Disp: , Rfl:    amLODipine-olmesartan (AZOR) 10-40 MG tablet, Take 1 tablet by mouth daily., Disp: 90 tablet, Rfl: 2   budesonide-formoterol (SYMBICORT) 160-4.5 MCG/ACT inhaler, Inhale 2 puffs into the lungs  as needed., Disp: 1 each, Rfl: 3   cetirizine (ZYRTEC) 10 MG tablet, Take 10 mg by mouth as needed for allergies.  (Patient not taking: Reported on 06/10/2023), Disp: , Rfl:    dapagliflozin propanediol (FARXIGA) 10 MG TABS tablet, TAKE 1 TABLET(10 MG) BY MOUTH DAILY BEFORE BREAKFAST, Disp: 30 tablet, Rfl: 5   Dulaglutide (TRULICITY) 0.75 MG/0.5ML SOPN, Inject 0.75 mg into the skin once a week., Disp: 2 mL, Rfl: 0   Allergies  Allergen Reactions   Lisinopril Cough   Statins Other (See Comments)    Cause muscle  weakness      The patient states she uses post menopausal status for birth control. No LMP recorded. Patient is perimenopausal.. Negative for Dysmenorrhea. Negative for: breast discharge, breast lump(s), breast pain and breast self exam. Associated symptoms include abnormal vaginal bleeding. Pertinent negatives include abnormal bleeding (hematology), anxiety, decreased libido, depression, difficulty falling sleep, dyspareunia, history of infertility, nocturia, sexual dysfunction, sleep disturbances, urinary incontinence, urinary urgency, vaginal discharge and vaginal itching. Diet regular.The patient states her exercise level is    . The patient's tobacco use is:  Social History   Tobacco Use  Smoking Status Never  Smokeless Tobacco Never  . She has been exposed to passive smoke. The patient's alcohol use is:  Social History   Substance and Sexual Activity  Alcohol Use Yes   Comment: occ    Review of Systems  Constitutional: Negative.   HENT: Negative.    Eyes: Negative.  Negative for blurred vision.  Respiratory: Negative.    Cardiovascular: Negative.  Negative for chest pain and palpitations.  Gastrointestinal: Negative.   Endocrine: Negative.   Genitourinary: Negative.   Musculoskeletal: Negative.   Skin: Negative.   Allergic/Immunologic: Negative.   Neurological: Negative.   Hematological: Negative.   Psychiatric/Behavioral: Negative.       Today's Vitals   06/10/23 1454  BP: 130/82  Pulse: 98  Temp: 98.7 F (37.1 C)  SpO2: 98%  Weight: 287 lb (130.2 kg)  Height: 5\' 2"  (1.575 m)   Body mass index is 52.49 kg/m.  Wt Readings from Last 3 Encounters:  06/10/23 287 lb (130.2 kg)  03/09/23 286 lb 12.8 oz (130.1 kg)  10/26/22 281 lb 12.8 oz (127.8 kg)     Objective:  Physical Exam Vitals and nursing note reviewed.  Constitutional:      Appearance: Normal appearance. She is obese.  HENT:     Head: Normocephalic and atraumatic.     Right Ear: Tympanic  membrane, ear canal and external ear normal.     Left Ear: Tympanic membrane, ear canal and external ear normal.     Nose: Nose normal.     Mouth/Throat:     Mouth: Mucous membranes are moist.     Pharynx: Oropharynx is clear.  Eyes:     Extraocular Movements: Extraocular movements intact.     Conjunctiva/sclera: Conjunctivae normal.     Pupils: Pupils are equal, round, and reactive to light.  Cardiovascular:     Rate and Rhythm: Normal rate and regular rhythm.     Pulses: Normal pulses.          Dorsalis pedis pulses are 2+ on the right side and 2+ on the left side.     Heart sounds: Normal heart sounds.  Pulmonary:     Effort: Pulmonary effort is normal.     Breath sounds: Normal breath sounds.  Chest:  Breasts:    Right: Normal.     Left:  Normal.     Comments: She kept her bra on Abdominal:     General: Abdomen is flat. Bowel sounds are normal.     Palpations: Abdomen is soft.  Genitourinary:    Comments: deferred Musculoskeletal:        General: Normal range of motion.     Cervical back: Normal range of motion and neck supple.  Feet:     Right foot:     Protective Sensation: 5 sites tested.  5 sites sensed.     Skin integrity: Callus and dry skin present.     Toenail Condition: Right toenails are long.     Left foot:     Protective Sensation: 5 sites tested.  5 sites sensed.     Skin integrity: Callus and dry skin present.     Toenail Condition: Left toenails are long.  Skin:    General: Skin is warm and dry.  Neurological:     General: No focal deficit present.     Mental Status: She is alert and oriented to person, place, and time.  Psychiatric:        Mood and Affect: Mood normal.        Behavior: Behavior normal.         Assessment And Plan:     Annual physical exam Assessment & Plan: A full exam was performed.  Importance of monthly self breast exams was discussed with the patient.  She is advised to get 30-45 minutes of regular exercise, no less than  four to five days per week. Both weight-bearing and aerobic exercises are recommended.  She is advised to follow a healthy diet with at least six fruits/veggies per day, decrease intake of red meat and other saturated fats and to increase fish intake to twice weekly.  Meats/fish should not be fried -- baked, boiled or broiled is preferable. It is also important to cut back on your sugar intake.  Be sure to read labels - try to avoid anything with added sugar, high fructose corn syrup or other sweeteners.  If you must use a sweetener, you can try stevia or monkfruit.  It is also important to avoid artificially sweetened foods/beverages and diet drinks. Lastly, wear SPF 50 sunscreen on exposed skin and when in direct sunlight for an extended period of time.  Be sure to avoid fast food restaurants and aim for at least 60 ounces of water daily.      Orders: -     Lipid panel -     Hemoglobin A1c -     TSH -     BMP8+eGFR  Hypertensive heart disease with chronic diastolic congestive heart failure (HCC) Assessment & Plan: Chronic, fair control. Goal BP<120/80.  EKG performed, NSR w/o acute changes. She will continue with amlodipine/olmesartan daily and spironolactone 25mg  daily. Encouraged to follow low sodium diet. She will f/u in four months.   Orders: -     POCT urinalysis dipstick -     Microalbumin / creatinine urine ratio -     EKG 12-Lead  Hyperlipidemia associated with type 2 diabetes mellitus (HCC) Assessment & Plan: Chronic, diabetic foot exam was performed. I DISCUSSED WITH THE PATIENT AT LENGTH REGARDING THE GOALS OF GLYCEMIC CONTROL AND POSSIBLE LONG-TERM COMPLICATIONS.  I  ALSO STRESSED THE IMPORTANCE OF COMPLIANCE WITH HOME GLUCOSE MONITORING, DIETARY RESTRICTIONS INCLUDING AVOIDANCE OF SUGARY DRINKS/PROCESSED FOODS,  ALONG WITH REGULAR EXERCISE.  I  ALSO STRESSED THE IMPORTANCE OF ANNUAL EYE EXAMS, SELF FOOT CARE AND  COMPLIANCE WITH OFFICE VISITS.    Class 3 severe obesity due to  excess calories with serious comorbidity and body mass index (BMI) of 50.0 to 59.9 in adult [facility]) Assessment & Plan: Chronic, she has gained six pounds since January 2024. She is encouraged to resume regular exercise, aiming for at least 150 minutes of exercise/week.    Immunization due -     Flu vaccine trivalent PF, 6mos and older(Flulaval,Afluria,Fluarix,Fluzone)  History of pulmonary embolism Assessment & Plan: She has stopped Eliquis. She is encouraged to stay well hydrated and to incorporate more exercise into her daily routine.    Other orders -     amLODIPine-Olmesartan; Take 1 tablet by mouth daily.  Dispense: 90 tablet; Refill: 2 -     Budesonide-Formoterol Fumarate; Inhale 2 puffs into the lungs as needed.  Dispense: 1 each; Refill: 3 -     Dapagliflozin Propanediol; TAKE 1 TABLET(10 MG) BY MOUTH DAILY BEFORE BREAKFAST  Dispense: 30 tablet; Refill: 5  She is encouraged to strive for BMI less than 30 to decrease cardiac risk. Advised to aim for at least 150 minutes of exercise per week.    Return in 3 months (on 09/09/2023), or dm check, for 1 year physical. Patient was given opportunity to ask questions. Patient verbalized understanding of the plan and was able to repeat key elements of the plan. All questions were answered to their satisfaction.   I, [name], MD, have reviewed all documentation for this visit. The documentation on 06/19/23 for the exam, diagnosis, procedures, and orders are all accurate and complete.

## 2023-06-10 NOTE — Patient Instructions (Signed)

## 2023-06-11 LAB — HEMOGLOBIN A1C
Est. average glucose Bld gHb Est-mCnc: 220 mg/dL
Hgb A1c MFr Bld: 9.3 % — ABNORMAL HIGH (ref 4.8–5.6)

## 2023-06-11 LAB — MICROALBUMIN / CREATININE URINE RATIO
Creatinine, Urine: 150.3 mg/dL
Microalb/Creat Ratio: 5 mg/g{creat} (ref 0–29)
Microalbumin, Urine: 7.6 ug/mL

## 2023-06-11 LAB — BMP8+EGFR
BUN/Creatinine Ratio: 20 (ref 9–23)
BUN: 17 mg/dL (ref 6–24)
CO2: 24 mmol/L (ref 20–29)
Calcium: 10.4 mg/dL — ABNORMAL HIGH (ref 8.7–10.2)
Chloride: 102 mmol/L (ref 96–106)
Creatinine, Ser: 0.87 mg/dL (ref 0.57–1.00)
Glucose: 115 mg/dL — ABNORMAL HIGH (ref 70–99)
Potassium: 4 mmol/L (ref 3.5–5.2)
Sodium: 142 mmol/L (ref 134–144)
eGFR: 78 mL/min/{1.73_m2} (ref >59)

## 2023-06-11 LAB — LIPID PANEL
Chol/HDL Ratio: 3.8 ratio (ref 0.0–4.4)
Cholesterol, Total: 171 mg/dL (ref 100–199)
HDL: 45 mg/dL (ref >39)
LDL Chol Calc (NIH): 94 mg/dL (ref 0–99)
Triglycerides: 189 mg/dL — ABNORMAL HIGH (ref 0–149)
VLDL Cholesterol Cal: 32 mg/dL (ref 5–40)

## 2023-06-11 LAB — TSH: TSH: 1.05 u[IU]/mL (ref 0.450–4.500)

## 2023-06-14 ENCOUNTER — Encounter: Payer: Self-pay | Admitting: Internal Medicine

## 2023-06-15 ENCOUNTER — Other Ambulatory Visit: Payer: Self-pay

## 2023-06-15 MED ORDER — TRULICITY 0.75 MG/0.5ML ~~LOC~~ SOAJ: 0.7500 mg | SUBCUTANEOUS | 0 refills | Status: DC

## 2023-06-19 DIAGNOSIS — Z0000 Encounter for general adult medical examination without abnormal findings: Secondary | ICD-10-CM | POA: Insufficient documentation

## 2023-06-19 NOTE — Assessment & Plan Note (Signed)
She has stopped Eliquis. She is encouraged to stay well hydrated and to incorporate more exercise into her daily routine.

## 2023-06-19 NOTE — Assessment & Plan Note (Addendum)
Chronic, fair control. Goal BP<120/80.  EKG performed, NSR w/o acute changes. She will continue with amlodipine/olmesartan daily and spironolactone 25mg  daily. Encouraged to follow low sodium diet. She will f/u in four months.

## 2023-06-19 NOTE — Assessment & Plan Note (Addendum)

## 2023-06-19 NOTE — Assessment & Plan Note (Signed)
Chronic, she has gained six pounds since January 2024. She is encouraged to resume regular exercise, aiming for at least 150 minutes of exercise/week.

## 2023-06-19 NOTE — Assessment & Plan Note (Signed)
Chronic, diabetic foot exam was performed. I DISCUSSED WITH THE PATIENT AT LENGTH REGARDING THE GOALS OF GLYCEMIC CONTROL AND POSSIBLE LONG-TERM COMPLICATIONS.  I  ALSO STRESSED THE IMPORTANCE OF COMPLIANCE WITH HOME GLUCOSE MONITORING, DIETARY RESTRICTIONS INCLUDING AVOIDANCE OF SUGARY DRINKS/PROCESSED FOODS,  ALONG WITH REGULAR EXERCISE.  I  ALSO STRESSED THE IMPORTANCE OF ANNUAL EYE EXAMS, SELF FOOT CARE AND COMPLIANCE WITH OFFICE VISITS.

## 2023-06-21 ENCOUNTER — Telehealth: Payer: Self-pay

## 2023-06-21 NOTE — Telephone Encounter (Signed)
PA for Trulicity has been submitted through cover my meds and has been approved through 05/2024. YL,RMA

## 2023-08-02 ENCOUNTER — Other Ambulatory Visit: Payer: Self-pay | Admitting: Internal Medicine

## 2023-08-30 ENCOUNTER — Other Ambulatory Visit: Payer: Self-pay | Admitting: Cardiovascular Disease

## 2023-08-31 ENCOUNTER — Other Ambulatory Visit (HOSPITAL_COMMUNITY): Payer: Self-pay

## 2023-08-31 ENCOUNTER — Other Ambulatory Visit: Payer: Self-pay | Admitting: *Deleted

## 2023-08-31 MED ORDER — SPIRONOLACTONE 25 MG PO TABS
25.0000 mg | ORAL_TABLET | Freq: Every day | ORAL | 0 refills | Status: DC
  Filled 2023-08-31: qty 30, 30d supply, fill #0

## 2023-09-20 ENCOUNTER — Encounter: Payer: Self-pay | Admitting: Internal Medicine

## 2023-09-20 ENCOUNTER — Ambulatory Visit: Payer: Commercial Managed Care - HMO | Admitting: Internal Medicine

## 2023-09-20 VITALS — BP 130/78 | HR 81 | Temp 99.4°F | Ht 62.0 in | Wt 284.8 lb

## 2023-09-20 DIAGNOSIS — E785 Hyperlipidemia, unspecified: Secondary | ICD-10-CM

## 2023-09-20 DIAGNOSIS — I110 Hypertensive heart disease with heart failure: Secondary | ICD-10-CM

## 2023-09-20 DIAGNOSIS — E1169 Type 2 diabetes mellitus with other specified complication: Secondary | ICD-10-CM | POA: Diagnosis not present

## 2023-09-20 DIAGNOSIS — I5032 Chronic diastolic (congestive) heart failure: Secondary | ICD-10-CM | POA: Diagnosis not present

## 2023-09-20 DIAGNOSIS — Z6841 Body Mass Index (BMI) 40.0 and over, adult: Secondary | ICD-10-CM

## 2023-09-20 DIAGNOSIS — E66813 Obesity, class 3: Secondary | ICD-10-CM

## 2023-09-20 DIAGNOSIS — Z2821 Immunization not carried out because of patient refusal: Secondary | ICD-10-CM

## 2023-09-20 DIAGNOSIS — R0981 Nasal congestion: Secondary | ICD-10-CM

## 2023-09-20 MED ORDER — NOREL AD 4-10-325 MG PO TABS: ORAL_TABLET | ORAL | 0 refills | Status: DC

## 2023-09-20 MED ORDER — TRULICITY 1.5 MG/0.5ML ~~LOC~~ SOAJ: 1.5000 mg | SUBCUTANEOUS | 1 refills | Status: DC

## 2023-09-20 NOTE — Patient Instructions (Signed)
Xlear nasal spray  Chlorpheniramine; Phenylephrine Tablets What is this medication? CHLORPHENIRAMINE; PHENYLEPHRINE (klor fen IR a meen; fen il EF rin) treats the symptoms of the common cold, allergies, or flu. It works by reducing red, itchy eyes and a runny or stuffy nose. It is a combination of an antihistamine and a decongestant. This medicine may be used for other purposes; ask your health care provider or pharmacist if you have questions. COMMON BRAND NAME(S): Actifed Cold and Allergy, Allerest PE, ED A-HIST, ED-A-Hist, Giltuss-D Allergy & Congestion, Maxichlor PEH, NoHist, Phenagil, Sudafed PE Sinus and Allergy What should I tell my care team before I take this medication? They need to know if you have any of these conditions: Diabetes Glaucoma Heart disease High blood pressure Lung or breathing disease, such as COPD or asthma Taken an MAOI, such as Marplan, Nardil, or Parnate in the last 14 days Thyroid disease Trouble passing urine An unusual or allergic reaction to chlorpheniramine, phenylephrine, other medications, foods, dyes, or preservatives Pregnant or trying to get pregnant Breastfeeding How should I use this medication? Take this medication by mouth with water. Take it as directed on the label. You can take it with or without food. If it upsets your stomach, take it with food. Do not take it more often than directed. Talk to your care team about the use of this medication in children. While it may be given to children as young as 41 years of age for selected conditions, precautions do apply. People 65 years and older may have a stronger reaction and need a smaller dose. Overdosage: If you think you have taken too much of this medicine contact a poison control center or emergency room at once. NOTE: This medicine is only for you. Do not share this medicine with others. What if I miss a dose? This does not apply. This medication is not for regular use. It should only be used  as needed. What may interact with this medication? Do not take this medication with any of the following: Ergot alkaloids, such as dihydroergotamine, ergotamine, methylergonovine MAOIs, such as Marplan, Nardil, and Parnate This medication may also interact with the following: Alcohol Atropine Benzodiazepines, such as alprazolam, diazepam, lorazepam Certain medications for bladder problems, such as oxybutynin or tolterodine Certain medications for depression, such as amitriptyline or trazodone Certain medications for Parkinson disease, such as benztropine or trihexyphenidyl Certain medications for seizures, such as phenobarbital or primidone Certain medications for stomach problems, such as dicyclomine or hyoscyamine Certain medications for travel sickness, such as scopolamine Ipratropium Medications that cause drowsiness before a procedure, such as propofol Medications that help you fall asleep Medications that relax muscles Opioids for pain or cough Other antihistamines for allergy, cough, and cold Phenothiazines, such as chlorpromazine, prochlorperazine, thioridazine St. John's wort Stimulant medications for ADHD, weight loss, or staying awake This list may not describe all possible interactions. Give your health care provider a list of all the medicines, herbs, non-prescription drugs, or dietary supplements you use. Also tell them if you smoke, drink alcohol, or use illegal drugs. Some items may interact with your medicine. What should I watch for while using this medication? Visit your care team for regular checks on your progress. Tell your care team if your symptoms do not start to get better or if they get worse. If you need to use this medication for more than 7 days, talk to your care team. This medication may affect your coordination, reaction time, or judgment. Do not drive or operate  machinery until you know how this medication affects you. Sit up or stand slowly to reduce the  risk of dizzy or fainting spells. Drinking alcohol with this medication can increase the risk of these side effects. Your mouth may get dry. Chewing sugarless gum or sucking hard candy and drinking plenty of water may help. Contact your care team if the problem does not go away or is severe. What side effects may I notice from receiving this medication? Side effects that you should report to your care team as soon as possible: Allergic reactions--skin rash, itching, hives, swelling of the face, lips, tongue, or throat Heart palpitations--rapid, pounding, or irregular heartbeat Increase in blood pressure Sudden eye pain or change in vision such as blurry vision, seeing halos around lights, vision loss Trouble passing urine Side effects that usually do not require medical attention (report these to your care team if they continue or are bothersome): Anxiety, nervousness Confusion Constipation Dizziness Drowsiness Dry mouth Headache This list may not describe all possible side effects. Call your doctor for medical advice about side effects. You may report side effects to FDA at 1-800-FDA-1088. Where should I keep my medication? Keep out of the reach of children and pets. Store at room temperature between 20 and 25 degrees C (68 and 77 degrees F). Get rid of any unused medication after the expiration date. To get rid of medications that are no longer needed or have expired: Take the medication to a medication take-back program. Check with your pharmacy or law enforcement to find a location. If you cannot return the medication, check the label or package insert to see if the medication should be thrown out in the garbage or flushed down the toilet. If you are not sure, ask your care team. If it is safe to put it in the trash, empty the medication out of the container. Mix the medication with cat litter, dirt, coffee grounds, or other unwanted substance. Seal the mixture in a bag or container. Put  it in the trash. NOTE: This sheet is a summary. It may not cover all possible information. If you have questions about this medicine, talk to your doctor, pharmacist, or health care provider.  2024 Elsevier/Gold Standard (2023-02-03 00:00:00)

## 2023-09-20 NOTE — Progress Notes (Unsigned)
Madelaine Bhat, CMA,acting as a scribe for [name], MD.,have documented all relevant documentation on the behalf of [name], MD,as directed by  [name], MD while in the presence of [name], MD.  Subjective:  Patient ID: Brandi Howell , female    DOB: [birth year] , 57 y.o.   MRN: [id number]  Chief Complaint  Patient presents with   Hypertension    HPI  Patient presents today for a bp and dm follow up, Patient reports compliance with medication. Patient denies any chest pain, SOB, or headaches. Patient has no concerns today. Patient reports she has not been to the eye doctor this year or had her mammogram.   She states her sugars have improved, down to 140s-150s in the mornings.   Diabetes She presents for her follow-up diabetic visit. She has type 2 diabetes mellitus. There are no hypoglycemic associated symptoms. Pertinent negatives for hypoglycemia include no dizziness or headaches. Pertinent negatives for diabetes include no blurred vision, no chest pain, no polydipsia, no polyphagia and no polyuria. There are no hypoglycemic complications. Risk factors for coronary artery disease include sedentary lifestyle, obesity, diabetes mellitus and hypertension. Current diabetic treatment includes oral agent (dual therapy) and oral agent (monotherapy). She is compliant with treatment most of the time. She is following a diabetic diet. She has not had a previous visit with a dietitian. She rarely participates in exercise. (122-190 (3 hours after eating she also had banana pudding after that time frame) blood sugar range) An ACE inhibitor/angiotensin II receptor blocker is being taken. She does not see a podiatrist.Eye exam is not current (she has an appt next month).  Hypertension This is a chronic problem. The current episode started more than 1 year ago. The problem has been gradually improving since onset. The problem is controlled. Pertinent negatives include no  blurred vision, chest pain, headaches, palpitations or shortness of breath. Hypertensive end-organ damage includes heart failure.     Past Medical History:  Diagnosis Date   Asthma    Diabetes mellitus without complication (HCC)    Goiter    Gout    Hypercholesteremia    Hypertension    Vitamin D deficiency      Family History  Problem Relation Age of Onset   Hypertension Mother    Hyperlipidemia Mother    Heart disease Father      Current Outpatient Medications:    albuterol (VENTOLIN HFA) 108 (90 Base) MCG/ACT inhaler, Inhale 2 puffs into the lungs every 6 (six) hours as needed for wheezing or shortness of breath., Disp: , Rfl:    amLODipine-olmesartan (AZOR) 10-40 MG tablet, Take 1 tablet by mouth daily., Disp: 90 tablet, Rfl: 2   budesonide-formoterol (SYMBICORT) 160-4.5 MCG/ACT inhaler, Inhale 2 puffs into the lungs as needed., Disp: 1 each, Rfl: 3   dapagliflozin propanediol (FARXIGA) 10 MG TABS tablet, TAKE 1 TABLET(10 MG) BY MOUTH DAILY BEFORE BREAKFAST, Disp: 30 tablet, Rfl: 5   Dulaglutide (TRULICITY) 1.5 MG/0.5ML SOAJ, Inject 1.5 mg into the skin once a week., Disp: 2 mL, Rfl: 1   furosemide (LASIX) 40 MG tablet, Take 40 mg by mouth daily., Disp: , Rfl:    glucose blood (CONTOUR NEXT TEST) test strip, 1 each by Other route as needed for other. Use as instructed to check blood sugars daily dx: e11.9, Disp: 100 each, Rfl: 11   levocetirizine (XYZAL) 5 MG tablet, Take 1 tablet (5 mg total) by mouth every evening., Disp: 90 tablet,  Rfl: 1   Microlet Lancets MISC, Use as instructed to check blood sugars daily dx: e11.9, Disp: 100 each, Rfl: 11   montelukast (SINGULAIR) 10 MG tablet, TAKE 1 TABLET BY MOUTH DAILY, Disp: 90 tablet, Rfl: 3   OXYGEN, at bedtime. 2lpm 24/7 AHC, Disp: , Rfl:    Pitavastatin Calcium 4 MG TABS, TAKE 1 TABLET BY MOUTH DAILY, Disp: 90 tablet, Rfl: 3   spironolactone (ALDACTONE) 25 MG tablet, Take 1 tablet (25 mg total) by mouth daily. 1st  attempt. Pt  needs yearly appt for any future refills.  Please call to schedule appt., Disp: 30 tablet, Rfl: 0   UNABLE TO FIND, Med Name: CPAP with o2 bled in 2lpm, Disp: , Rfl:    cetirizine (ZYRTEC) 10 MG tablet, Take 10 mg by mouth as needed for allergies.  (Patient not taking: Reported on 06/10/2023), Disp: , Rfl:    Allergies  Allergen Reactions   Lisinopril Cough   Statins Other (See Comments)    Cause muscle weakness     Review of Systems  Constitutional: Negative.   HENT:  Positive for congestion.   Eyes:  Negative for blurred vision.  Respiratory: Negative.  Negative for shortness of breath.   Cardiovascular: Negative.  Negative for chest pain and palpitations.  Gastrointestinal: Negative.   Endocrine: Negative for polydipsia, polyphagia and polyuria.  Neurological: Negative.  Negative for dizziness and headaches.  Psychiatric/Behavioral: Negative.       Today's Vitals   09/20/23 0959  BP: 130/78  Pulse: 81  Temp: 99.4 F (37.4 C)  TempSrc: Oral  Weight: 284 lb 12.8 oz (129.2 kg)  Height: 5\' 2"  (1.575 m)  PainSc: 0-No pain   Body mass index is 52.09 kg/m.  Wt Readings from Last 3 Encounters:  09/20/23 284 lb 12.8 oz (129.2 kg)  06/10/23 287 lb (130.2 kg)  03/09/23 286 lb 12.8 oz (130.1 kg)    The 10-year ASCVD risk score (Arnett DK, et al., 2019) is: 13.2%   Values used to calculate the score:     Age: 40 years     Sex: Female     Is Non-Hispanic African American: Yes     Diabetic: Yes     Tobacco smoker: No     Systolic Blood Pressure: 130 mmHg     Is BP treated: Yes     HDL Cholesterol: 45 mg/dL     Total Cholesterol: 171 mg/dL  Objective:  Physical Exam Vitals and nursing note reviewed.  Constitutional:      Appearance: Normal appearance.  HENT:     Head: Normocephalic and atraumatic.  Eyes:     Extraocular Movements: Extraocular movements intact.  Cardiovascular:     Rate and Rhythm: Normal rate and regular rhythm.     Heart sounds: Normal heart sounds.   Pulmonary:     Effort: Pulmonary effort is normal.     Breath sounds: Normal breath sounds.  Musculoskeletal:     Cervical back: Normal range of motion.  Skin:    General: Skin is warm.  Neurological:     General: No focal deficit present.     Mental Status: She is alert.  Psychiatric:        Mood and Affect: Mood normal.        Behavior: Behavior normal.         Assessment And Plan:  Hypertensive heart disease with chronic diastolic congestive heart failure (HCC)  Hyperlipidemia associated with type 2 diabetes mellitus (HCC) -  Hemoglobin A1c -     CMP14+EGFR  Class 3 severe obesity due to excess calories with serious comorbidity and body mass index (BMI) of 50.0 to 59.9 in adult [facility])  COVID-19 vaccination declined  Other orders -     Trulicity; Inject 1.5 mg into the skin once a week.  Dispense: 2 mL; Refill: 1    Return in about 3 months (around 12/19/2023), or dm check.  Patient was given opportunity to ask questions. Patient verbalized understanding of the plan and was able to repeat key elements of the plan. All questions were answered to their satisfaction.    I, [name], MD, have reviewed all documentation for this visit. The documentation on 09/20/23 for the exam, diagnosis, procedures, and orders are all accurate and complete.   IF YOU HAVE BEEN REFERRED TO A SPECIALIST, IT MAY TAKE 1-2 WEEKS TO SCHEDULE/PROCESS THE REFERRAL. IF YOU HAVE NOT HEARD FROM US/SPECIALIST IN TWO WEEKS, PLEASE GIVE [place] A CALL AT [phone].

## 2023-09-21 ENCOUNTER — Encounter: Payer: Self-pay | Admitting: Internal Medicine

## 2023-09-21 LAB — CMP14+EGFR
ALT: 35 [IU]/L — ABNORMAL HIGH (ref 0–32)
AST: 18 [IU]/L (ref 0–40)
Albumin: 4.6 g/dL (ref 3.8–4.9)
Alkaline Phosphatase: 90 [IU]/L (ref 44–121)
BUN/Creatinine Ratio: 14 (ref 9–23)
BUN: 11 mg/dL (ref 6–24)
Bilirubin Total: 0.3 mg/dL (ref 0.0–1.2)
CO2: 22 mmol/L (ref 20–29)
Calcium: 9.7 mg/dL (ref 8.7–10.2)
Chloride: 105 mmol/L (ref 96–106)
Creatinine, Ser: 0.76 mg/dL (ref 0.57–1.00)
Globulin, Total: 2.5 g/dL (ref 1.5–4.5)
Glucose: 154 mg/dL — ABNORMAL HIGH (ref 70–99)
Potassium: 4.1 mmol/L (ref 3.5–5.2)
Sodium: 144 mmol/L (ref 134–144)
Total Protein: 7.1 g/dL (ref 6.0–8.5)
eGFR: 92 mL/min/{1.73_m2} (ref >59)

## 2023-09-21 LAB — HEMOGLOBIN A1C
Est. average glucose Bld gHb Est-mCnc: 209 mg/dL
Hgb A1c MFr Bld: 8.9 % — ABNORMAL HIGH (ref 4.8–5.6)

## 2023-10-01 ENCOUNTER — Other Ambulatory Visit: Payer: Self-pay | Admitting: Cardiovascular Disease

## 2023-10-01 ENCOUNTER — Other Ambulatory Visit (HOSPITAL_COMMUNITY): Payer: Self-pay

## 2023-10-01 MED ORDER — SPIRONOLACTONE 25 MG PO TABS
25.0000 mg | ORAL_TABLET | Freq: Every day | ORAL | 0 refills | Status: DC
  Filled 2023-10-01: qty 15, 15d supply, fill #0

## 2023-10-02 DIAGNOSIS — R0981 Nasal congestion: Secondary | ICD-10-CM | POA: Insufficient documentation

## 2023-10-02 NOTE — Assessment & Plan Note (Signed)
 BMI 52.  Chronic, she has lost 3 lbs since Sept 2024  She is encouraged to resume regular exercise, aiming for at least 150 minutes of exercise/week.

## 2023-10-02 NOTE — Assessment & Plan Note (Signed)
 Chronic, LDL goal is less than 70.   She will continue with Trulicity, I will increase to 1.5mg  weekly. She will f/u in 3 months for re-evaluation.

## 2023-10-02 NOTE — Assessment & Plan Note (Signed)
 Chronic, fair control. Goal BP<120/80.  She will continue with amlodipine/olmesartan 10/40mg  daily and spironolactone 25mg  daily. Encouraged to follow low sodium diet. She will f/u in three to four months.

## 2023-10-02 NOTE — Assessment & Plan Note (Signed)
 She was given samples and rx Norel Ad one tab twice daily as needed. She is encouraged to avoid OTC Sudafed use.

## 2023-10-05 ENCOUNTER — Other Ambulatory Visit (HOSPITAL_COMMUNITY): Payer: Self-pay

## 2023-11-18 ENCOUNTER — Other Ambulatory Visit: Payer: Self-pay

## 2023-11-18 MED ORDER — PITAVASTATIN CALCIUM 4 MG PO TABS: 1.0000 | ORAL_TABLET | Freq: Every day | ORAL | 3 refills | Status: DC

## 2023-11-25 ENCOUNTER — Other Ambulatory Visit: Payer: Self-pay

## 2023-11-25 ENCOUNTER — Ambulatory Visit: Payer: Commercial Managed Care - HMO | Admitting: Family Medicine

## 2023-11-25 ENCOUNTER — Encounter: Payer: Self-pay | Admitting: Family Medicine

## 2023-11-25 VITALS — BP 130/70 | HR 88 | Temp 98.1°F | Ht 62.0 in | Wt 296.0 lb

## 2023-11-25 DIAGNOSIS — E66813 Obesity, class 3: Secondary | ICD-10-CM

## 2023-11-25 DIAGNOSIS — Z6841 Body Mass Index (BMI) 40.0 and over, adult: Secondary | ICD-10-CM | POA: Diagnosis not present

## 2023-11-25 DIAGNOSIS — M109 Gout, unspecified: Secondary | ICD-10-CM | POA: Diagnosis not present

## 2023-11-25 MED ORDER — SPIRONOLACTONE 25 MG PO TABS: 25.0000 mg | ORAL_TABLET | Freq: Every day | ORAL | 0 refills | Status: DC

## 2023-11-25 MED ORDER — ALLOPURINOL 100 MG PO TABS: 100.0000 mg | ORAL_TABLET | Freq: Every day | ORAL | 2 refills | Status: AC

## 2023-11-25 NOTE — Progress Notes (Signed)
 I,Jameka J Llittleton, CMA,acting as a Neurosurgeon for [employer], NP.,have documented all relevant documentation on the behalf of Ellender Hose, NP,as directed by  Ellender Hose, NP while in the presence of Ellender Hose, NP.  Subjective:  Patient ID: Brandi Howell , female    DOB: January 03, 1966 , 58 y.o.   MRN: [id number]  Chief Complaint  Patient presents with   Gout    HPI   Patient is a 58 year old female who presents today for gout prophylaxis. She reports  that she use to that Allopurinol 100 mg every day for gout prevention but she stopped for some months and a few weeks ago, she has felt soreness in the toe of her right foot of and on.  Patient admits that she has had to take some of her mother's medication and she felt better so she decided to come and get back on her allopurinol again.     Past Medical History:  Diagnosis Date   Asthma    Diabetes mellitus without complication (HCC)    Goiter    Gout    Hypercholesteremia    Hypertension    Vitamin D deficiency      Family History  Problem Relation Age of Onset   Hypertension Mother    Hyperlipidemia Mother    Heart disease Father      Current Outpatient Medications:    albuterol (VENTOLIN HFA) 108 (90 Base) MCG/ACT inhaler, Inhale 2 puffs into the lungs every 6 (six) hours as needed for wheezing or shortness of breath., Disp: , Rfl:    allopurinol (ZYLOPRIM) 100 MG tablet, Take 1 tablet (100 mg total) by mouth daily., Disp: 90 tablet, Rfl: 2   amLODipine-olmesartan (AZOR) 10-40 MG tablet, Take 1 tablet by mouth daily., Disp: 90 tablet, Rfl: 2   budesonide-formoterol (SYMBICORT) 160-4.5 MCG/ACT inhaler, Inhale 2 puffs into the lungs as needed., Disp: 1 each, Rfl: 3   cetirizine (ZYRTEC) 10 MG tablet, Take 10 mg by mouth as needed for allergies., Disp: , Rfl:    Chlorphen-PE-Acetaminophen (NOREL AD) 4-10-325 MG TABS, One tab po twice daily as needed, Disp: 20 tablet, Rfl: 0   dapagliflozin propanediol (FARXIGA) 10 MG TABS  tablet, TAKE 1 TABLET(10 MG) BY MOUTH DAILY BEFORE BREAKFAST, Disp: 30 tablet, Rfl: 5   Dulaglutide (TRULICITY) 1.5 MG/0.5ML SOAJ, Inject 1.5 mg into the skin once a week., Disp: 2 mL, Rfl: 1   furosemide (LASIX) 40 MG tablet, Take 40 mg by mouth daily., Disp: , Rfl:    glucose blood (CONTOUR NEXT TEST) test strip, 1 each by Other route as needed for other. Use as instructed to check blood sugars daily dx: e11.9, Disp: 100 each, Rfl: 11   levocetirizine (XYZAL) 5 MG tablet, Take 1 tablet (5 mg total) by mouth every evening., Disp: 90 tablet, Rfl: 1   Microlet Lancets MISC, Use as instructed to check blood sugars daily dx: e11.9, Disp: 100 each, Rfl: 11   montelukast (SINGULAIR) 10 MG tablet, TAKE 1 TABLET BY MOUTH DAILY, Disp: 90 tablet, Rfl: 3   OXYGEN, at bedtime. 2lpm 24/7 AHC, Disp: , Rfl:    UNABLE TO FIND, Med Name: CPAP with o2 bled in 2lpm, Disp: , Rfl:    Pitavastatin Calcium 4 MG TABS, Take 1 tablet (4 mg total) by mouth daily., Disp: 90 tablet, Rfl: 3   spironolactone (ALDACTONE) 25 MG tablet, Take 1 tablet (25 mg total) by mouth daily. Please call office to schedule an appt for further refills.  Thank you, Disp: 15 tablet, Rfl: 0   Allergies  Allergen Reactions   Lisinopril Cough   Statins Other (See Comments)    Cause muscle weakness     Review of Systems  Constitutional: Negative.   HENT: Negative.    Eyes: Negative.   Respiratory: Negative.    Cardiovascular: Negative.   Musculoskeletal:  Positive for arthralgias and joint swelling.  Neurological: Negative.      Today's Vitals   11/25/23 0951  BP: 130/70  Pulse: 88  Temp: 98.1 F (36.7 C)  TempSrc: Oral  Weight: 296 lb (134.3 kg)  Height: 5\' 2"  (1.575 m)  PainSc: 4   PainLoc: Toe   Body mass index is 54.14 kg/m.  Wt Readings from Last 3 Encounters:  11/25/23 296 lb (134.3 kg)  09/20/23 284 lb 12.8 oz (129.2 kg)  06/10/23 287 lb (130.2 kg)    The 10-year ASCVD risk score (Arnett DK, et al., 2019) is:  13.2%   Values used to calculate the score:     Age: 41 years     Sex: Female     Is Non-Hispanic African American: Yes     Diabetic: Yes     Tobacco smoker: No     Systolic Blood Pressure: 130 mmHg     Is BP treated: Yes     HDL Cholesterol: 45 mg/dL     Total Cholesterol: 171 mg/dL  Objective:  Physical Exam HENT:     Head: Normocephalic.  Cardiovascular:     Rate and Rhythm: Normal rate.  Pulmonary:     Effort: Pulmonary effort is normal.  Neurological:     Mental Status: She is alert and oriented to person, place, and time.  Psychiatric:        Mood and Affect: Mood normal.         Assessment And Plan:  Gout of right foot, unspecified cause, unspecified chronicity -     Uric acid -     Allopurinol; Take 1 tablet (100 mg total) by mouth daily.  Dispense: 90 tablet; Refill: 2  Class 3 severe obesity due to excess calories with serious comorbidity and body mass index (BMI) of 50.0 to 59.9 in adult [facility]) Assessment & Plan: She is encouraged to strive for BMI less than 30 to decrease cardiac risk. Advised to aim for at least 150 minutes of exercise per week.      Return if symptoms worsen or fail to improve, for keep next appt with PCP.  Patient was given opportunity to ask questions. Patient verbalized understanding of the plan and was able to repeat key elements of the plan. All questions were answered to their satisfaction.    I, Ellender Hose, NP, have reviewed all documentation for this visit. The documentation on 12/01/2023 for the exam, diagnosis, procedures, and orders are all accurate and complete.   IF YOU HAVE BEEN REFERRED TO A SPECIALIST, IT MAY TAKE 1-2 WEEKS TO SCHEDULE/PROCESS THE REFERRAL. IF YOU HAVE NOT HEARD FROM US/SPECIALIST IN TWO WEEKS, PLEASE GIVE [place] A CALL AT [phone].

## 2023-11-26 ENCOUNTER — Encounter: Payer: Self-pay | Admitting: Family Medicine

## 2023-11-26 ENCOUNTER — Telehealth: Payer: Self-pay

## 2023-11-26 LAB — URIC ACID: Uric Acid: 5.5 mg/dL (ref 3.0–7.2)

## 2023-11-26 NOTE — Telephone Encounter (Signed)
 I attempted to call pt in regards to her labs. I was unable to leave vm. YL,RMA   Uric acid level is normal.

## 2023-11-29 ENCOUNTER — Other Ambulatory Visit: Payer: Self-pay

## 2023-11-29 MED ORDER — PITAVASTATIN CALCIUM 4 MG PO TABS: 1.0000 | ORAL_TABLET | Freq: Every day | ORAL | 3 refills | Status: AC

## 2023-12-01 NOTE — Assessment & Plan Note (Signed)
 She is encouraged to strive for BMI less than 30 to decrease cardiac risk. Advised to aim for at least 150 minutes of exercise per week.

## 2023-12-21 ENCOUNTER — Other Ambulatory Visit: Payer: Self-pay | Admitting: Cardiovascular Disease

## 2023-12-30 LAB — HM MAMMOGRAPHY

## 2023-12-31 ENCOUNTER — Encounter: Payer: Self-pay | Admitting: Internal Medicine

## 2024-01-05 ENCOUNTER — Ambulatory Visit: Payer: Commercial Managed Care - HMO | Admitting: Internal Medicine

## 2024-01-05 NOTE — Progress Notes (Deleted)
 I,Damaree Sargent T Deloria Lair, CMA,acting as a Neurosurgeon for [name], MD.,have documented all relevant documentation on the behalf of [name], MD,as directed by  [name], MD while in the presence of [name], MD.  Subjective:  Patient ID: Brandi Howell , female    DOB: [birth year] , 58 y.o.   MRN: [id number]  No chief complaint on file.   HPI  HPI   Past Medical History:  Diagnosis Date  . Asthma   . Diabetes mellitus without complication (HCC)   . Goiter   . Gout   . Hypercholesteremia   . Hypertension   . Vitamin D deficiency      Family History  Problem Relation Age of Onset  . Hypertension Mother   . Hyperlipidemia Mother   . Heart disease Father      Current Outpatient Medications:  .  albuterol (VENTOLIN HFA) 108 (90 Base) MCG/ACT inhaler, Inhale 2 puffs into the lungs every 6 (six) hours as needed for wheezing or shortness of breath., Disp: , Rfl:  .  allopurinol (ZYLOPRIM) 100 MG tablet, Take 1 tablet (100 mg total) by mouth daily., Disp: 90 tablet, Rfl: 2 .  amLODipine-olmesartan (AZOR) 10-40 MG tablet, Take 1 tablet by mouth daily., Disp: 90 tablet, Rfl: 2 .  budesonide-formoterol (SYMBICORT) 160-4.5 MCG/ACT inhaler, Inhale 2 puffs into the lungs as needed., Disp: 1 each, Rfl: 3 .  cetirizine (ZYRTEC) 10 MG tablet, Take 10 mg by mouth as needed for allergies., Disp: , Rfl:  .  Chlorphen-PE-Acetaminophen (NOREL AD) 4-10-325 MG TABS, One tab po twice daily as needed, Disp: 20 tablet, Rfl: 0 .  dapagliflozin propanediol (FARXIGA) 10 MG TABS tablet, TAKE 1 TABLET(10 MG) BY MOUTH DAILY BEFORE BREAKFAST, Disp: 30 tablet, Rfl: 5 .  Dulaglutide (TRULICITY) 1.5 MG/0.5ML SOAJ, Inject 1.5 mg into the skin once a week., Disp: 2 mL, Rfl: 1 .  furosemide (LASIX) 40 MG tablet, Take 40 mg by mouth daily., Disp: , Rfl:  .  glucose blood (CONTOUR NEXT TEST) test strip, 1 each by Other route as needed for other. Use as instructed to check blood sugars daily dx:  e11.9, Disp: 100 each, Rfl: 11 .  levocetirizine (XYZAL) 5 MG tablet, Take 1 tablet (5 mg total) by mouth every evening., Disp: 90 tablet, Rfl: 1 .  Microlet Lancets MISC, Use as instructed to check blood sugars daily dx: e11.9, Disp: 100 each, Rfl: 11 .  montelukast (SINGULAIR) 10 MG tablet, TAKE 1 TABLET BY MOUTH DAILY, Disp: 90 tablet, Rfl: 3 .  OXYGEN, at bedtime. 2lpm 24/7 AHC, Disp: , Rfl:  .  Pitavastatin Calcium 4 MG TABS, Take 1 tablet (4 mg total) by mouth daily., Disp: 90 tablet, Rfl: 3 .  spironolactone (ALDACTONE) 25 MG tablet, Take 1 tablet (25 mg total) by mouth daily. Please call office to schedule an appt for further refills. Thank you, Disp: 15 tablet, Rfl: 0 .  UNABLE TO FIND, Med Name: CPAP with o2 bled in 2lpm, Disp: , Rfl:    Allergies  Allergen Reactions  . Lisinopril Cough  . Statins Other (See Comments)    Cause muscle weakness     Review of Systems  Constitutional: Negative.   Respiratory: Negative.    Cardiovascular: Negative.   Neurological: Negative.   Psychiatric/Behavioral: Negative.      There were no vitals filed for this visit. There is no height or weight on file to calculate BMI.  Wt Readings from Last 3 Encounters:  11/25/23 296 lb (134.3 kg)  09/20/23 284 lb 12.8 oz (129.2 kg)  06/10/23 287 lb (130.2 kg)     Objective:  Physical Exam      Assessment And Plan:  Hypertensive heart disease with chronic diastolic congestive heart failure (HCC)  Uncontrolled type 2 diabetes mellitus with hyperglycemia (HCC)  Hyperlipidemia associated with type 2 diabetes mellitus (HCC)     No follow-ups on file.  Patient was given opportunity to ask questions. Patient verbalized understanding of the plan and was able to repeat key elements of the plan. All questions were answered to their satisfaction.  [name], MD  I, [name], MD, have reviewed all documentation for this visit. The documentation on 01/05/24 for the exam, diagnosis,  procedures, and orders are all accurate and complete.   IF YOU HAVE BEEN REFERRED TO A SPECIALIST, IT MAY TAKE 1-2 WEEKS TO SCHEDULE/PROCESS THE REFERRAL. IF YOU HAVE NOT HEARD FROM US/SPECIALIST IN TWO WEEKS, PLEASE GIVE [place] A CALL AT [phone].   THE PATIENT IS ENCOURAGED TO PRACTICE SOCIAL DISTANCING DUE TO THE COVID-19 PANDEMIC.

## 2024-01-10 ENCOUNTER — Encounter: Payer: Self-pay | Admitting: Internal Medicine

## 2024-01-10 ENCOUNTER — Ambulatory Visit: Payer: Self-pay | Admitting: Internal Medicine

## 2024-01-10 VITALS — BP 134/80 | HR 78 | Temp 98.1°F | Ht 62.0 in | Wt 295.0 lb

## 2024-01-10 DIAGNOSIS — J302 Other seasonal allergic rhinitis: Secondary | ICD-10-CM | POA: Insufficient documentation

## 2024-01-10 DIAGNOSIS — I5032 Chronic diastolic (congestive) heart failure: Secondary | ICD-10-CM | POA: Diagnosis not present

## 2024-01-10 DIAGNOSIS — E1169 Type 2 diabetes mellitus with other specified complication: Secondary | ICD-10-CM | POA: Diagnosis not present

## 2024-01-10 DIAGNOSIS — I110 Hypertensive heart disease with heart failure: Secondary | ICD-10-CM | POA: Diagnosis not present

## 2024-01-10 DIAGNOSIS — E049 Nontoxic goiter, unspecified: Secondary | ICD-10-CM

## 2024-01-10 DIAGNOSIS — Z8739 Personal history of other diseases of the musculoskeletal system and connective tissue: Secondary | ICD-10-CM

## 2024-01-10 DIAGNOSIS — E66813 Obesity, class 3: Secondary | ICD-10-CM

## 2024-01-10 DIAGNOSIS — Z6841 Body Mass Index (BMI) 40.0 and over, adult: Secondary | ICD-10-CM

## 2024-01-10 DIAGNOSIS — E785 Hyperlipidemia, unspecified: Secondary | ICD-10-CM

## 2024-01-10 MED ORDER — SPIRONOLACTONE 25 MG PO TABS: 25.0000 mg | ORAL_TABLET | Freq: Every day | ORAL | 0 refills | Status: DC

## 2024-01-10 MED ORDER — TRULICITY 3 MG/0.5ML ~~LOC~~ SOAJ: 3.0000 mg | SUBCUTANEOUS | 2 refills | Status: DC

## 2024-01-10 NOTE — Patient Instructions (Signed)

## 2024-01-10 NOTE — Assessment & Plan Note (Signed)
 Increased protrusion and discomfort. Previous scans showed nodules but were otherwise normal. - Order thyroid ultrasound. - Follow up on imaging order status if not contacted within a week.

## 2024-01-10 NOTE — Assessment & Plan Note (Signed)
 Chronic, suboptimal glycemic control with Trulicity and Farxiga. Blood glucose levels range from 140 to over 200 mg/dL. Insurance limitations prevent switching to Mounjaro or Ozempic. - Increase Trulicity dose to 3 mg. - Check A1c level today. - Discuss potential insurance changes for medication access. -May need to consider metformin if sugars remain elevated.  - Follow up in 3 months

## 2024-01-10 NOTE — Progress Notes (Signed)
 I,Brandi Howell, CMA,acting as a Neurosurgeon for Brandi Dung, MD.,have documented all relevant documentation on the behalf of Brandi Dung, MD,as directed by  Brandi Dung, MD while in the presence of Brandi Dung, MD.  Subjective:  Patient ID: Brandi Howell , female    DOB: Oct 12, 1965 , 58 y.o.   MRN: [id number]  Chief Complaint  Patient presents with   Hyperlipidemia    Patient presents today for dm, bp & cholesterol follow up. She reports compliance with medications. Denies headache, chest pain & sob. She has no specific questions or concerns.  Letter sent to GYN for mammogram result.    Diabetes   Hypertension    HPI  Discussed the use of AI scribe software for clinical note transcription with the patient, who gave verbal consent to proceed.  History of Present Illness Brandi Howell is a 58 year old female with diabetes and hypertension who presents for a diabetes and blood pressure check.  She has been experiencing fluctuations in her blood glucose levels, with readings ranging from 140 mg/dL to over 657 mg/dL, particularly in the mornings. She is currently on Trulicity and [place] but feels that Trulicity is not effective. She engages in physical activity by walking four times a week for the past three months and has noticed an increase in her appetite, stating she is not getting full fast enough. No symptoms related to hyperglycemia, such as increased thirst or urination, are present.  Her blood pressure is generally well-controlled with medication, typically around 120/70 mmHg. She has an upcoming cardiology appointment on January 25, 2024, after experiencing difficulty scheduling due to availability issues. She is currently taking spironolactone, with her last prescription filled in February, and has been managing her supply by using previous prescriptions.  She has noticed her thyroid protruding more and experiences some discomfort when swallowing. Previous  scans showed nodules.  She has been experiencing throat irritation at night due to pollen, which is relieved by taking Norel, a sample medication provided previously. She recalls having taken Xyzal before and believes she may have some at home.    Diabetes She presents for her follow-up diabetic visit. She has type 2 diabetes mellitus. There are no hypoglycemic associated symptoms. Pertinent negatives for hypoglycemia include no dizziness or headaches. Pertinent negatives for diabetes include no blurred vision, no chest pain, no polydipsia, no polyphagia and no polyuria. There are no hypoglycemic complications. Risk factors for coronary artery disease include sedentary lifestyle, obesity, diabetes mellitus and hypertension. Current diabetic treatment includes oral agent (dual therapy) and oral agent (monotherapy). She is compliant with treatment most of the time. She is following a diabetic diet. She has not had a previous visit with a dietitian. She rarely participates in exercise. (122-190 (3 hours after eating she also had banana pudding after that time frame) blood sugar range) An ACE inhibitor/angiotensin II receptor blocker is being taken. She does not see a podiatrist.Eye exam is not current (she has an appt next month).  Hypertension This is a chronic problem. The current episode started more than 1 year ago. The problem has been gradually improving since onset. The problem is controlled. Pertinent negatives include no blurred vision, chest pain, headaches, palpitations or shortness of breath. Hypertensive end-organ damage includes heart failure. Identifiable causes of hypertension include sleep apnea.     Past Medical History:  Diagnosis Date   Asthma    Diabetes mellitus without complication (HCC)    Goiter    Gout  Hypercholesteremia    Hypertension    Vitamin D deficiency      Family History  Problem Relation Age of Onset   Hypertension Mother    Hyperlipidemia Mother     Heart disease Father      Current Outpatient Medications:    albuterol (VENTOLIN HFA) 108 (90 Base) MCG/ACT inhaler, Inhale 2 puffs into the lungs every 6 (six) hours as needed for wheezing or shortness of breath., Disp: , Rfl:    amLODipine-olmesartan (AZOR) 10-40 MG tablet, Take 1 tablet by mouth daily., Disp: 90 tablet, Rfl: 2   budesonide-formoterol (SYMBICORT) 160-4.5 MCG/ACT inhaler, Inhale 2 puffs into the lungs as needed., Disp: 1 each, Rfl: 3   cetirizine (ZYRTEC) 10 MG tablet, Take 10 mg by mouth as needed for allergies., Disp: , Rfl:    Chlorphen-PE-Acetaminophen (NOREL AD) 4-10-325 MG TABS, One tab po twice daily as needed, Disp: 20 tablet, Rfl: 0   dapagliflozin propanediol (FARXIGA) 10 MG TABS tablet, TAKE 1 TABLET(10 MG) BY MOUTH DAILY BEFORE BREAKFAST, Disp: 30 tablet, Rfl: 5   Dulaglutide (TRULICITY) 3 MG/0.5ML SOAJ, Inject 3 mg as directed once a week., Disp: 2 mL, Rfl: 2   furosemide (LASIX) 40 MG tablet, Take 40 mg by mouth daily., Disp: , Rfl:    glucose blood (CONTOUR NEXT TEST) test strip, 1 each by Other route as needed for other. Use as instructed to check blood sugars daily dx: e11.9, Disp: 100 each, Rfl: 11   Microlet Lancets MISC, Use as instructed to check blood sugars daily dx: e11.9, Disp: 100 each, Rfl: 11   montelukast (SINGULAIR) 10 MG tablet, TAKE 1 TABLET BY MOUTH DAILY, Disp: 90 tablet, Rfl: 3   OXYGEN, at bedtime. 2lpm 24/7 AHC, Disp: , Rfl:    Pitavastatin Calcium 4 MG TABS, Take 1 tablet (4 mg total) by mouth daily., Disp: 90 tablet, Rfl: 3   UNABLE TO FIND, Med Name: CPAP with o2 bled in 2lpm, Disp: , Rfl:    allopurinol (ZYLOPRIM) 100 MG tablet, Take 1 tablet (100 mg total) by mouth daily. (Patient not taking: Reported on 01/10/2024), Disp: 90 tablet, Rfl: 2   levocetirizine (XYZAL) 5 MG tablet, Take 1 tablet (5 mg total) by mouth every evening. (Patient not taking: Reported on 01/10/2024), Disp: 90 tablet, Rfl: 1   spironolactone (ALDACTONE) 25 MG tablet,  Take 1 tablet (25 mg total) by mouth daily. Please call office to schedule an appt for further refills. Thank you, Disp: 30 tablet, Rfl: 0   Allergies  Allergen Reactions   Lisinopril Cough   Statins Other (See Comments)    Cause muscle weakness     Review of Systems  Constitutional: Negative.   Eyes:  Negative for blurred vision.  Respiratory: Negative.  Negative for shortness of breath.   Cardiovascular: Negative.  Negative for chest pain and palpitations.  Gastrointestinal: Negative.   Endocrine: Negative for polydipsia, polyphagia and polyuria.  Neurological: Negative.  Negative for dizziness and headaches.  Psychiatric/Behavioral: Negative.       Today's Vitals   01/10/24 0835  BP: 134/80  Pulse: 78  Temp: 98.1 F (36.7 C)  SpO2: 98%  Weight: 295 lb (133.8 kg)  Height: 5\' 2"  (1.575 m)   Body mass index is 53.96 kg/m.  Wt Readings from Last 3 Encounters:  01/10/24 295 lb (133.8 kg)  11/25/23 296 lb (134.3 kg)  09/20/23 284 lb 12.8 oz (129.2 kg)     Objective:  Physical Exam Vitals and nursing note reviewed.  Constitutional:      Appearance: Normal appearance. She is obese.  HENT:     Head: Normocephalic and atraumatic.  Eyes:     Extraocular Movements: Extraocular movements intact.  Neck:     Thyroid: Thyromegaly present.     Comments: Right-sided thyroid enlargement Cardiovascular:     Rate and Rhythm: Normal rate and regular rhythm.     Heart sounds: Normal heart sounds.  Pulmonary:     Effort: Pulmonary effort is normal.     Breath sounds: Normal breath sounds.  Musculoskeletal:     Cervical back: Normal range of motion.  Skin:    General: Skin is warm.  Neurological:     General: No focal deficit present.     Mental Status: She is alert.  Psychiatric:        Mood and Affect: Mood normal.        Behavior: Behavior normal.       Assessment And Plan:  Hyperlipidemia associated with type 2 diabetes mellitus (HCC) Assessment & Plan: Chronic,  suboptimal glycemic control with Trulicity and [place]. Blood glucose levels range from 140 to over 200 mg/dL. Insurance limitations prevent switching to [employer] or Ozempic. - Increase Trulicity dose to 3 mg. - Check A1c level today. - Discuss potential insurance changes for medication access. -May need to consider metformin if sugars remain elevated.  - Follow up in 3 months  Orders: -     CMP14+EGFR -     Lipid panel -     Hemoglobin A1c  Hypertensive heart disease with chronic diastolic congestive heart failure (HCC) Assessment & Plan: Chronic, fair control.  Goal BP<120/80.  She will continue with amlodipine/olmesartan 10/40mg  daily, spironolactone refilled, needs Cardiology appt before she can get this.  She is also on furosemide 40mg  daily.   She is reminded to follow low sodium diet.  - Continue current antihypertensive regimen. - Ensure follow-up with cardiology on January 25, 2024.  Orders: -     CMP14+EGFR -     Lipid panel  Goiter Assessment & Plan: Increased protrusion and discomfort. Previous scans showed nodules but were otherwise normal. - Order thyroid ultrasound. - Follow up on imaging order status if not contacted within a week.  Orders: -     US THYROID; Future  Seasonal allergies Assessment & Plan: Throat tickling at night due to pollen. Relief with Norel AD. - Prescribe Xyzal for daily use as needed.   Class 3 severe obesity due to excess calories with serious comorbidity and body mass index (BMI) of 50.0 to 59.9 in adult [facility]) Assessment & Plan: BMI 53. She has gained 11 lbs since December.  She is encouraged to incorporate strength training into her exercise routine and to strive to lose ten percent of her body weight to decrease cardiac risk.    History of gout -     Uric acid  Other orders -     Trulicity; Inject 3 mg as directed once a week.  Dispense: 2 mL; Refill: 2 -     Spironolactone; Take 1 tablet (25 mg total) by mouth daily. Please call  office to schedule an appt for further refills. Thank you  Dispense: 30 tablet; Refill: 0   Return in 3 months (on 04/10/2024), or dm check, for move physical to Oct please.  Patient was given opportunity to ask questions. Patient verbalized understanding of the plan and was able to repeat key elements of the plan. All questions were answered to their satisfaction.  I, Brandi Dung, MD, have reviewed all documentation for this visit. The documentation on 01/10/24 for the exam, diagnosis, procedures, and orders are all accurate and complete.   IF YOU HAVE BEEN REFERRED TO A SPECIALIST, IT MAY TAKE 1-2 WEEKS TO SCHEDULE/PROCESS THE REFERRAL. IF YOU HAVE NOT HEARD FROM US /SPECIALIST IN TWO WEEKS, PLEASE GIVE US  A CALL AT [phone].   THE PATIENT IS ENCOURAGED TO PRACTICE SOCIAL DISTANCING DUE TO THE COVID-19 PANDEMIC.

## 2024-01-10 NOTE — Assessment & Plan Note (Signed)
 Chronic, fair control.  Goal BP<120/80.  She will continue with amlodipine/olmesartan 10/40mg  daily, spironolactone refilled, needs Cardiology appt before she can get this.  She is also on furosemide 40mg  daily.   She is reminded to follow low sodium diet.  - Continue current antihypertensive regimen. - Ensure follow-up with cardiology on January 25, 2024.

## 2024-01-10 NOTE — Assessment & Plan Note (Addendum)
 BMI 53. She has gained 11 lbs since December.  She is encouraged to incorporate strength training into her exercise routine and to strive to lose ten percent of her body weight to decrease cardiac risk.

## 2024-01-10 NOTE — Assessment & Plan Note (Signed)
 Throat tickling at night due to pollen. Relief with Norel AD. - Prescribe Xyzal for daily use as needed.

## 2024-01-11 LAB — URIC ACID: Uric Acid: 5.4 mg/dL (ref 3.0–7.2)

## 2024-01-11 LAB — LIPID PANEL
Chol/HDL Ratio: 3.3 ratio (ref 0.0–4.4)
Cholesterol, Total: 152 mg/dL (ref 100–199)
HDL: 46 mg/dL (ref >39)
LDL Chol Calc (NIH): 85 mg/dL (ref 0–99)
Triglycerides: 116 mg/dL (ref 0–149)
VLDL Cholesterol Cal: 21 mg/dL (ref 5–40)

## 2024-01-11 LAB — CMP14+EGFR
ALT: 27 IU/L (ref 0–32)
AST: 16 IU/L (ref 0–40)
Albumin: 4.5 g/dL (ref 3.8–4.9)
Alkaline Phosphatase: 92 IU/L (ref 44–121)
BUN/Creatinine Ratio: 13 (ref 9–23)
BUN: 10 mg/dL (ref 6–24)
Bilirubin Total: 0.3 mg/dL (ref 0.0–1.2)
CO2: 22 mmol/L (ref 20–29)
Calcium: 9.3 mg/dL (ref 8.7–10.2)
Chloride: 102 mmol/L (ref 96–106)
Creatinine, Ser: 0.78 mg/dL (ref 0.57–1.00)
Globulin, Total: 2.1 g/dL (ref 1.5–4.5)
Glucose: 167 mg/dL — ABNORMAL HIGH (ref 70–99)
Potassium: 4 mmol/L (ref 3.5–5.2)
Sodium: 142 mmol/L (ref 134–144)
Total Protein: 6.6 g/dL (ref 6.0–8.5)
eGFR: 89 mL/min/{1.73_m2} (ref >59)

## 2024-01-11 LAB — HEMOGLOBIN A1C
Est. average glucose Bld gHb Est-mCnc: 243 mg/dL
Hgb A1c MFr Bld: 10.1 % — ABNORMAL HIGH (ref 4.8–5.6)

## 2024-01-17 ENCOUNTER — Encounter: Payer: Self-pay | Admitting: Internal Medicine

## 2024-01-19 ENCOUNTER — Other Ambulatory Visit: Payer: Self-pay | Admitting: Internal Medicine

## 2024-01-19 NOTE — Telephone Encounter (Unsigned)
 Copied from CRM [id number]. Topic: Clinical - Medication Refill >> Jan 19, 2024 11:29 AM Zipporah Him wrote: Most Recent Primary Care Visit:  Provider: Cleave Curling  Department: [facility]  Visit Type: OFFICE VISIT  Date: 01/10/2024  Medication: furosemide  (LASIX ) 40 MG tablet  Has the patient contacted their pharmacy? Yes (Agent: If no, request that the patient contact the pharmacy for the refill. If patient does not wish to contact the pharmacy document the reason why and proceed with request.) (Agent: If yes, when and what did the pharmacy advise?)  Is this the correct pharmacy for this prescription? Yes If no, delete pharmacy and type the correct one.  This is the patient's preferred pharmacy:  [id number] - [place], [place] - [address] AT [facility] [address] [place] [address] Phone: [phone] Fax: [phone]    Has the prescription been filled recently? Yes  Is the patient out of the medication? No, getting close  Has the patient been seen for an appointment in the last year OR does the patient have an upcoming appointment? Yes  Can we respond through MyChart? Phone  Agent: Please be advised that Rx refills may take up to 3 business days. We ask that you follow-up with your pharmacy.

## 2024-01-19 NOTE — Telephone Encounter (Signed)
 Duplicate refills request Copied from CRM [id number]. Topic: Clinical - Medication Refill >> Jan 19, 2024 11:29 AM Zipporah Him wrote: Most Recent Primary Care Visit:  Provider: Cleave Curling  Department: [facility]  Visit Type: OFFICE VISIT  Date: 01/10/2024  Medication: furosemide  (LASIX ) 40 MG tablet  Has the patient contacted their pharmacy? Yes (Agent: If no, request that the patient contact the pharmacy for the refill. If patient does not wish to contact the pharmacy document the reason why and proceed with request.) (Agent: If yes, when and what did the pharmacy advise?)  Is this the correct pharmacy for this prescription? Yes If no, delete pharmacy and type the correct one.  This is the patient's preferred pharmacy:  [id number] - [place], [place] - [address] AT [facility] OF [address] [place] [address] Phone: [phone] Fax: [phone]    Has the prescription been filled recently? Yes  Is the patient out of the medication? No, getting close  Has the patient been seen for an appointment in the last year OR does the patient have an upcoming appointment? Yes  Can we respond through MyChart? Phone  Agent: Please be advised that Rx refills may take up to 3 business days. We ask that you follow-up with your pharmacy.

## 2024-01-25 ENCOUNTER — Encounter: Payer: Self-pay | Admitting: Internal Medicine

## 2024-01-25 ENCOUNTER — Ambulatory Visit (HOSPITAL_BASED_OUTPATIENT_CLINIC_OR_DEPARTMENT_OTHER): Payer: Self-pay | Admitting: Cardiology

## 2024-01-25 ENCOUNTER — Encounter (HOSPITAL_BASED_OUTPATIENT_CLINIC_OR_DEPARTMENT_OTHER): Payer: Self-pay | Admitting: Cardiology

## 2024-01-25 VITALS — BP 132/78 | HR 103 | Ht 62.0 in | Wt 290.3 lb

## 2024-01-25 DIAGNOSIS — Z86711 Personal history of pulmonary embolism: Secondary | ICD-10-CM

## 2024-01-25 DIAGNOSIS — I10 Essential (primary) hypertension: Secondary | ICD-10-CM

## 2024-01-25 DIAGNOSIS — E66813 Obesity, class 3: Secondary | ICD-10-CM

## 2024-01-25 DIAGNOSIS — E1169 Type 2 diabetes mellitus with other specified complication: Secondary | ICD-10-CM | POA: Diagnosis not present

## 2024-01-25 DIAGNOSIS — I5032 Chronic diastolic (congestive) heart failure: Secondary | ICD-10-CM

## 2024-01-25 DIAGNOSIS — E7800 Pure hypercholesterolemia, unspecified: Secondary | ICD-10-CM

## 2024-01-25 DIAGNOSIS — E119 Type 2 diabetes mellitus without complications: Secondary | ICD-10-CM

## 2024-01-25 DIAGNOSIS — Z6841 Body Mass Index (BMI) 40.0 and over, adult: Secondary | ICD-10-CM

## 2024-01-25 MED ORDER — ASPIRIN 81 MG PO TBEC: 81.0000 mg | DELAYED_RELEASE_TABLET | Freq: Every day | ORAL | Status: AC

## 2024-01-25 MED ORDER — FUROSEMIDE 40 MG PO TABS
40.0000 mg | ORAL_TABLET | Freq: Every day | ORAL | 32 refills | Status: DC
Start: 2024-01-25 — End: 2024-01-26

## 2024-01-25 NOTE — Progress Notes (Signed)
 Cardiology Office Note:  .   Date:  01/25/2024  ID:  Brandi Howell, DOB 06-27-1966, MRN [id number] PCP: Cleave Curling, MD  [facility] Providers Cardiologist:  Sheryle Donning, MD {  History of Present Illness: Brandi Howell is a 58 y.o. female with a hx of diastolic heart failure, prior pulmonary embolism, hypertension, type II diabetes, obesity, OSA on CPAP who is seen in follow up for the evaluation and management of shortness of breath and chest pain.   Today: Has been struggling with her diabetes, A1c was 10.1. Had to change to trulicity  from ozempic  due to her coverage. Cost is an issue with her recent life changes. Blood pressure has been well controlled. Reviewed her recent diabetes and lipids, see below.  ROS: Denies chest pain, shortness of breath at rest or with normal exertion. No PND, orthopnea, LE edema or unexpected weight gain. No syncope or palpitations. ROS otherwise negative except as noted.   Studies Reviewed: Aaron Aas    EKG:       Physical Exam:   VS:  BP 132/78   Pulse (!) 103   Ht 5\' 2"  (1.575 m)   Wt 290 lb 4.8 oz (131.7 kg)   SpO2 95%   BMI 53.10 kg/m    Wt Readings from Last 3 Encounters:  01/25/24 290 lb 4.8 oz (131.7 kg)  01/10/24 295 lb (133.8 kg)  11/25/23 296 lb (134.3 kg)    GEN: Well nourished, well developed in no acute distress HEENT: Normal, moist mucous membranes NECK: No JVD CARDIAC: regular rhythm, normal S1 and S2, no rubs or gallops. No murmur. VASCULAR: Radial and DP pulses 2+ bilaterally. No carotid bruits RESPIRATORY:  Clear to auscultation without rales, wheezing or rhonchi  ABDOMEN: Soft, non-tender, non-distended MUSCULOSKELETAL:  Ambulates independently SKIN: Warm and dry, no edema NEUROLOGIC:  Alert and oriented x 3. No focal neuro deficits noted. PSYCHIATRIC:  Normal affect    ASSESSMENT AND PLAN: .    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 -tolerating trulicity  -continue pitavastatin . Reports issues on prior statins. LDL 85. Discussed ezetimibe as an add on vs. Working on Patent attorney. She does not want to add medication at this time, will work on managing her diabetes and then consider if not at goal of LDL <70 on recheck -no longer on anticoagulation. Discussed starting aspirin  today, she is amenable   Hypertension -continue amlodipine  10mg -olmesartan  40 mg combo pill.  -furosemide  and spironolactone  as above -goal <130/80, near goal today   Obesity -working on weight loss -BMI ~53 today   sleep apnea:  -on CPAP  CV risk counseling and prevention -recommend heart healthy/Mediterranean diet, with whole grains, fruits, vegetable, fish, lean meats, nuts, and olive oil. Limit salt. -recommend moderate walking, 3-5 times/week for 30-50 minutes each session. Aim for at least 150 minutes.week. Goal should be pace of 3 miles/hours, or walking 1.5 miles in 30 minutes -recommend avoidance of tobacco products. Avoid excess alcohol. -ASCVD risk score: The 10-year ASCVD risk score (Arnett DK, et al., 2019) is: 12.9%   Values used to calculate the score:     Age: 58 years     Sex: Female     Is Non-Hispanic African American: Yes     Diabetic: Yes     Tobacco smoker: No     Systolic Blood Pressure: 132 mmHg  Is BP treated: Yes     HDL Cholesterol: 46 mg/dL     Total Cholesterol: 152 mg/dL    Dispo: 1 year or sooner as needed  Signed, Sheryle Donning, MD   Sheryle Donning, MD, PhD, [facility]  [facility]  [place]  Heart & [facility]ascular at [facility] at [facility] [address] [place], [place] [address] [phone]

## 2024-01-25 NOTE — Patient Instructions (Signed)
 Medication Instructions:  Your physician recommends that you continue on your current medications as directed. Please refer to the Current Medication list given to you today.   Follow-Up: At [facility], you and your health needs are our priority.  As part of our continuing mission to provide you with exceptional heart care, our providers are all part of one team.  This team includes your primary Cardiologist (physician) and Advanced Practice Providers or APPs (Physician Assistants and Nurse Practitioners) who all work together to provide you with the care you need, when you need it.  Please follow up in 1 year  with Dr. Veryl Gottron, Slater Duncan, NP or Neomi Banks, NP

## 2024-01-26 ENCOUNTER — Other Ambulatory Visit: Payer: Self-pay

## 2024-01-26 DIAGNOSIS — I5032 Chronic diastolic (congestive) heart failure: Secondary | ICD-10-CM

## 2024-01-26 DIAGNOSIS — I10 Essential (primary) hypertension: Secondary | ICD-10-CM

## 2024-01-26 MED ORDER — FUROSEMIDE 40 MG PO TABS: 40.0000 mg | ORAL_TABLET | Freq: Every day | ORAL | 32 refills | Status: AC

## 2024-02-15 ENCOUNTER — Encounter: Payer: Self-pay | Admitting: Internal Medicine

## 2024-02-18 ENCOUNTER — Other Ambulatory Visit: Payer: Self-pay | Admitting: Internal Medicine

## 2024-02-28 ENCOUNTER — Ambulatory Visit (HOSPITAL_BASED_OUTPATIENT_CLINIC_OR_DEPARTMENT_OTHER): Payer: 59 | Admitting: Family

## 2024-03-22 ENCOUNTER — Other Ambulatory Visit: Payer: Self-pay | Admitting: Internal Medicine

## 2024-04-09 ENCOUNTER — Other Ambulatory Visit: Payer: Self-pay | Admitting: Internal Medicine

## 2024-04-16 ENCOUNTER — Other Ambulatory Visit: Payer: Self-pay | Admitting: Internal Medicine

## 2024-05-03 ENCOUNTER — Ambulatory Visit: Admitting: Internal Medicine

## 2024-05-03 ENCOUNTER — Encounter: Payer: Self-pay | Admitting: Internal Medicine

## 2024-05-03 VITALS — BP 128/82 | HR 94 | Temp 98.1°F | Ht 62.0 in | Wt 290.0 lb

## 2024-05-03 DIAGNOSIS — Z6841 Body Mass Index (BMI) 40.0 and over, adult: Secondary | ICD-10-CM

## 2024-05-03 DIAGNOSIS — I5032 Chronic diastolic (congestive) heart failure: Secondary | ICD-10-CM | POA: Diagnosis not present

## 2024-05-03 DIAGNOSIS — Z8739 Personal history of other diseases of the musculoskeletal system and connective tissue: Secondary | ICD-10-CM

## 2024-05-03 DIAGNOSIS — E785 Hyperlipidemia, unspecified: Secondary | ICD-10-CM

## 2024-05-03 DIAGNOSIS — E66813 Obesity, class 3: Secondary | ICD-10-CM

## 2024-05-03 DIAGNOSIS — E1169 Type 2 diabetes mellitus with other specified complication: Secondary | ICD-10-CM | POA: Diagnosis not present

## 2024-05-03 DIAGNOSIS — I110 Hypertensive heart disease with heart failure: Secondary | ICD-10-CM

## 2024-05-03 NOTE — Patient Instructions (Signed)
 Hypertension, Adult Hypertension is another name for high blood pressure. High blood pressure forces your heart to work harder to pump blood. This can cause problems over time. There are two numbers in a blood pressure reading. There is a top number (systolic) over a bottom number (diastolic). It is best to have a blood pressure that is below 120/80. What are the causes? The cause of this condition is not known. Some other conditions can lead to high blood pressure. What increases the risk? Some lifestyle factors can make you more likely to develop high blood pressure: Smoking. Not getting enough exercise or physical activity. Being overweight. Having too much fat, sugar, calories, or salt (sodium) in your diet. Drinking too much alcohol. Other risk factors include: Having any of these conditions: Heart disease. Diabetes. High cholesterol. Kidney disease. Obstructive sleep apnea. Having a family history of high blood pressure and high cholesterol. Age. The risk increases with age. Stress. What are the signs or symptoms? High blood pressure may not cause symptoms. Very high blood pressure (hypertensive crisis) may cause: Headache. Fast or uneven heartbeats (palpitations). Shortness of breath. Nosebleed. Vomiting or feeling like you may vomit (nauseous). Changes in how you see. Very bad chest pain. Feeling dizzy. Seizures. How is this treated? This condition is treated by making healthy lifestyle changes, such as: Eating healthy foods. Exercising more. Drinking less alcohol. Your doctor may prescribe medicine if lifestyle changes do not help enough and if: Your top number is above 130. Your bottom number is above 80. Your personal target blood pressure may vary. Follow these instructions at home: Eating and drinking  If told, follow the DASH eating plan. To follow this plan: Fill one half of your plate at each meal with fruits and vegetables. Fill one fourth of your plate  at each meal with whole grains. Whole grains include whole-wheat pasta, brown rice, and whole-grain bread. Eat or drink low-fat dairy products, such as skim milk or low-fat yogurt. Fill one fourth of your plate at each meal with low-fat (lean) proteins. Low-fat proteins include fish, chicken without skin, eggs, beans, and tofu. Avoid fatty meat, cured and processed meat, or chicken with skin. Avoid pre-made or processed food. Limit the amount of salt in your diet to less than 1,500 mg each day. Do not drink alcohol if: Your doctor tells you not to drink. You are pregnant, may be pregnant, or are planning to become pregnant. If you drink alcohol: Limit how much you have to: 0-1 drink a day for women. 0-2 drinks a day for men. Know how much alcohol is in your drink. In the [place] one drink equals one 12 oz bottle of beer (355 mL), one 5 oz glass of wine (148 mL), or one 1 oz glass of hard liquor (44 mL). Lifestyle  Work with your doctor to stay at a healthy weight or to lose weight. Ask your doctor what the best weight is for you. Get at least 30 minutes of exercise that causes your heart to beat faster (aerobic exercise) most days of the week. This may include walking, swimming, or biking. Get at least 30 minutes of exercise that strengthens your muscles (resistance exercise) at least 3 days a week. This may include lifting weights or doing Pilates. Do not smoke or use any products that contain nicotine or tobacco. If you need help quitting, ask your doctor. Check your blood pressure at home as told by your doctor. Keep all follow-up visits. Medicines Take over-the-counter and prescription medicines  only as told by your doctor. Follow directions carefully. Do not skip doses of blood pressure medicine. The medicine does not work as well if you skip doses. Skipping doses also puts you at risk for problems. Ask your doctor about side effects or reactions to medicines that you should watch  for. Contact a doctor if: You think you are having a reaction to the medicine you are taking. You have headaches that keep coming back. You feel dizzy. You have swelling in your ankles. You have trouble with your vision. Get help right away if: You get a very bad headache. You start to feel mixed up (confused). You feel weak or numb. You feel faint. You have very bad pain in your: Chest. Belly (abdomen). You vomit more than once. You have trouble breathing. These symptoms may be an emergency. Get help right away. Call 911. Do not wait to see if the symptoms will go away. Do not drive yourself to the hospital. Summary Hypertension is another name for high blood pressure. High blood pressure forces your heart to work harder to pump blood. For most people, a normal blood pressure is less than 120/80. Making healthy choices can help lower blood pressure. If your blood pressure does not get lower with healthy choices, you may need to take medicine. This information is not intended to replace advice given to you by your health care provider. Make sure you discuss any questions you have with your health care provider. Document Revised: 07/03/2021 Document Reviewed: 07/03/2021 Elsevier Patient Education  2024 [employer].

## 2024-05-03 NOTE — Progress Notes (Signed)
 I,Victoria T Emmitt, CMA,acting as a Neurosurgeon for [name], MD.,have documented all relevant documentation on the behalf of [name], MD,as directed by  [name], MD while in the presence of [name], MD.  Subjective:  Patient ID: Brandi Howell , female    DOB: 07/15/1966 , 58 y.o.   MRN: [id number]  Chief Complaint  Patient presents with   Hypertension   Hyperlipidemia   Diabetes    HPI  Diabetes She presents for her follow-up diabetic visit. She has type 2 diabetes mellitus. There are no hypoglycemic associated symptoms. Pertinent negatives for hypoglycemia include no dizziness or headaches. Pertinent negatives for diabetes include no blurred vision, no chest pain, no polydipsia, no polyphagia and no polyuria. There are no hypoglycemic complications. Risk factors for coronary artery disease include sedentary lifestyle, obesity, diabetes mellitus and hypertension. Current diabetic treatment includes oral agent (dual therapy) and oral agent (monotherapy). She is compliant with treatment most of the time. She is following a diabetic diet. She has not had a previous visit with a dietitian. She rarely participates in exercise. (122-190 (3 hours after eating she also had banana pudding after that time frame) blood sugar range) An ACE inhibitor/angiotensin II receptor blocker is being taken. She does not see a podiatrist.Eye exam is not current (she has an appt next month).  Hypertension This is a chronic problem. The current episode started more than 1 year ago. The problem has been gradually improving since onset. The problem is controlled. Pertinent negatives include no blurred vision, chest pain, headaches, palpitations or shortness of breath. Hypertensive end-organ damage includes heart failure. Identifiable causes of hypertension include sleep apnea.     Past Medical History:  Diagnosis Date   Asthma    Diabetes mellitus without complication (HCC)    Goiter     Gout    Hypercholesteremia    Hypertension    Vitamin D deficiency      Family History  Problem Relation Age of Onset   Hypertension Mother    Hyperlipidemia Mother    Heart disease Father      Current Outpatient Medications:    albuterol  (VENTOLIN  HFA) 108 (90 Base) MCG/ACT inhaler, Inhale 2 puffs into the lungs every 6 (six) hours as needed for wheezing or shortness of breath., Disp: , Rfl:    amLODipine -olmesartan  (AZOR ) 10-40 MG tablet, TAKE 1 TABLET BY MOUTH DAILY, Disp: 90 tablet, Rfl: 2   aspirin  EC 81 MG tablet, Take 1 tablet (81 mg total) by mouth daily. Swallow whole., Disp: , Rfl:    budesonide -formoterol  (SYMBICORT ) 160-4.5 MCG/ACT inhaler, Inhale 2 puffs into the lungs as needed., Disp: 1 each, Rfl: 3   cetirizine (ZYRTEC) 10 MG tablet, Take 10 mg by mouth as needed for allergies., Disp: , Rfl:    Chlorphen-PE-Acetaminophen  (NOREL AD) 4-10-325 MG TABS, One tab po twice daily as needed, Disp: 20 tablet, Rfl: 0   FARXIGA  10 MG TABS tablet, TAKE 1 TABLET(10 MG) BY MOUTH DAILY BEFORE BREAKFAST, Disp: 30 tablet, Rfl: 5   furosemide  (LASIX ) 40 MG tablet, Take 1 tablet (40 mg total) by mouth daily., Disp: 90 tablet, Rfl: 32   glucose blood (CONTOUR NEXT TEST) test strip, 1 each by Other route as needed for other. Use as instructed to check blood sugars daily dx: e11.9, Disp: 100 each, Rfl: 11   levocetirizine (XYZAL ) 5 MG tablet, Take 1 tablet (5 mg total) by mouth every evening., Disp: 90 tablet, Rfl: 1  Microlet Lancets MISC, Use as instructed to check blood sugars daily dx: e11.9, Disp: 100 each, Rfl: 11   montelukast  (SINGULAIR ) 10 MG tablet, TAKE 1 TABLET BY MOUTH DAILY, Disp: 90 tablet, Rfl: 3   OXYGEN , at bedtime. 2lpm 24/7 [facility], Disp: , Rfl:    Pitavastatin  Calcium  4 MG TABS, Take 1 tablet (4 mg total) by mouth daily., Disp: 90 tablet, Rfl: 3   spironolactone  (ALDACTONE ) 25 MG tablet, TAKE 1 TABLET(25 MG) BY MOUTH DAILY. NEED APPOINTMENT FOR REFILLS, Disp: 90 tablet, Rfl:  2   UNABLE TO FIND, Med Name: CPAP with o2 bled in 2lpm, Disp: , Rfl:    allopurinol  (ZYLOPRIM ) 100 MG tablet, Take 1 tablet (100 mg total) by mouth daily. (Patient not taking: Reported on 05/03/2024), Disp: 90 tablet, Rfl: 2   tirzepatide  (MOUNJARO ) 5 MG/0.5ML Pen, Inject 5 mg into the skin once a week., Disp: 2 mL, Rfl: 0   Allergies  Allergen Reactions   Lisinopril Cough   Statins Other (See Comments)    Cause muscle weakness     Review of Systems  Constitutional: Negative.   Eyes:  Negative for blurred vision.  Respiratory: Negative.  Negative for shortness of breath.   Cardiovascular: Negative.  Negative for chest pain and palpitations.  Endocrine: Negative for polydipsia, polyphagia and polyuria.  Neurological: Negative.  Negative for dizziness and headaches.  Psychiatric/Behavioral: Negative.       Today's Vitals   05/03/24 1017  BP: 128/82  Pulse: 94  Temp: 98.1 F (36.7 C)  SpO2: 98%  Weight: 290 lb (131.5 kg)  Height: 5' 2 (1.575 m)   Body mass index is 53.04 kg/m.  Wt Readings from Last 3 Encounters:  05/03/24 290 lb (131.5 kg)  01/25/24 290 lb 4.8 oz (131.7 kg)  01/10/24 295 lb (133.8 kg)     Objective:  Physical Exam Vitals and nursing note reviewed.  Constitutional:      Appearance: Normal appearance. She is obese.  HENT:     Head: Normocephalic and atraumatic.  Eyes:     Extraocular Movements: Extraocular movements intact.  Cardiovascular:     Rate and Rhythm: Normal rate and regular rhythm.     Heart sounds: Normal heart sounds.  Pulmonary:     Effort: Pulmonary effort is normal.     Breath sounds: Normal breath sounds.  Musculoskeletal:     Cervical back: Normal range of motion.  Skin:    General: Skin is warm.  Neurological:     General: No focal deficit present.     Mental Status: She is alert.  Psychiatric:        Mood and Affect: Mood normal.        Behavior: Behavior normal.      Assessment And Plan:  Hypertensive heart disease  with chronic diastolic congestive heart failure (HCC) Assessment & Plan: Chronic, fair control.  Goal BP<120/80.  She will continue with amlodipine /olmesartan  10/40mg  daily.  She is also on furosemide  40mg  daily.   She is reminded to follow low sodium diet.  - Continue current antihypertensive regimen. - Follow low sodium diet.   Hyperlipidemia associated with type 2 diabetes mellitus (HCC) Assessment & Plan: Chronic, will check labs as below. - continue with Trulicity  3 mg and Farxiga  - We discussed possible addition of metformin and switching to Xigduo.  - She agrees to try samples if needed, metformin has caused GI distress in the past.  - Follow up in 3 months - Will need f/u sooner  if metformin is started  Orders: -     CMP14+EGFR -     Hemoglobin A1c  Class 3 severe obesity due to excess calories with serious comorbidity and body mass index (BMI) of 50.0 to 59.9 in adult Assessment & Plan: BMI 53. She is encouraged to strive to lose ten percent of her body weight to decrease cardiac risk.  Advised to aim for at least 150 minutes of exercise per week.    History of gout -     Uric acid   Return if symptoms worsen or fail to improve.  Patient was given opportunity to ask questions. Patient verbalized understanding of the plan and was able to repeat key elements of the plan. All questions were answered to their satisfaction.   I, [name], MD, have reviewed all documentation for this visit. The documentation on 05/03/24 for the exam, diagnosis, procedures, and orders are all accurate and complete.   IF YOU HAVE BEEN REFERRED TO A SPECIALIST, IT MAY TAKE 1-2 WEEKS TO SCHEDULE/PROCESS THE REFERRAL. IF YOU HAVE NOT HEARD FROM US /SPECIALIST IN TWO WEEKS, PLEASE GIVE US  A CALL AT [phone] X [phone].   THE PATIENT IS ENCOURAGED TO PRACTICE SOCIAL DISTANCING DUE TO THE COVID-19 PANDEMIC.

## 2024-05-04 ENCOUNTER — Ambulatory Visit: Payer: Self-pay | Admitting: Internal Medicine

## 2024-05-04 ENCOUNTER — Other Ambulatory Visit: Payer: Self-pay | Admitting: Internal Medicine

## 2024-05-04 LAB — CMP14+EGFR
ALT: 26 IU/L (ref 0–32)
AST: 16 IU/L (ref 0–40)
Albumin: 4.6 g/dL (ref 3.8–4.9)
Alkaline Phosphatase: 94 IU/L (ref 44–121)
BUN/Creatinine Ratio: 18 (ref 9–23)
BUN: 14 mg/dL (ref 6–24)
Bilirubin Total: 0.4 mg/dL (ref 0.0–1.2)
CO2: 21 mmol/L (ref 20–29)
Calcium: 10 mg/dL (ref 8.7–10.2)
Chloride: 100 mmol/L (ref 96–106)
Creatinine, Ser: 0.78 mg/dL (ref 0.57–1.00)
Globulin, Total: 2.6 g/dL (ref 1.5–4.5)
Glucose: 180 mg/dL — ABNORMAL HIGH (ref 70–99)
Potassium: 3.7 mmol/L (ref 3.5–5.2)
Sodium: 142 mmol/L (ref 134–144)
Total Protein: 7.2 g/dL (ref 6.0–8.5)
eGFR: 89 mL/min/1.73 (ref >59)

## 2024-05-04 LAB — URIC ACID: Uric Acid: 6.4 mg/dL (ref 3.0–7.2)

## 2024-05-04 LAB — HEMOGLOBIN A1C
Est. average glucose Bld gHb Est-mCnc: 258 mg/dL
Hgb A1c MFr Bld: 10.6 % — ABNORMAL HIGH (ref 4.8–5.6)

## 2024-05-04 MED ORDER — MOUNJARO 5 MG/0.5ML ~~LOC~~ SOAJ
5.0000 mg | SUBCUTANEOUS | 0 refills | Status: DC
Start: 2024-05-04 — End: 2024-06-26

## 2024-05-07 NOTE — Assessment & Plan Note (Signed)
 Chronic, fair control.  Goal BP<120/80.  She will continue with amlodipine /olmesartan  10/40mg  daily.  She is also on furosemide  40mg  daily.   She is reminded to follow low sodium diet.  - Continue current antihypertensive regimen. - Follow low sodium diet.

## 2024-05-07 NOTE — Assessment & Plan Note (Signed)
 BMI 53. She is encouraged to strive to lose ten percent of her body weight to decrease cardiac risk.  Advised to aim for at least 150 minutes of exercise per week.

## 2024-05-07 NOTE — Assessment & Plan Note (Signed)
 Chronic, will check labs as below. - continue with Trulicity  3 mg and Farxiga  - We discussed possible addition of metformin and switching to Xigduo.  - She agrees to try samples if needed, metformin has caused GI distress in the past.  - Follow up in 3 months - Will need f/u sooner if metformin is started

## 2024-05-08 ENCOUNTER — Other Ambulatory Visit: Payer: Self-pay | Admitting: Internal Medicine

## 2024-05-08 DIAGNOSIS — I110 Hypertensive heart disease with heart failure: Secondary | ICD-10-CM

## 2024-05-08 DIAGNOSIS — E1169 Type 2 diabetes mellitus with other specified complication: Secondary | ICD-10-CM

## 2024-05-11 ENCOUNTER — Telehealth: Payer: Self-pay | Admitting: Pharmacist

## 2024-05-11 DIAGNOSIS — E1165 Type 2 diabetes mellitus with hyperglycemia: Secondary | ICD-10-CM

## 2024-05-11 NOTE — Progress Notes (Signed)
   05/11/2024  Patient ID: Brandi Howell, female   DOB: September 05, 1966, 58 y.o.   MRN: [id number]  Patient was called regarding diabetes management. Unfortunately, she did not answer the phone. HIPAA compliant message was left on her voicemail.  HgA1c- 10.6%-Farxiga  10 mg #30 on 04/11/24, Mounjaro  5 mg weekly  LDL-85 mg/dl-on Pitavastatin  4mg  filled on 02/03/2024 #90  Amlodipine /Olmesartan  10-40 #90 on 03/23/24  Plan: Will call Patient back in 3-5 business days.   Cassius DOROTHA Brought, PharmD, BCACP Clinical Pharmacist [phone]

## 2024-05-16 ENCOUNTER — Telehealth: Payer: Self-pay | Admitting: Pharmacist

## 2024-05-16 NOTE — Progress Notes (Signed)
   05/16/2024  Patient ID: Brandi Howell, female   DOB: 04/23/1966, 58 y.o.   MRN: [id number]  Called Patient regarding diabetes management. Unfortunately, she did not answer the phone. HIPAA compliant message was left on her voicemail.  Plan: Call Patient back in 1-2 weeks.   Cassius DOROTHA Brought, PharmD, BCACP Clinical Pharmacist [phone]

## 2024-06-07 ENCOUNTER — Telehealth: Payer: Self-pay | Admitting: Pharmacist

## 2024-06-07 DIAGNOSIS — E1169 Type 2 diabetes mellitus with other specified complication: Secondary | ICD-10-CM

## 2024-06-07 NOTE — Progress Notes (Signed)
   06/07/2024  Patient ID: Brandi Howell, female   DOB: Oct 17, 1965, 58 y.o.   MRN: [id number]  Patient was called for diabetes management per referral by PCP. Unfortunately, she did not answer her phone. HIPAA compliant message was left on her voicemail.  Hga1c- 10.6%-Mounjaro  started 05/11/24 Farxiga  10 mg On statin pitavastatin  4 mg filled 02/03/2024-should be getting refilled soon  Clinical ASCVD:  The 10-year ASCVD risk score (Arnett DK, et al., 2019) is: 11.7%   Values used to calculate the score:     Age: 36 years     Clincally relevant sex: Female     Is Non-Hispanic African American: Yes     Diabetic: Yes     Tobacco smoker: No     Systolic Blood Pressure: 128 mmHg     Is BP treated: Yes     HDL Cholesterol: 46 mg/dL     Total Cholesterol: 152 mg/dL   Brandi Howell, PharmD, BCACP Clinical Pharmacist [phone]

## 2024-06-14 ENCOUNTER — Encounter: Payer: Commercial Managed Care - HMO | Admitting: Internal Medicine

## 2024-06-21 ENCOUNTER — Telehealth: Payer: Self-pay | Admitting: Pharmacist

## 2024-06-21 DIAGNOSIS — E1165 Type 2 diabetes mellitus with hyperglycemia: Secondary | ICD-10-CM

## 2024-06-21 NOTE — Progress Notes (Signed)
   06/21/2024  Patient ID: Brandi Howell, female   DOB: June 13, 1966, 58 y.o.   MRN: [id number]  Patient was called for diabetes management per referral by PCP. Unfortunately, she did not answer her phone. HIPAA compliant message was left on her voicemail.   Hga1c- 10.6%-Mounjaro  started 05/11/24 Farxiga  10 mg 06/15/24 #30 On statin pitavastatin  4 mg filled 06/13/24 #90   Brandi Howell, PharmD, BCACP Clinical Pharmacist [phone]

## 2024-06-22 ENCOUNTER — Other Ambulatory Visit: Payer: Self-pay | Admitting: Internal Medicine

## 2024-07-04 ENCOUNTER — Encounter: Admitting: Internal Medicine

## 2024-07-12 ENCOUNTER — Encounter: Payer: Self-pay | Admitting: Internal Medicine

## 2024-07-13 ENCOUNTER — Other Ambulatory Visit: Payer: Self-pay

## 2024-07-13 MED ORDER — CONTOUR NEXT TEST VI STRP: 1.0000 | ORAL_STRIP | 11 refills | Status: AC | PRN

## 2024-07-19 ENCOUNTER — Encounter: Payer: Self-pay | Admitting: Internal Medicine

## 2024-07-26 ENCOUNTER — Encounter: Admitting: Family Medicine

## 2024-07-27 ENCOUNTER — Encounter: Payer: Self-pay | Admitting: Internal Medicine

## 2024-07-28 ENCOUNTER — Other Ambulatory Visit: Payer: Self-pay | Admitting: Internal Medicine

## 2024-08-01 ENCOUNTER — Telehealth: Payer: Self-pay | Admitting: Pharmacist

## 2024-08-01 DIAGNOSIS — E1165 Type 2 diabetes mellitus with hyperglycemia: Secondary | ICD-10-CM

## 2024-08-01 NOTE — Progress Notes (Signed)
   08/01/2024  Patient ID: Brandi Howell, female   DOB: 08-26-1966, 58 y.o.   MRN: [id number]  Patient was called regarding medication management of diabetes. HIPAA identifiers were obtained.  Patient said that she did not have time to talk at the time of my call.  We scheduled an appointment for tomorrow August 02, 2024 at 2:15 pm.   Cassius DOROTHA Brought, PharmD, [facility] Clinical Pharmacist [phone]

## 2024-08-02 ENCOUNTER — Other Ambulatory Visit: Payer: Self-pay

## 2024-08-04 ENCOUNTER — Telehealth: Payer: Self-pay | Admitting: Pharmacist

## 2024-08-04 DIAGNOSIS — E1165 Type 2 diabetes mellitus with hyperglycemia: Secondary | ICD-10-CM

## 2024-08-04 NOTE — Progress Notes (Signed)
   08/04/2024  Patient ID: Brandi Howell, female   DOB: 1965/12/12, 58 y.o.   MRN: [id number]   Patient was called to reschedule her telephonic appointment for medication management.  Unfortunately, she did not answer her phone. HIPAA compliant message was left on her voicemail.  Cassius DOROTHA Brought, PharmD, BCACP Clinical Pharmacist [phone]

## 2024-08-21 ENCOUNTER — Telehealth: Payer: Self-pay | Admitting: Pharmacist

## 2024-08-21 DIAGNOSIS — E1165 Type 2 diabetes mellitus with hyperglycemia: Secondary | ICD-10-CM

## 2024-08-21 NOTE — Progress Notes (Incomplete Revision)
   08/21/2024 Name: Brandi Howell MRN: [id number] DOB: 11-14-1965  Chief Complaint  Patient presents with   Medication Management    Diabetes    Brandi Howell is a 58 y.o. year old female who presented for a telephone visit.   They were referred to the pharmacist by their PCP for assistance in managing diabetes.    Subjective:  Brandi Howell is a 58 year old female referred for diabetes management.    Care Team: Primary Care Provider: Jarold Medici, MD ; Next Scheduled Visit: *** {careteamprovider:27366}  Medication Access/Adherence  Current Pharmacy:  [facility] #[id number] - [place], [place] - [address] AT [facility] OF [address] [place] [address] Phone: [phone] Fax: [phone]  [id number] [place], [place] - [address] AT [facility] OF [address] & [facility] [place] [address] [place] [address] Phone: [phone] Fax: [phone]  [place] [facility] [address] [place] [address] Phone: [phone] Fax: [phone]  [facility] - [place], [place] - [address] [address] Phone: [phone] Fax: [phone]   Patient reports affordability concerns with their medications: {YES/NO:21197} Patient reports access/transportation concerns to their pharmacy: {YES/NO:21197} Patient reports adherence concerns with their medications:  {YES/NO:21197} ***   {Pharmacy S/O Choices:26420}   Objective:  Lab Results  Component Value Date   HGBA1C 10.6 (H) 05/03/2024    Lab Results  Component Value Date   CREATININE 0.78 05/03/2024   BUN 14 05/03/2024   NA 142 05/03/2024   K 3.7 05/03/2024   CL 100 05/03/2024   CO2 21 05/03/2024    Lab Results  Component Value Date   CHOL 152 01/10/2024   HDL 46 01/10/2024   LDLCALC 85 01/10/2024   TRIG 116 01/10/2024   CHOLHDL  3.3 01/10/2024    Medications Reviewed Today   Medications were not reviewed in this encounter       Assessment/Plan:   Diabetes: - Currently {CHL Controlled/Uncontrolled:[phone]}; goal A1c <7%. Cardiorenal risk reduction is {DMcardiorenalrisk:33490}. Blood pressure {ACTION; IS/IS NOT:21021397} at goal <130/80. LDL {ACTION; IS/IS NOT:21021397} at goal.  - {DMinterventions:33484} - {start stop continue:33488} - {DM Med Counseling:33486:x}  Hypertension: - Currently {CHL Controlled/Uncontrolled:[phone]} - Reviewed long term cardiovascular and renal outcomes of uncontrolled blood pressure - Reviewed appropriate blood pressure monitoring technique and reviewed goal blood pressure. Recommended to check home blood pressure and heart rate *** - Recommend to ***      Hyperlipidemia/ASCVD Risk Reduction: - Currently {CHL Controlled/Uncontrolled:[phone]}.  - Reviewed long term complications of uncontrolled cholesterol - Reviewed dietary recommendations including *** - Reviewed lifestyle recommendations including *** - Recommend to ***  - Meets financial criteria for *** patient assistance program through ***. Will collaborate with provider, CPhT, and patient to pursue assistance.    -Increase Mounjaro --Patient said feeling like her appetitie is not as well supressed as it was on Ozempic   Follow Up Plan: ***  ***

## 2024-08-21 NOTE — Progress Notes (Addendum)
 09/05/2024 Name: Brandi Howell MRN: [id number] DOB: 22-Dec-1965  Chief Complaint  Patient presents with   Medication Management    Diabetes    Brandi Howell is a 58 y.o. year old female who presented for a telephone visit.   They were referred to the pharmacist by their PCP for assistance in managing diabetes.    Subjective:  Brandi Howell is a 58 year old female referred for diabetes management.  She has a past medical history significant for:  hypertension, seasonal allergies, type e diabetes, history of pulmonary embolism, asthma, diastolic heart failure, hyperlipidemia, vitamin D deficiency, and gout.  Care Team: Primary Care Provider: Jarold Medici, MD ; Next Scheduled Visit: 12/05/23   Medication Access/Adherence  Current Pharmacy:  [id number] [place], [place] - [address] AT [facility] [address] [place] [address] Phone: [phone] Fax: [phone]  [facility] #[id number] [place], [place] - [address] AT [facility] [address] & [facility] [place] [place] [address] Phone: [phone] Fax: [phone]  [place] [facility] [address] [place] [address] Phone: [phone] Fax: [phone]  [facility] - [place], [place] - [address] [place]  [address] Phone: [phone] Fax: [phone]   Patient reports affordability concerns with their medications: No  Patient reports access/transportation concerns to their pharmacy: No  Patient reports adherence concerns with their medications:  Yes      Diabetes:  Current medications:   Mounjaro  5 mg weekly, Farxiga  10 mg daily,  Lab Results  Component Value Date   HGBA1C 10.7 (H) 08/30/2024   HGBA1C 10.6 (H) 05/03/2024   HGBA1C 10.1 (H) 01/10/2024    Patient denies hypoglycemic s/sx including  dizziness,  shakiness, sweating. Patient denies hyperglycemic symptoms including  polyuria, polydipsia, polyphagia, nocturia, neuropathy, blurred vision.  Macrovascular and Microvascular Risk Reduction:  Statin? yes (Pitavasatin 4 mg last filled 06/13/24 90 day supply); ACEi/ARB? yes (Amlodipine brendia) Last urinary albumin/creatinine ratio:  Lab Results  Component Value Date   MICRALBCREAT 103 (H) 08/30/2024   MICRALBCREAT 5 06/10/2023   MICRALBCREAT <5 04/27/2022   MICRALBCREAT Comment (A) 12/23/2021   MICRALBCREAT 30 12/17/2020   MICRALBCREAT 30-300 07/19/2019   Last eye exam:  Lab Results  Component Value Date   HMDIABEYEEXA No Retinopathy 05/19/2022   Last foot exam: No foot exam found Tobacco Use:  Tobacco Use: Low Risk  (08/30/2024)   Patient History    Smoking Tobacco Use: Never    Smokeless Tobacco Use: Never    Passive Exposure: Not on file     Objective:  Lab Results  Component Value Date   HGBA1C 10.7 (H) 08/30/2024    Lab Results  Component Value Date   CREATININE 0.85 08/30/2024   BUN 16 08/30/2024   NA 140 08/30/2024   K 3.9 08/30/2024   CL 99 08/30/2024   CO2 25 08/30/2024    Lab Results  Component Value Date   CHOL 175 08/30/2024   HDL 49 08/30/2024   LDLCALC 87 08/30/2024   TRIG 234 (H) 08/30/2024   CHOLHDL 3.6 08/30/2024    Medications Reviewed Today     Reviewed by Jolee Cassius PARAS, [facility] (Pharmacist) on 09/05/24 at 0528  Med List Status: <None>   Medication Order Taking? Sig Documenting Provider Last Dose Status Informant  albuterol  (VENTOLIN  HFA) 108 (90  Base) MCG/ACT inhaler [id number] Yes Inhale 2 puffs into the lungs every 6 (six) hours as needed for wheezing or shortness of breath. [provider]  Active   allopurinol  (ZYLOPRIM ) 100 MG tablet [id number] Yes Take 1 tablet (100 mg total) by mouth daily. Petrina Pries, NP  Expired 08/30/24 2359   amLODipine -olmesartan  (AZOR ) 10-40 MG tablet [id number] Yes TAKE 1 TABLET BY MOUTH DAILY  Jarold Medici, MD  Active   aspirin  EC 81 MG tablet [id number] Yes Take 1 tablet (81 mg total) by mouth daily. Swallow whole. Lonni Slain, MD  Active   Blood Glucose Monitoring Suppl (ACCU-CHEK GUIDE ME) w/Device PRESSLEY [id number]  CHECK BLOOD SUGARS ONCE DAILY. Jarold Medici, MD  Active   budesonide -formoterol  (SYMBICORT ) 160-4.5 MCG/ACT inhaler [id number]  Inhale 2 puffs into the lungs as needed.  Patient not taking: Reported on 08/30/2024   Jarold Medici, MD  Active            Med Note ANTHONEY, ROBYN N   Wed Aug 30, 2024 11:23 AM) Pt states Pulmonary advised to take prn.   cetirizine (ZYRTEC) 10 MG tablet [id number] Yes Take 10 mg by mouth as needed for allergies. [provider]  Active Self  FARXIGA  10 MG TABS tablet [id number] Yes TAKE 1 TABLET(10 MG) BY MOUTH DAILY BEFORE OFILIA Jarold Medici, MD  Active   furosemide  (LASIX ) 40 MG tablet [id number] Yes Take 1 tablet (40 mg total) by mouth daily. Jarold Medici, MD  Active   glucose blood (ACCU-CHEK GUIDE TEST) test strip [id number]  USE ONCE DAILY TO CHECK BLOOD SUGARS. Jarold Medici, MD  Active   glucose blood (CONTOUR NEXT TEST) test strip [id number] Yes 1 each by Other route as needed for other. Use as instructed to check blood sugars daily dx: e11.9 Jarold Medici, MD  Active   levocetirizine (XYZAL ) 5 MG tablet [id number] Yes Take 1 tablet (5 mg total) by mouth every evening. Jarold Medici, MD  Active   Microlet Lancets MISC [id number] Yes Use as instructed to check blood sugars daily dx: e11.9 Jarold Medici, MD  Active   montelukast  (SINGULAIR ) 10 MG tablet [id number] Yes TAKE 1 TABLET BY MOUTH DAILY Jarold Medici, MD  Active   OXYGEN  [id number] Yes at bedtime. 2lpm 24/7 [facility] [provider]  Active   Pitavastatin  Calcium  4 MG TABS [id number] Yes Take 1 tablet (4 mg total) by mouth daily. Jarold Medici, MD  Active   spironolactone  (ALDACTONE ) 25 MG tablet [id number] Yes TAKE 1 TABLET(25 MG) BY MOUTH DAILY. NEED  APPOINTMENT FOR REFILLS Jarold Medici, MD  Active   tirzepatide  (MOUNJARO ) 7.5 MG/0.5ML Pen [id number]  Inject 7.5 mg into the skin once a week. Jarold Medici, MD  Active   UNABLE TO FIND [id number] Yes Med Name: CPAP with o2 bled in 2lpm [provider]  Active               08/30/2024   11:32 AM 08/30/2024   11:07 AM 05/03/2024   10:17 AM  Vitals with BMI  Height  5' 2 5' 2  Weight  288 lbs 10 oz 290 lbs  BMI  52.77 53.03  Systolic 128 140 871  Diastolic 82 78 82  Pulse   94      Assessment/Plan:   Diabetes: - Currently uncontrolled; goal A1c <7%. Cardiorenal risk reduction is optimized.. Blood pressure is at goal <130/80. LDL is at goal.  - Reviewed goal A1c, goal fasting, and goal 2 hour post prandial glucose. Recommended to check  glucose twice daily - Recommend to start Moujaro 7.5mg  (increase from 5 mg) .   Hypertension: - Currently controlled -continue current therapy  Hyperlipidemia/ASCVD Risk Reduction: - Currently uncontrolled. (Triglycerides 234 mg/dl) - Reviewed long term complications of uncontrolled cholesterol - Recommend to continue current therapy but decrease simple carbs. Hopefull as blood sugar control gets better her triglycerides will decrease.   Follow Up Plan:    Call Patient in 2-3 weeks.  Cassius DOROTHA Brought, PharmD, BCACP Clinical Pharmacist [phone]

## 2024-08-23 ENCOUNTER — Other Ambulatory Visit: Payer: Self-pay

## 2024-08-23 DIAGNOSIS — E1169 Type 2 diabetes mellitus with other specified complication: Secondary | ICD-10-CM

## 2024-08-23 MED ORDER — ACCU-CHEK GUIDE ME W/DEVICE KIT: PACK | 1 refills | Status: AC

## 2024-08-30 ENCOUNTER — Ambulatory Visit
Admission: RE | Admit: 2024-08-30 | Discharge: 2024-08-30 | Disposition: A | Source: Ambulatory Visit | Attending: Internal Medicine | Admitting: Internal Medicine

## 2024-08-30 ENCOUNTER — Ambulatory Visit: Payer: Self-pay | Admitting: Internal Medicine

## 2024-08-30 ENCOUNTER — Encounter: Payer: Self-pay | Admitting: Internal Medicine

## 2024-08-30 VITALS — BP 128/82 | Temp 98.3°F | Ht 62.0 in | Wt 288.6 lb

## 2024-08-30 DIAGNOSIS — R202 Paresthesia of skin: Secondary | ICD-10-CM

## 2024-08-30 DIAGNOSIS — E1165 Type 2 diabetes mellitus with hyperglycemia: Secondary | ICD-10-CM

## 2024-08-30 DIAGNOSIS — E1169 Type 2 diabetes mellitus with other specified complication: Secondary | ICD-10-CM

## 2024-08-30 DIAGNOSIS — G8929 Other chronic pain: Secondary | ICD-10-CM

## 2024-08-30 DIAGNOSIS — I110 Hypertensive heart disease with heart failure: Secondary | ICD-10-CM | POA: Diagnosis not present

## 2024-08-30 DIAGNOSIS — Z0000 Encounter for general adult medical examination without abnormal findings: Secondary | ICD-10-CM | POA: Diagnosis not present

## 2024-08-30 DIAGNOSIS — E049 Nontoxic goiter, unspecified: Secondary | ICD-10-CM | POA: Diagnosis not present

## 2024-08-30 DIAGNOSIS — I5032 Chronic diastolic (congestive) heart failure: Secondary | ICD-10-CM | POA: Diagnosis not present

## 2024-08-30 DIAGNOSIS — M25562 Pain in left knee: Secondary | ICD-10-CM | POA: Diagnosis not present

## 2024-08-30 DIAGNOSIS — M25561 Pain in right knee: Secondary | ICD-10-CM | POA: Diagnosis not present

## 2024-08-30 DIAGNOSIS — E785 Hyperlipidemia, unspecified: Secondary | ICD-10-CM | POA: Diagnosis not present

## 2024-08-30 LAB — POCT URINALYSIS DIPSTICK
Bilirubin, UA: NEGATIVE
Glucose, UA: POSITIVE — AB
Ketones, UA: NEGATIVE
Leukocytes, UA: NEGATIVE
Nitrite, UA: NEGATIVE
Protein, UA: POSITIVE — AB
Spec Grav, UA: 1.015 (ref 1.010–1.025)
Urobilinogen, UA: 0.2 U/dL
pH, UA: 5.5 (ref 5.0–8.0)

## 2024-08-30 MED ORDER — MOUNJARO 7.5 MG/0.5ML ~~LOC~~ SOAJ: 7.5000 mg | SUBCUTANEOUS | 1 refills | Status: DC

## 2024-08-30 NOTE — Patient Instructions (Signed)

## 2024-08-30 NOTE — Progress Notes (Unsigned)
 I,Brandi Howell, CMA,acting as a neurosurgeon for Brandi LOISE Slocumb, MD.,have documented Howell relevant documentation on the behalf of Brandi LOISE Slocumb, MD,as directed by  Brandi LOISE Slocumb, MD while in the presence of Brandi LOISE Slocumb, MD.  Subjective:    Patient ID: Brandi Howell , female    DOB: 21-May-1966 , 58 y.o.   MRN: [id number]  Chief Complaint  Patient presents with   Annual Exam    She is here today for a full physical examination. She is followed by Dr. Ovid Howell for her pelvic exams. She reports compliance with meds. She denies headaches, chest pain and shortness of breath.  Letter sent for pap result.    Hypertension   Hyperlipidemia   Diabetes    HPI Discussed the use of AI scribe software for clinical note transcription with the patient, who gave verbal consent to proceed.  History of Present Illness Brandi Howell is a 58 year old female with diabetes who presents for a physical and diabetes check.  She is experiencing issues obtaining test strips for her glucose monitoring device. Her daughter picked up the ordered supplies, but the strips were not included. She needs to scan a code at the pharmacy to receive the strips at a discounted price and is trying to resolve it through Medicare coverage.  She is currently taking Azor  10/40 mg, aspirin , Farxiga , Mounjaro  5 mg, Lasix  40 mg daily, Singulair , and spironolactone . She has reduced her Lasix  dose from 80 mg to 40 mg due to excessive effects. Her blood pressure readings at home are usually low, with diastolic readings in the 60s, and she feels 'woozy' at times. Typical home readings are around 130/68-70 mmHg. She is not currently exercising.  She experiences knee pain, particularly in the right knee, affecting her ability to walk. She uses pain patches for relief and has consulted about potential treatments like PRP. Previous treatments like steroid injections have not provided relief.  She received her flu and COVID  vaccines at the pharmacy. She has not completed a thyroid  ultrasound ordered in April 2023. She plans to start walking for exercise but is concerned about her knees.  She reports a decrease in burning sensations in her feet since switching from Ozempic  to Mounjaro .  No menstrual periods for a year, with occasional spotting last year. She experiences hot flashes. No issues with urination.   Diabetes She presents for her follow-up diabetic visit. She has type 2 diabetes mellitus. There are no hypoglycemic associated symptoms. Pertinent negatives for diabetes include no blurred vision and no chest pain. There are no hypoglycemic complications. Risk factors for coronary artery disease include diabetes mellitus, dyslipidemia, hypertension, obesity and sedentary lifestyle. She is following a diabetic diet. She participates in exercise intermittently. Eye exam is not current.  Hypertension This is a chronic problem. The current episode started more than 1 month ago. The problem has been gradually improving since onset. The problem is controlled. Pertinent negatives include no blurred vision, chest pain or palpitations. Risk factors for coronary artery disease include sedentary lifestyle, diabetes mellitus, dyslipidemia, obesity and post-menopausal state. The current treatment provides moderate improvement. Hypertensive end-organ damage includes heart failure.     Past Medical History:  Diagnosis Date   Asthma    Diabetes mellitus without complication (HCC)    Goiter    Gout    Hypercholesteremia    Hypertension    Vitamin D deficiency      Family History  Problem Relation Age of Onset  Hypertension Mother    Hyperlipidemia Mother    Heart disease Father      Current Outpatient Medications:    albuterol  (VENTOLIN  HFA) 108 (90 Base) MCG/ACT inhaler, Inhale 2 puffs into the lungs every 6 (six) hours as needed for wheezing or shortness of breath., Disp: , Rfl:    allopurinol  (ZYLOPRIM ) 100 MG  tablet, Take 1 tablet (100 mg total) by mouth daily., Disp: 90 tablet, Rfl: 2   amLODipine -olmesartan  (AZOR ) 10-40 MG tablet, TAKE 1 TABLET BY MOUTH DAILY, Disp: 90 tablet, Rfl: 2   aspirin  EC 81 MG tablet, Take 1 tablet (81 mg total) by mouth daily. Swallow whole., Disp: , Rfl:    Blood Glucose Monitoring Suppl (ACCU-CHEK GUIDE ME) w/Device KIT, CHECK BLOOD SUGARS ONCE DAILY., Disp: 1 kit, Rfl: 1   cetirizine (ZYRTEC) 10 MG tablet, Take 10 mg by mouth as needed for allergies., Disp: , Rfl:    FARXIGA  10 MG TABS tablet, TAKE 1 TABLET(10 MG) BY MOUTH DAILY BEFORE BREAKFAST, Disp: 30 tablet, Rfl: 5   furosemide  (LASIX ) 40 MG tablet, Take 1 tablet (40 mg total) by mouth daily., Disp: 90 tablet, Rfl: 32   glucose blood (CONTOUR NEXT TEST) test strip, 1 each by Other route as needed for other. Use as instructed to check blood sugars daily dx: e11.9, Disp: 100 each, Rfl: 11   levocetirizine (XYZAL ) 5 MG tablet, Take 1 tablet (5 mg total) by mouth every evening., Disp: 90 tablet, Rfl: 1   Microlet Lancets MISC, Use as instructed to check blood sugars daily dx: e11.9, Disp: 100 each, Rfl: 11   montelukast  (SINGULAIR ) 10 MG tablet, TAKE 1 TABLET BY MOUTH DAILY, Disp: 90 tablet, Rfl: 3   OXYGEN , at bedtime. 2lpm 24/7 [facility], Disp: , Rfl:    Pitavastatin  Calcium  4 MG TABS, Take 1 tablet (4 mg total) by mouth daily., Disp: 90 tablet, Rfl: 3   spironolactone  (ALDACTONE ) 25 MG tablet, TAKE 1 TABLET(25 MG) BY MOUTH DAILY. NEED APPOINTMENT FOR REFILLS, Disp: 90 tablet, Rfl: 2   tirzepatide  (MOUNJARO ) 7.5 MG/0.5ML Pen, Inject 7.5 mg into the skin once a week., Disp: 2 mL, Rfl: 1   UNABLE TO FIND, Med Name: CPAP with o2 bled in 2lpm, Disp: , Rfl:    budesonide -formoterol  (SYMBICORT ) 160-4.5 MCG/ACT inhaler, Inhale 2 puffs into the lungs as needed. (Patient not taking: Reported on 08/30/2024), Disp: 1 each, Rfl: 3   glucose blood (ACCU-CHEK GUIDE TEST) test strip, USE ONCE DAILY TO CHECK BLOOD SUGARS., Disp: 100 each,  Rfl: 12   Allergies  Allergen Reactions   Lisinopril Cough   Statins Other (See Comments)    Cause muscle weakness      The patient states she uses post menopausal status for birth control. No LMP recorded (approximate). Patient is postmenopausal.. Negative for Dysmenorrhea. Negative for: breast discharge, breast lump(s), breast pain and breast self exam. Associated symptoms include abnormal vaginal bleeding. Pertinent negatives include abnormal bleeding (hematology), anxiety, decreased libido, depression, difficulty falling sleep, dyspareunia, history of infertility, nocturia, sexual dysfunction, sleep disturbances, urinary incontinence, urinary urgency, vaginal discharge and vaginal itching. Diet regular.The patient states her exercise level is  intermittent.  . The patient's tobacco use is:  Social History   Tobacco Use  Smoking Status Never  Smokeless Tobacco Never  . She has been exposed to passive smoke. The patient's alcohol use is:  Social History   Substance and Sexual Activity  Alcohol Use Yes   Comment: occ    Review of Systems  Constitutional: Negative.   HENT: Negative.    Eyes: Negative.  Negative for blurred vision.  Respiratory: Negative.    Cardiovascular: Negative.  Negative for chest pain and palpitations.  Gastrointestinal: Negative.   Endocrine: Negative.   Genitourinary: Negative.   Musculoskeletal: Negative.   Skin: Negative.   Allergic/Immunologic: Negative.   Neurological: Negative.   Hematological: Negative.   Psychiatric/Behavioral: Negative.       Today's Vitals   08/30/24 1107 08/30/24 1132  BP: (!) 140/78 128/82  Temp: 98.3 F (36.8 C)   SpO2: 98%   Weight: 288 lb 9.6 oz (130.9 kg)   Height: 5' 2 (1.575 m)    Body mass index is 52.79 kg/m.  Wt Readings from Last 3 Encounters:  08/30/24 288 lb 9.6 oz (130.9 kg)  05/03/24 290 lb (131.5 kg)  01/25/24 290 lb 4.8 oz (131.7 kg)     Objective:  Physical Exam Vitals and nursing note  reviewed.  Constitutional:      Appearance: Normal appearance. She is obese.  HENT:     Head: Normocephalic and atraumatic.     Right Ear: Tympanic membrane, ear canal and external ear normal.     Left Ear: Tympanic membrane, ear canal and external ear normal.     Nose: Nose normal.     Mouth/Throat:     Mouth: Mucous membranes are moist.     Pharynx: Oropharynx is clear.  Eyes:     Extraocular Movements: Extraocular movements intact.     Conjunctiva/sclera: Conjunctivae normal.     Pupils: Pupils are equal, round, and reactive to light.  Cardiovascular:     Rate and Rhythm: Normal rate and regular rhythm.     Pulses: Normal pulses.          Dorsalis pedis pulses are 2+ on the right side and 2+ on the left side.     Heart sounds: Normal heart sounds.  Pulmonary:     Effort: Pulmonary effort is normal.     Breath sounds: Normal breath sounds.  Chest:  Breasts:    Right: Normal.     Left: Normal.     Comments: She kept her bra on Abdominal:     General: Bowel sounds are normal.     Palpations: Abdomen is soft.     Comments: Obese, soft.   Genitourinary:    Comments: deferred Musculoskeletal:        General: Normal range of motion.     Cervical back: Normal range of motion and neck supple.  Feet:     Right foot:     Protective Sensation: 5 sites tested.  5 sites sensed.     Skin integrity: Callus and dry skin present.     Toenail Condition: Right toenails are long.     Left foot:     Protective Sensation: 5 sites tested.  5 sites sensed.     Skin integrity: Callus and dry skin present.     Toenail Condition: Left toenails are long.  Skin:    General: Skin is warm and dry.  Neurological:     General: No focal deficit present.     Mental Status: She is alert and oriented to person, place, and time.  Psychiatric:        Mood and Affect: Mood normal.        Behavior: Behavior normal.         Assessment And Plan:     Annual physical exam Assessment & Plan: A full  exam was performed.  Importance of monthly self breast exams was discussed with the patient.  She is advised to get 30-45 minutes of regular exercise, no less than four to five days per week. Both weight-bearing and aerobic exercises are recommended.  She is advised to follow a healthy diet with at least six fruits/veggies per day, decrease intake of red meat and other saturated fats and to increase fish intake to twice weekly.  Meats/fish should not be fried -- baked, boiled or broiled is preferable. It is also important to cut back on your sugar intake.  Be sure to read labels - try to avoid anything with added sugar, high fructose corn syrup or other sweeteners.  If you must use a sweetener, you can try stevia or monkfruit.  It is also important to avoid artificially sweetened foods/beverages and diet drinks. Lastly, wear SPF 50 sunscreen on exposed skin and when in direct sunlight for an extended period of time.  Be sure to avoid fast food restaurants and aim for at least 60 ounces of water  daily.    - Requested Pap smear. - Encouraged regular exercise.   Orders: -     CBC -     CMP14+EGFR -     Lipid panel -     Hemoglobin A1c  Hypertensive heart disease with chronic diastolic congestive heart failure (HCC) Assessment & Plan: Chronic, fair control.  Goal BP <130/80.  EKG performed, NSR w/ nonspecific ST depression and nonspecific T abnormality. Managed with Azor  and Lasix . Occasional low blood pressure readings at home. Follow-up with cardiologist noted. - Continue Azor  10/40 mg. - Continue Lasix  40 mg. - Encouraged regular blood pressure monitoring at home. - Encouraged regular exercise.  Orders: -     POCT urinalysis dipstick -     Microalbumin / creatinine urine ratio -     EKG 12-Lead  Hyperlipidemia associated with type 2 diabetes mellitus (HCC) Assessment & Plan: Chronic, diabetic foot exam was performed.  Issues obtaining glucose test strips. Current medication Mounjaro  5 mg.  Reduced paresthesia in feet since switching to Mounjaro . - Sent prescription for glucose test strips and lancets. - Continue Mounjaro  5 mg. - Encouraged prioritizing protein intake. - Checked B12 levels.   Goiter Assessment & Plan: Chronic, has h/o multinodular goiter. Two nodules have been biopsied in the past.   - Order thyroid  ultrasound. - Follow up on imaging order status if not contacted within a week.   Chronic pain of both knees Assessment & Plan: Chronic pain on right worse than left. Previous steroid injections and PRP consultation. Considering PRP treatment. - Referred to [facility] for PRP consultation.  Orders: -     Ambulatory referral to Orthopedic Surgery  Paresthesia Assessment & Plan: Intermittent paresthesia in feet, reduced since switching to Mounjaro . Possible neuropathy considered. - Checked B12 levels.  Orders: -     Vitamin B12  Morbid obesity (HCC) Assessment & Plan: BMI 52.  She is encouraged to strive to lose ten percent of her body weight to decrease cardiac risk.    Other orders -     Mounjaro ; Inject 7.5 mg into the skin once a week.  Dispense: 2 mL; Refill: 1   Return for 1 year physical, 3 month dm check. Patient was given opportunity to ask questions. Patient verbalized understanding of the plan and was able to repeat key elements of the plan. Howell questions were answered to their satisfaction.   I, Brandi LOISE Slocumb, MD, have reviewed Howell  documentation for this visit. The documentation on 08/30/24 for the exam, diagnosis, procedures, and orders are Howell accurate and complete.

## 2024-08-31 LAB — CMP14+EGFR
ALT: 31 IU/L (ref 0–32)
AST: 16 IU/L (ref 0–40)
Albumin: 4.8 g/dL (ref 3.8–4.9)
Alkaline Phosphatase: 89 IU/L (ref 49–135)
BUN/Creatinine Ratio: 19 (ref 9–23)
BUN: 16 mg/dL (ref 6–24)
Bilirubin Total: 0.3 mg/dL (ref 0.0–1.2)
CO2: 25 mmol/L (ref 20–29)
Calcium: 9.9 mg/dL (ref 8.7–10.2)
Chloride: 99 mmol/L (ref 96–106)
Creatinine, Ser: 0.85 mg/dL (ref 0.57–1.00)
Globulin, Total: 2.6 g/dL (ref 1.5–4.5)
Glucose: 260 mg/dL — ABNORMAL HIGH (ref 70–99)
Potassium: 3.9 mmol/L (ref 3.5–5.2)
Sodium: 140 mmol/L (ref 134–144)
Total Protein: 7.4 g/dL (ref 6.0–8.5)
eGFR: 80 mL/min/1.73 (ref >59)

## 2024-08-31 LAB — CBC
Hematocrit: 43.6 % (ref 34.0–46.6)
Hemoglobin: 13.1 g/dL (ref 11.1–15.9)
MCH: 25.1 pg — ABNORMAL LOW (ref 26.6–33.0)
MCHC: 30 g/dL — ABNORMAL LOW (ref 31.5–35.7)
MCV: 84 fL (ref 79–97)
Platelets: 209 x10E3/uL (ref 150–450)
RBC: 5.21 x10E6/uL (ref 3.77–5.28)
RDW: 14.8 % (ref 11.7–15.4)
WBC: 6.8 x10E3/uL (ref 3.4–10.8)

## 2024-08-31 LAB — HEMOGLOBIN A1C
Est. average glucose Bld gHb Est-mCnc: 260 mg/dL
Hgb A1c MFr Bld: 10.7 % — ABNORMAL HIGH (ref 4.8–5.6)

## 2024-08-31 LAB — VITAMIN B12: Vitamin B-12: 548 pg/mL (ref 232–1245)

## 2024-08-31 LAB — MICROALBUMIN / CREATININE URINE RATIO
Creatinine, Urine: 63.6 mg/dL
Microalb/Creat Ratio: 103 mg/g{creat} — ABNORMAL HIGH (ref 0–29)
Microalbumin, Urine: 65.5 ug/mL

## 2024-08-31 LAB — LIPID PANEL
Chol/HDL Ratio: 3.6 ratio (ref 0.0–4.4)
Cholesterol, Total: 175 mg/dL (ref 100–199)
HDL: 49 mg/dL (ref >39)
LDL Chol Calc (NIH): 87 mg/dL (ref 0–99)
Triglycerides: 234 mg/dL — ABNORMAL HIGH (ref 0–149)
VLDL Cholesterol Cal: 39 mg/dL (ref 5–40)

## 2024-09-01 ENCOUNTER — Other Ambulatory Visit: Payer: Self-pay

## 2024-09-01 DIAGNOSIS — E1169 Type 2 diabetes mellitus with other specified complication: Secondary | ICD-10-CM

## 2024-09-01 MED ORDER — ACCU-CHEK GUIDE TEST VI STRP: ORAL_STRIP | 12 refills | Status: DC

## 2024-09-04 ENCOUNTER — Ambulatory Visit: Payer: Self-pay | Admitting: Internal Medicine

## 2024-09-05 DIAGNOSIS — R202 Paresthesia of skin: Secondary | ICD-10-CM | POA: Insufficient documentation

## 2024-09-05 DIAGNOSIS — G8929 Other chronic pain: Secondary | ICD-10-CM | POA: Insufficient documentation

## 2024-09-05 NOTE — Assessment & Plan Note (Addendum)
 A full exam was performed.  Importance of monthly self breast exams was discussed with the patient.  She is advised to get 30-45 minutes of regular exercise, no less than four to five days per week. Both weight-bearing and aerobic exercises are recommended.  She is advised to follow a healthy diet with at least six fruits/veggies per day, decrease intake of red meat and other saturated fats and to increase fish intake to twice weekly.  Meats/fish should not be fried -- baked, boiled or broiled is preferable. It is also important to cut back on your sugar intake.  Be sure to read labels - try to avoid anything with added sugar, high fructose corn syrup or other sweeteners.  If you must use a sweetener, you can try stevia or monkfruit.  It is also important to avoid artificially sweetened foods/beverages and diet drinks. Lastly, wear SPF 50 sunscreen on exposed skin and when in direct sunlight for an extended period of time.  Be sure to avoid fast food restaurants and aim for at least 60 ounces of water  daily.    - Requested Pap smear. - Encouraged regular exercise.

## 2024-09-05 NOTE — Assessment & Plan Note (Signed)
 Chronic, has h/o multinodular goiter. Two nodules have been biopsied in the past.   - Order thyroid  ultrasound. - Follow up on imaging order status if not contacted within a week.

## 2024-09-05 NOTE — Assessment & Plan Note (Signed)
 Intermittent paresthesia in feet, reduced since switching to Mounjaro . Possible neuropathy considered. - Checked B12 levels.

## 2024-09-05 NOTE — Assessment & Plan Note (Signed)
 Chronic, diabetic foot exam was performed.  Issues obtaining glucose test strips. Current medication Mounjaro  5 mg. Reduced paresthesia in feet since switching to Mounjaro . - Sent prescription for glucose test strips and lancets. - Continue Mounjaro  5 mg. - Encouraged prioritizing protein intake. - Checked B12 levels.

## 2024-09-05 NOTE — Assessment & Plan Note (Signed)
 BMI 52.  She is encouraged to strive to lose ten percent of her body weight to decrease cardiac risk.

## 2024-09-05 NOTE — Assessment & Plan Note (Signed)
 Chronic pain on right worse than left. Previous steroid injections and PRP consultation. Considering PRP treatment. - Referred to [facility] for PRP consultation.

## 2024-09-05 NOTE — Assessment & Plan Note (Signed)
 Chronic, fair control.  Goal BP <130/80.  EKG performed, NSR w/ nonspecific ST depression and nonspecific T abnormality. Managed with Azor  and Lasix . Occasional low blood pressure readings at home. Follow-up with cardiologist noted. - Continue Azor  10/40 mg. - Continue Lasix  40 mg. - Encouraged regular blood pressure monitoring at home. - Encouraged regular exercise.

## 2024-09-12 ENCOUNTER — Other Ambulatory Visit: Payer: Self-pay

## 2024-09-12 DIAGNOSIS — E1169 Type 2 diabetes mellitus with other specified complication: Secondary | ICD-10-CM

## 2024-09-12 MED ORDER — ACCU-CHEK GUIDE TEST VI STRP: ORAL_STRIP | 12 refills | Status: AC

## 2024-09-18 ENCOUNTER — Encounter (HOSPITAL_COMMUNITY): Payer: Self-pay

## 2024-09-18 ENCOUNTER — Ambulatory Visit (HOSPITAL_COMMUNITY): Admission: EM | Admit: 2024-09-18 | Discharge: 2024-09-18 | Disposition: A | Discharge status: Home / Self Care

## 2024-09-18 DIAGNOSIS — W19XXXA Unspecified fall, initial encounter: Secondary | ICD-10-CM | POA: Diagnosis not present

## 2024-09-18 DIAGNOSIS — M25511 Pain in right shoulder: Secondary | ICD-10-CM | POA: Diagnosis not present

## 2024-09-18 MED ORDER — MELOXICAM 15 MG PO TABS: 15.0000 mg | ORAL_TABLET | Freq: Every day | ORAL | 0 refills | Status: DC

## 2024-09-18 MED ORDER — CYCLOBENZAPRINE HCL 10 MG PO TABS: 10.0000 mg | ORAL_TABLET | Freq: Every evening | ORAL | 0 refills | Status: AC | PRN

## 2024-09-18 NOTE — ED Triage Notes (Signed)
 Patient here today with c/o right upper arm pain after falling Saturday night. Patient has been taking Ibuprofen and Tylenol  with some relief. Has not noticed any bruising. ROM increases pain.

## 2024-09-18 NOTE — ED Provider Notes (Signed)
 " [facility]    CSN: [id number] Arrival date & time: 09/18/24  0831      History   Chief Complaint No chief complaint on file.   HPI Brandi Howell is a 58 y.o. female.   HPI  Patient is a 58 year old female with a past medical history of but not limited to diabetes, asthma, hypercholesterolemia, chronic musculoskeletal pain, obesity, HFpEF who presents today complaining of right upper arm pain after a fall that occurred on Saturday night.  Patient has been taking ibuprofen and Tylenol  with some relief since then.  Has not noticed any bruising.  States trying to use her arm increases its pain.  Patient states initial injury occurred Saturday night while at church.  She went to sit down on a chair and it slowly collapsed and she tried to catch herself using her right arm.  Initially did not have severe pain after fall but over the next hour she developed worsening right shoulder pain and had increasing difficulty performing overhead movements.  Since then has been getting worse and worse and she has trouble abducting her arm greater than 80 degrees.  Denies any pain to palpation.  Neurovascular intact distally.  Denies any radicular pain past the elbow.  Denies any bruising or evidence of skin changes.  Past Medical History:  Diagnosis Date   Asthma    Diabetes mellitus without complication (HCC)    Goiter    Gout    Hypercholesteremia    Hypertension    Vitamin D deficiency     Patient Active Problem List   Diagnosis Date Noted   Chronic pain of both knees 09/05/2024   Paresthesia 09/05/2024   Seasonal allergies 01/10/2024   Sinus congestion 10/02/2023   Annual physical exam 06/19/2023   Hypertensive heart disease with chronic diastolic congestive heart failure (HCC) 09/17/2020   Uncontrolled type 2 diabetes mellitus with hyperglycemia (HCC) 12/25/2019   History of pulmonary embolism 10/05/2018   OSA on CPAP 06/07/2018   Chronic diastolic heart failure  (HCC) 92/88/7980   Class 3 severe obesity due to excess calories with serious comorbidity and body mass index (BMI) of 50.0 to 59.9 in adult (HCC) 04/07/2018   Cough variant asthma 04/06/2018   Chronic respiratory failure with hypoxia and hypercapnia (HCC) 04/06/2018   Pulmonary infiltrates assoc with non-occlusive  PE  04/05/2018   (HFpEF) heart failure with preserved ejection fraction (HCC) 02/20/2018   Pneumonia 02/20/2018   Asthma 02/19/2018   Morbid obesity (HCC) 02/19/2018   Diabetes (HCC) 02/19/2018   Hyperlipidemia associated with type 2 diabetes mellitus (HCC) 02/19/2018   Goiter 02/19/2018   Gout 02/19/2018   Vitamin D deficiency 02/19/2018   Essential hypertension 02/19/2018   Acute medial meniscus tear of left knee 10/22/2017    Past Surgical History:  Procedure Laterality Date   CHOLECYSTECTOMY     KNEE ARTHROSCOPY     KNEE ARTHROSCOPY Left 10/25/2017   Procedure: LEFT KNEE ARTHROSCOPY WITH MEDIAL MENISECTOMY, CHONDROPLASTY;  Surgeon: Liam Lerner, MD;  Location: [facility];  Service: Orthopedics;  Laterality: Left;   TUBAL LIGATION      OB History   No obstetric history on file.      Home Medications    Prior to Admission medications  Medication Sig Start Date End Date Taking? Authorizing Provider  albuterol  (VENTOLIN  HFA) 108 (90 Base) MCG/ACT inhaler Inhale 2 puffs into the lungs every 6 (six) hours as needed for wheezing or shortness of breath.    [provider]  allopurinol  (ZYLOPRIM ) 100 MG tablet Take 1 tablet (100 mg total) by mouth daily. 11/25/23 08/30/24  Petrina Pries, NP  amLODipine -olmesartan  (AZOR ) 10-40 MG tablet TAKE 1 TABLET BY MOUTH DAILY 03/22/24   Jarold Medici, MD  aspirin  EC 81 MG tablet Take 1 tablet (81 mg total) by mouth daily. Swallow whole. 01/25/24   Lonni Slain, MD  Blood Glucose Monitoring Suppl (ACCU-CHEK GUIDE ME) w/Device KIT CHECK BLOOD SUGARS ONCE DAILY. 08/23/24   Jarold Medici, MD  budesonide -formoterol   (SYMBICORT ) 160-4.5 MCG/ACT inhaler Inhale 2 puffs into the lungs as needed. Patient not taking: Reported on 08/30/2024 06/10/23   Jarold Medici, MD  cetirizine (ZYRTEC) 10 MG tablet Take 10 mg by mouth as needed for allergies.    [provider]  FARXIGA  10 MG TABS tablet TAKE 1 TABLET(10 MG) BY MOUTH DAILY BEFORE BREAKFAST 04/10/24   Jarold Medici, MD  furosemide  (LASIX ) 40 MG tablet Take 1 tablet (40 mg total) by mouth daily. 01/26/24   Jarold Medici, MD  glucose blood (ACCU-CHEK GUIDE TEST) test strip USE ONCE DAILY TO CHECK BLOOD SUGARS. 09/12/24   Jarold Medici, MD  glucose blood (CONTOUR NEXT TEST) test strip 1 each by Other route as needed for other. Use as instructed to check blood sugars daily dx: e11.9 07/13/24   Jarold Medici, MD  levocetirizine (XYZAL ) 5 MG tablet Take 1 tablet (5 mg total) by mouth every evening. 12/17/20   Jarold Medici, MD  Microlet Lancets MISC Use as instructed to check blood sugars daily dx: e11.9 12/23/21   Jarold Medici, MD  montelukast  (SINGULAIR ) 10 MG tablet TAKE 1 TABLET BY MOUTH DAILY 05/08/24   Jarold Medici, MD  OXYGEN  at bedtime. 2lpm 24/7 [facility]    [provider]  Pitavastatin  Calcium  4 MG TABS Take 1 tablet (4 mg total) by mouth daily. 11/29/23   Jarold Medici, MD  spironolactone  (ALDACTONE ) 25 MG tablet TAKE 1 TABLET(25 MG) BY MOUTH DAILY. NEED APPOINTMENT FOR REFILLS 02/22/24   Jarold Medici, MD  tirzepatide  (MOUNJARO ) 7.5 MG/0.5ML Pen Inject 7.5 mg into the skin once a week. 08/30/24   Jarold Medici, MD  UNABLE TO FIND Med Name: CPAP with o2 bled in 2lpm    [provider]    Family History Family History  Problem Relation Age of Onset   Hypertension Mother    Hyperlipidemia Mother    Heart disease Father     Social History Social History[1]   Allergies   Lisinopril and Statins   Review of Systems Review of Systems  ROS negative except as noted in HPI above   Physical Exam Triage Vital Signs ED Triage  Vitals  Encounter Vitals Group     BP      Girls Systolic BP Percentile      Girls Diastolic BP Percentile      Boys Systolic BP Percentile      Boys Diastolic BP Percentile      Pulse      Resp      Temp      Temp src      SpO2      Weight      Height      Head Circumference      Peak Flow      Pain Score      Pain Loc      Pain Education      Exclude from Growth Chart    No data found.  Updated Vital Signs LMP  (  LMP Unknown)   Visual Acuity Right Eye Distance:   Left Eye Distance:   Bilateral Distance:    Right Eye Near:   Left Eye Near:    Bilateral Near:     Physical Exam  On inspection of right shoulder no evidence of erythema, ecchymosis, or edema present.  Denies any tenderness to palpation over bony landmarks of shoulder or right arm including but not limited to [facility] joint, acromion, right humerus, and bicipital groove of humerus.  Patient has limited range of motion of her right shoulder and unable to abduct shoulder past 90 degrees.  Has pain with end range of wall motion.  Has pain with passive abduction or flexion of the arm as well.  Neurovascularly intact distally with no radiation of pain into forearms/hands.  Unable to actively perform special testing due to inability to adequately position patient without pain.  Speed's Test does appear positive.  Concern for intrinsic rotator cuff weakness as well.  Patient had too much pain to adequately perform accurate physical exam.  UC Treatments / Results  Labs (all labs ordered are listed, but only abnormal results are displayed) Labs Reviewed - No data to display  EKG   Radiology No results found.  Procedures Procedures (including critical care time)  Medications Ordered in UC Medications - No data to display  Initial Impression / Assessment and Plan / UC Course  I have reviewed the triage vital signs and the nursing notes.  Pertinent labs & imaging results that were available during my care of the  patient were reviewed by me and considered in my medical decision making (see chart for details).     Right shoulder injury after low impact fall. Final Clinical Impressions(s) / UC Diagnoses   #Right shoulder injury -Extremity limited physical exam due to patient compliance; concern for rotator cuff injury versus biceps tendon injury. -Very low impact fall, no suspicion for fracture.  Will defer/decline x-ray at this time. -Rest right shoulder for the next 1-2 weeks Take meloxicam  daily for next 2 to 4 weeks as needed.  Do not take ibuprofen while taking this medication.  Can take Tylenol  up to 3 g daily for breakthrough pain.  Will also prescribe muscle relaxer to assist with sleep at night.  Do not recommend taking it during the day.  Do not drive or operate heavy machinery while taking this medication -Ice 2-3 times daily for next couple of days. -Try to do gentle range of motion exercises as demonstrated during visit today to prevent concurrent frozen shoulder from developing -Referral sent to sports medicine clinic dentistry.  Recommend following up with them in 1-2-week for further evaluation and consideration of diagnostic ultrasound. -Patient verbalizes understanding and agrees with treatment plan    Final diagnoses:  None   Discharge Instructions   None    ED Prescriptions   None    PDMP not reviewed this encounter.    [1]  Social History Tobacco Use   Smoking status: Never   Smokeless tobacco: Never  Vaping Use   Vaping status: Never Used  Substance Use Topics   Alcohol use: Yes    Comment: occ   Drug use: No     Lynwood Barter, DO 09/18/24 9050  "

## 2024-09-18 NOTE — Discharge Instructions (Addendum)
 You are diagnosed with rotator cuff injury versus biceps tendon injury.  Hard to differentiate while in acute pain with limited physical exam Rest right shoulder for the next 1-2 weeks Take meloxicam  daily for next 2 to 4 weeks as needed.  Do not take ibuprofen while taking this medication.  Can take Tylenol  up to 3 g daily for breakthrough pain.  Will also prescribe muscle relaxer to assist with sleep at night.  Do not recommend taking it during the day.  Do not drive or operate heavy machinery while taking this medication Ice 2-3 times daily for next couple of days. Try to do gentle range of motion exercises as demonstrated during visit today to prevent concurrent frozen shoulder from developing Referral sent to sports medicine clinic dentistry.  Recommend following up with them in 1-2-week for further evaluation and consideration of diagnostic ultrasound.

## 2024-10-09 ENCOUNTER — Telehealth: Payer: Self-pay | Admitting: Pharmacist

## 2024-10-09 DIAGNOSIS — E1165 Type 2 diabetes mellitus with hyperglycemia: Secondary | ICD-10-CM

## 2024-10-09 NOTE — Progress Notes (Addendum)
 "  10/10/2024 Name: Brandi Howell MRN: [id number] DOB: 23-Aug-1966  Chief Complaint  Patient presents with   Medication Management    Diabetes     Brandi Howell is a 59 y.o. year old female who presented for a telephone visit.   They were referred to the pharmacist by their PCP for assistance in managing diabetes.    Subjective:  Brandi Howell is a 59 year old female referred for diabetes management.  She has a past medical history significant for:  hypertension, seasonal allergies, type e diabetes, history of pulmonary embolism, asthma, diastolic heart failure, hyperlipidemia, vitamin D deficiency, and gout.   Care Team: Primary Care Provider: Jarold Medici, MD ; Next Scheduled Visit: 11/2024   Medication Access/Adherence  Current Pharmacy:  [id number] - [place], [place] - [address] AT [facility] OF [address] [place] [address] Phone: [phone] Fax: [phone]  [id number] [place],  - [address] AT [facility] [address] & [facility] [address] [place] [address] Phone: [phone] Fax: [phone]  [facility] [address], [address] [place] [address] Phone: [phone] Fax: [phone]  [facility] - [place], [place] - [address] [place] [address] Phone: [phone] Fax: [phone]   Patient reports affordability concerns with their medications: Yes --Uses Coupons For Ozempic   Patient reports access/transportation concerns to their pharmacy: No  Patient reports adherence concerns with their medications:  No     Diabetes:  Current medications:   Farxiga  10 mg Mounjaro  7.5 mg   Current glucose readings:  Patient was in the car at the time of our call and could not read off her blood sugars. She did say she felt like they were better but still not  where they were when she was on Ozempic .  Patient denies hypoglycemic s/sx including  dizziness, shakiness, sweating. Patient denies hyperglycemic symptoms including  polyuria, polydipsia, polyphagia, nocturia, neuropathy, blurred vision.  Macrovascular and Microvascular Risk Reduction:  Statin? yes (Pitavastatin  4 mg); ACEi/ARB? yes (Amlodipine /Olmesartan ) Last urinary albumin/creatinine ratio:  Lab Results  Component Value Date   MICRALBCREAT 103 (H) 08/30/2024   MICRALBCREAT 5 06/10/2023   MICRALBCREAT <5 04/27/2022   MICRALBCREAT Comment (A) 12/23/2021   MICRALBCREAT 30 12/17/2020   MICRALBCREAT 30-300 07/19/2019   Last eye exam:  Lab Results  Component Value Date   HMDIABEYEEXA No Retinopathy 05/19/2022   Last foot exam: No foot exam found Tobacco Use:  Tobacco Use: Low Risk (09/18/2024)   Patient History    Smoking Tobacco Use: Never    Smokeless Tobacco Use: Never    Passive Exposure: Not on file     Objective:  Lab Results  Component Value Date   HGBA1C 10.7 (H) 08/30/2024    Lab Results  Component Value Date   CREATININE 0.85 08/30/2024   BUN 16 08/30/2024   NA 140 08/30/2024   K 3.9 08/30/2024   CL 99 08/30/2024   CO2 25 08/30/2024    Lab Results  Component Value Date   CHOL 175 08/30/2024   HDL 49 08/30/2024   LDLCALC 87 08/30/2024   TRIG 234 (H) 08/30/2024   CHOLHDL 3.6 08/30/2024    Medications Reviewed Today     Reviewed by Jolee Cassius PARAS, [facility] (Pharmacist) on 10/10/24 at 0914  Med List Status: <None>   Medication  Order Taking? Sig Documenting Provider Last Dose Status Informant  albuterol  (VENTOLIN  HFA) 108 (90 Base) MCG/ACT inhaler [id number] Yes Inhale 2 puffs into the lungs every 6 (six) hours as needed for wheezing or shortness of breath. [provider]  Active   allopurinol  (ZYLOPRIM ) 100 MG tablet [id number]  Take 1 tablet (100 mg total) by mouth daily.  Patient not taking: Reported on 10/09/2024   Petrina Pries, NP  Expired  08/30/24 2359   amLODipine -olmesartan  (AZOR ) 10-40 MG tablet [id number] Yes TAKE 1 TABLET BY MOUTH DAILY Jarold Medici, MD  Active   aspirin  EC 81 MG tablet [id number] Yes Take 1 tablet (81 mg total) by mouth daily. Swallow whole. Lonni Slain, MD  Active   Blood Glucose Monitoring Suppl (ACCU-CHEK GUIDE ME) w/Device KIT [id number] Yes CHECK BLOOD SUGARS ONCE DAILY. Jarold Medici, MD  Active   budesonide -formoterol  (SYMBICORT ) 160-4.5 MCG/ACT inhaler [id number] Yes Inhale 2 puffs into the lungs as needed. Jarold Medici, MD  Active            Med Note ANTHONEY, ROBYN N   Wed Aug 30, 2024 11:23 AM) Pt states Pulmonary advised to take prn.   cetirizine (ZYRTEC) 10 MG tablet [id number] Yes Take 10 mg by mouth as needed for allergies. [provider]  Active Self  cyclobenzaprine  (FLEXERIL ) 10 MG tablet [id number] Yes Take 1 tablet (10 mg total) by mouth at bedtime as needed for muscle spasms. Lynwood Barter, DO  Active   FARXIGA  10 MG TABS tablet [id number] Yes TAKE 1 TABLET(10 MG) BY MOUTH DAILY BEFORE OFILIA Jarold Medici, MD  Active   furosemide  (LASIX ) 40 MG tablet [id number] Yes Take 1 tablet (40 mg total) by mouth daily. Jarold Medici, MD  Active   glucose blood (ACCU-CHEK GUIDE TEST) test strip [id number] Yes USE ONCE DAILY TO CHECK BLOOD SUGARS. Jarold Medici, MD  Active   glucose blood (CONTOUR NEXT TEST) test strip [id number] Yes 1 each by Other route as needed for other. Use as instructed to check blood sugars daily dx: e11.9 Jarold Medici, MD  Active   levocetirizine (XYZAL ) 5 MG tablet [id number] Yes Take 1 tablet (5 mg total) by mouth every evening. Jarold Medici, MD  Active   meloxicam  (MOBIC ) 15 MG tablet [id number] Yes Take 1 tablet (15 mg total) by mouth daily. Lynwood Barter, DO  Active   Microlet Lancets MISC [id number] Yes Use as instructed to check blood sugars daily dx: e11.9 Jarold Medici, MD  Active   montelukast  (SINGULAIR ) 10 MG tablet [id number] Yes TAKE 1 TABLET  BY MOUTH DAILY Jarold Medici, MD  Active   OXYGEN  [id number] Yes at bedtime. 2lpm 24/7 [facility] [provider]  Active   Pitavastatin  Calcium  4 MG TABS [id number] Yes Take 1 tablet (4 mg total) by mouth daily. Jarold Medici, MD  Active   spironolactone  (ALDACTONE ) 25 MG tablet [id number] Yes TAKE 1 TABLET(25 MG) BY MOUTH DAILY. NEED APPOINTMENT FOR REFILLS Jarold Medici, MD  Active   tirzepatide  (MOUNJARO ) 7.5 MG/0.5ML Pen [id number] Yes Inject 7.5 mg into the skin once a week. Jarold Medici, MD  Active   UNABLE TO FIND [id number]  Med Name: CPAP with o2 bled in 2lpm [provider]  Active                 09/18/2024    9:13 AM 08/30/2024   11:32 AM 08/30/2024   11:07 AM  Vitals with BMI  Height   5' 2  Weight   288 lbs 10  oz  BMI   52.77  Systolic 135 128 859  Diastolic 66 82 78  Pulse 75       Assessment/Plan:   Diabetes: - Currently uncontrolled; goal A1c <7%. Cardiorenal risk reduction is optimized.. Blood pressure is at goal <130/80. LDL is at goal. TG -Elevated - Increase Mounjaro  dose to 10 mg weekly.  (She reported just starting a new box of Mounjaro )  Follow Up Plan:    Send new prescription for Mounjaro  10 mg weekly to the [facility]'s [facility]-signature required.   Cassius DOROTHA Brought, PharmD, BCACP Clinical Pharmacist [phone]    "

## 2024-10-10 MED ORDER — MOUNJARO 10 MG/0.5ML ~~LOC~~ SOAJ: 10.0000 mg | SUBCUTANEOUS | 1 refills | Status: AC

## 2024-10-12 ENCOUNTER — Ambulatory Visit

## 2024-10-12 ENCOUNTER — Other Ambulatory Visit: Payer: Self-pay

## 2024-10-12 VITALS — BP 138/66 | Ht 63.5 in | Wt 280.0 lb

## 2024-10-12 DIAGNOSIS — M25511 Pain in right shoulder: Secondary | ICD-10-CM | POA: Diagnosis not present

## 2024-10-12 DIAGNOSIS — M67911 Unspecified disorder of synovium and tendon, right shoulder: Secondary | ICD-10-CM

## 2024-10-12 MED ORDER — TRAMADOL HCL 50 MG PO TABS: 50.0000 mg | ORAL_TABLET | Freq: Two times a day (BID) | ORAL | 0 refills | Status: AC | PRN

## 2024-10-12 NOTE — Progress Notes (Addendum)
 [facility] A Department of [facility]   PCP: Jarold Medici, MD  CHIEF COMPLAINT: Persistent right shoulder pain  HPI: Patient is a pleasant 59 y.o. female who presents today for evaluation of right shoulder pain.  Patient was referred by urgent care for left shoulder pain for further evaluation.  Initial injury occurred while at church on December 20 as she fell out of a chair and tried to catch herself with her right shoulder.  Did not experience a lot of immediate pain but within the next couple hours she had increasing disability and difficulty with range of motion of her right shoulder.  With urgent care she was prescribed meloxicam , Flexeril , Tylenol , and recommended to ice.  Patient says she has slowly regained range of motion from her right shoulder but still is having persistent pain.  Still extremely limited with activities of daily living and unable to do simple things like carrying groceries inside.  Requesting further workup at this time.  PMH:  Past Medical History:  Diagnosis Date   Asthma    Diabetes mellitus without complication (HCC)    Goiter    Gout    Hypercholesteremia    Hypertension    Vitamin D deficiency     Patient Active Problem List   Diagnosis Date Noted   Chronic pain of both knees 09/05/2024   Paresthesia 09/05/2024   Seasonal allergies 01/10/2024   Sinus congestion 10/02/2023   Annual physical exam 06/19/2023   Hypertensive heart disease with chronic diastolic congestive heart failure (HCC) 09/17/2020   Uncontrolled type 2 diabetes mellitus with hyperglycemia (HCC) 12/25/2019   History of pulmonary embolism 10/05/2018   OSA on CPAP 06/07/2018   Chronic diastolic heart failure (HCC) 04/07/2018   Class 3 severe obesity due to excess calories with serious comorbidity and body mass index (BMI) of 50.0 to 59.9 in adult (HCC) 04/07/2018   Cough variant asthma 04/06/2018   Chronic respiratory failure with  hypoxia and hypercapnia (HCC) 04/06/2018   Pulmonary infiltrates assoc with non-occlusive  PE  04/05/2018   (HFpEF) heart failure with preserved ejection fraction (HCC) 02/20/2018   Pneumonia 02/20/2018   Asthma 02/19/2018   Morbid obesity (HCC) 02/19/2018   Diabetes (HCC) 02/19/2018   Hyperlipidemia associated with type 2 diabetes mellitus (HCC) 02/19/2018   Goiter 02/19/2018   Gout 02/19/2018   Vitamin D deficiency 02/19/2018   Essential hypertension 02/19/2018   Acute medial meniscus tear of left knee 10/22/2017    PSurg:  Past Surgical History:  Procedure Laterality Date   CHOLECYSTECTOMY     KNEE ARTHROSCOPY     KNEE ARTHROSCOPY Left 10/25/2017   Procedure: LEFT KNEE ARTHROSCOPY WITH MEDIAL MENISECTOMY, CHONDROPLASTY;  Surgeon: Liam Lerner, MD;  Location: [facility] OR;  Service: Orthopedics;  Laterality: Left;   TUBAL LIGATION      Allergies: Lisinopril and Statins  Meds:  Previous Medications   ALBUTEROL  (VENTOLIN  HFA) 108 (90 BASE) MCG/ACT INHALER    Inhale 2 puffs into the lungs every 6 (six) hours as needed for wheezing or shortness of breath.   ALLOPURINOL  (ZYLOPRIM ) 100 MG TABLET    Take 1 tablet (100 mg total) by mouth daily.   AMLODIPINE -OLMESARTAN  (AZOR ) 10-40 MG TABLET    TAKE 1 TABLET BY MOUTH DAILY   ASPIRIN  EC 81 MG TABLET    Take 1 tablet (81 mg total) by mouth daily. Swallow whole.   BLOOD GLUCOSE MONITORING SUPPL (ACCU-CHEK GUIDE ME) W/DEVICE KIT  CHECK BLOOD SUGARS ONCE DAILY.   BUDESONIDE -FORMOTEROL  (SYMBICORT ) 160-4.5 MCG/ACT INHALER    Inhale 2 puffs into the lungs as needed.   CETIRIZINE (ZYRTEC) 10 MG TABLET    Take 10 mg by mouth as needed for allergies.   CYCLOBENZAPRINE  (FLEXERIL ) 10 MG TABLET    Take 1 tablet (10 mg total) by mouth at bedtime as needed for muscle spasms.   FARXIGA  10 MG TABS TABLET    TAKE 1 TABLET(10 MG) BY MOUTH DAILY BEFORE BREAKFAST   FUROSEMIDE  (LASIX ) 40 MG TABLET    Take 1 tablet (40 mg total) by mouth daily.   GLUCOSE BLOOD  (ACCU-CHEK GUIDE TEST) TEST STRIP    USE ONCE DAILY TO CHECK BLOOD SUGARS.   GLUCOSE BLOOD (CONTOUR NEXT TEST) TEST STRIP    1 each by Other route as needed for other. Use as instructed to check blood sugars daily dx: e11.9   LEVOCETIRIZINE (XYZAL ) 5 MG TABLET    Take 1 tablet (5 mg total) by mouth every evening.   MELOXICAM  (MOBIC ) 15 MG TABLET    Take 1 tablet (15 mg total) by mouth daily.   MICROLET LANCETS MISC    Use as instructed to check blood sugars daily dx: e11.9   MONTELUKAST  (SINGULAIR ) 10 MG TABLET    TAKE 1 TABLET BY MOUTH DAILY   OXYGEN     at bedtime. 2lpm 24/7 [facility]   PITAVASTATIN  CALCIUM  4 MG TABS    Take 1 tablet (4 mg total) by mouth daily.   SPIRONOLACTONE  (ALDACTONE ) 25 MG TABLET    TAKE 1 TABLET(25 MG) BY MOUTH DAILY. NEED APPOINTMENT FOR REFILLS   TIRZEPATIDE  (MOUNJARO ) 10 MG/0.5ML PEN    Inject 10 mg into the skin once a week.   UNABLE TO FIND    Med Name: CPAP with o2 bled in 2lpm    Social:  Social History   Tobacco Use   Smoking status: Never   Smokeless tobacco: Never  Substance Use Topics   Alcohol use: Yes    Comment: occ    REVIEW OF SYSTEMS:  ROS negative except as noted in HPI above   Objective Exam:  Vitals:   10/12/24 0857  BP: 138/66  Weight: 280 lb (127 kg)  Height: 5' 3.5 (1.613 m)    GENERAL: Patient is afebrile, Vital signs reviewed, well appearing, Patient appears comfortable, Alert and lucid. No apparent distress.   Physical Exam   Ortho Exam:  On inspection of right shoulder no evidence of erythema, ecchymosis, or edema present.  Mild TTP over bicipital groove.  Patient has limited range of motion of her right shoulder and unable to abduct shoulder past 120 degrees, and complete weakness with resisted external rotation.  Has pain with controlling arm let down after reaching overhead.   Neurovascularly intact distally with no radiation of pain into forearms/hands.  Empty can testing positive for weakness.  Cross-body adduction test  negative.  Yergason negative.  Speed test positive.  Concern for intrinsic rotator cuff weakness as well.  Patient had too much pain to adequately perform complete physical exam.  Limited ultrasound performed of patient's right shoulder but unable to obtain adequate diagnostic images given patient's body habitus and poor ultrasonic tissue penetration.  Was able to see presence of shoulder effusion at rotator cuff interval and presence of effusion surrounding biceps tendon along bicipital groove.  Will defer to MRI for official imaging report given poor quality of images obtained due to body habitus.  RESULTS:  Labs: No  results found for this or any previous visit (from the past 48 hours).  Imaging:  DG Shoulder Right    (Results Pending)  MR SHOULDER RIGHT WO CONTRAST    (Results Pending)    Assessment/Plan:  1. Acute pain of right shoulder   - Patient still having significant pain and intrinsic right shoulder weakness on testing - Will check x-ray to ensure no evidence of acute osseous abnormality - Will also order MRI for evaluation of right rotator cuff; suspect some degree of tearing present with intrinsic right shoulder weakness -Also prescribe #14 tramadol  for breakthrough pain at night that is keeping her up.  Can continue meloxicam , Flexeril , Tylenol , ice/heat as additional pain modalities. - Patient will follow-up in 3 weeks for reevaluation and to go over MRI results   New Prescriptions   No medications on file    Medications, medical history, allergies, surgical history, hospitalizations, family history, social history, ROS and vitals entered by nursing staff and reviewed by myself.  I discussed with the patient the diagnosis, treatment plan, indications for return to the emergency department, and for expected follow-up. The patient verbalized an understanding. The patient is asked if there are any questions or concerns. We discuss the case, until all issues are addressed to  the patient's satisfaction.  Follow up per instructions including returning for additional office visit if symptoms worsen or proceeding to the emergency department or urgent care in the next 12-24hrs if there is an acute concerning increasing symptoms, pain, fevers, or other symptoms.  Prentice Agent, DO  9:54 AM, 10/12/2024

## 2024-10-13 ENCOUNTER — Ambulatory Visit
Admission: RE | Admit: 2024-10-13 | Discharge: 2024-10-13 | Disposition: A | Discharge status: Home / Self Care | Source: Ambulatory Visit

## 2024-10-13 ENCOUNTER — Other Ambulatory Visit: Payer: Self-pay | Admitting: *Deleted

## 2024-10-13 ENCOUNTER — Telehealth: Payer: Self-pay

## 2024-10-13 DIAGNOSIS — M25511 Pain in right shoulder: Secondary | ICD-10-CM

## 2024-10-13 MED ORDER — MELOXICAM 15 MG PO TABS: 15.0000 mg | ORAL_TABLET | Freq: Every day | ORAL | 0 refills | Status: AC

## 2024-10-16 ENCOUNTER — Other Ambulatory Visit: Payer: Self-pay | Admitting: Internal Medicine

## 2024-10-17 ENCOUNTER — Ambulatory Visit: Admission: RE | Admit: 2024-10-17 | Discharge: 2024-10-17 | Disposition: A | Source: Ambulatory Visit

## 2024-10-31 ENCOUNTER — Telehealth: Payer: Self-pay

## 2024-10-31 ENCOUNTER — Other Ambulatory Visit: Payer: Self-pay

## 2024-10-31 DIAGNOSIS — E1165 Type 2 diabetes mellitus with hyperglycemia: Secondary | ICD-10-CM

## 2024-10-31 MED ORDER — DAPAGLIFLOZIN PROPANEDIOL 10 MG PO TABS: 10.0000 mg | ORAL_TABLET | Freq: Every day | ORAL | 2 refills | Status: AC

## 2024-12-04 ENCOUNTER — Ambulatory Visit: Admitting: Internal Medicine

## 2025-09-05 ENCOUNTER — Encounter: Payer: Self-pay | Admitting: Internal Medicine
